# Patient Record
Sex: Male | Born: 1955
Health system: Southern US, Community
[De-identification: ages and names within clinical notes are randomized; demographics above are authoritative.]

## PROBLEM LIST (undated history)

## (undated) DIAGNOSIS — K219 Gastro-esophageal reflux disease without esophagitis: Secondary | ICD-10-CM

## (undated) DIAGNOSIS — C4491 Basal cell carcinoma of skin, unspecified: Secondary | ICD-10-CM

## (undated) DIAGNOSIS — M1990 Unspecified osteoarthritis, unspecified site: Secondary | ICD-10-CM

## (undated) DIAGNOSIS — E785 Hyperlipidemia, unspecified: Secondary | ICD-10-CM

## (undated) DIAGNOSIS — T7840XA Allergy, unspecified, initial encounter: Secondary | ICD-10-CM

## (undated) DIAGNOSIS — M109 Gout, unspecified: Secondary | ICD-10-CM

## (undated) HISTORY — DX: Gastro-esophageal reflux disease without esophagitis: K21.9

## (undated) HISTORY — PX: BASAL CELL CARCINOMA EXCISION: SHX1214

## (undated) HISTORY — DX: Basal cell carcinoma of skin, unspecified: C44.91

## (undated) HISTORY — PX: VASECTOMY: SHX75

## (undated) HISTORY — DX: Gout, unspecified: M10.9

## (undated) HISTORY — DX: Hyperlipidemia, unspecified: E78.5

## (undated) HISTORY — PX: APPENDECTOMY: SHX54

## (undated) HISTORY — DX: Allergy, unspecified, initial encounter: T78.40XA

## (undated) HISTORY — DX: Unspecified osteoarthritis, unspecified site: M19.90

## (undated) HISTORY — PX: CERVICAL DISC SURGERY: SHX588

## (undated) HISTORY — PX: TONSILLECTOMY: SUR1361

## (undated) HISTORY — PX: WISDOM TOOTH EXTRACTION: SHX21

---

## 1998-11-22 ENCOUNTER — Encounter: Payer: Self-pay | Admitting: *Deleted

## 1998-11-22 ENCOUNTER — Emergency Department (HOSPITAL_COMMUNITY): Admission: AD | Admit: 1998-11-22 | Discharge: 1998-11-22 | Payer: Self-pay | Admitting: *Deleted

## 2009-06-19 ENCOUNTER — Encounter: Admission: RE | Admit: 2009-06-19 | Discharge: 2009-06-19 | Payer: Self-pay | Admitting: Family Medicine

## 2009-07-02 ENCOUNTER — Ambulatory Visit (HOSPITAL_COMMUNITY): Admission: RE | Admit: 2009-07-02 | Discharge: 2009-07-03 | Payer: Self-pay | Admitting: Neurological Surgery

## 2010-09-05 ENCOUNTER — Encounter: Payer: Self-pay | Admitting: Family Medicine

## 2010-11-17 LAB — PROTIME-INR
INR: 0.93 (ref 0.00–1.49)
Prothrombin Time: 12.4 seconds (ref 11.6–15.2)

## 2010-11-17 LAB — BASIC METABOLIC PANEL
BUN: 13 mg/dL (ref 6–23)
CO2: 26 mEq/L (ref 19–32)
Calcium: 8.8 mg/dL (ref 8.4–10.5)
Chloride: 105 mEq/L (ref 96–112)
Creatinine, Ser: 0.84 mg/dL (ref 0.4–1.5)
GFR calc Af Amer: >60 mL/min (ref >60)
GFR calc non Af Amer: >60 mL/min (ref >60)
Glucose, Bld: 84 mg/dL (ref 70–99)
Potassium: 4.2 mEq/L (ref 3.5–5.1)
Sodium: 137 mEq/L (ref 135–145)

## 2010-11-17 LAB — CBC
HCT: 48.6 % (ref 39.0–52.0)
Hemoglobin: 16.4 g/dL (ref 13.0–17.0)
MCHC: 33.7 g/dL (ref 30.0–36.0)
MCV: 93.1 fL (ref 78.0–100.0)
Platelets: 315 10*3/uL (ref 150–400)
RBC: 5.23 MIL/uL (ref 4.22–5.81)
RDW: 14 % (ref 11.5–15.5)
WBC: 8.8 10*3/uL (ref 4.0–10.5)

## 2010-11-17 LAB — APTT: aPTT: 32 seconds (ref 24–37)

## 2016-08-05 ENCOUNTER — Encounter: Payer: Self-pay | Admitting: Family Medicine

## 2016-08-05 ENCOUNTER — Ambulatory Visit (INDEPENDENT_AMBULATORY_CARE_PROVIDER_SITE_OTHER): Payer: BLUE CROSS/BLUE SHIELD | Admitting: Family Medicine

## 2016-08-05 VITALS — BP 132/94 | HR 71 | Temp 98.0°F | Ht 69.0 in | Wt 190.2 lb

## 2016-08-05 DIAGNOSIS — Z889 Allergy status to unspecified drugs, medicaments and biological substances status: Secondary | ICD-10-CM

## 2016-08-05 DIAGNOSIS — G8929 Other chronic pain: Secondary | ICD-10-CM

## 2016-08-05 DIAGNOSIS — N529 Male erectile dysfunction, unspecified: Secondary | ICD-10-CM

## 2016-08-05 DIAGNOSIS — E785 Hyperlipidemia, unspecified: Secondary | ICD-10-CM

## 2016-08-05 DIAGNOSIS — Z7689 Persons encountering health services in other specified circumstances: Secondary | ICD-10-CM | POA: Diagnosis not present

## 2016-08-05 DIAGNOSIS — M25562 Pain in left knee: Secondary | ICD-10-CM

## 2016-08-05 DIAGNOSIS — K219 Gastro-esophageal reflux disease without esophagitis: Secondary | ICD-10-CM | POA: Diagnosis not present

## 2016-08-05 DIAGNOSIS — M25561 Pain in right knee: Secondary | ICD-10-CM

## 2016-08-05 DIAGNOSIS — M109 Gout, unspecified: Secondary | ICD-10-CM | POA: Diagnosis not present

## 2016-08-05 MED ORDER — SILDENAFIL CITRATE 20 MG PO TABS: ORAL_TABLET | ORAL | 0 refills | Status: DC

## 2016-08-05 NOTE — Progress Notes (Signed)
Pre visit review using our clinic review tool, if applicable. No additional management support is needed unless otherwise documented below in the visit note. 

## 2016-08-05 NOTE — Patient Instructions (Addendum)
BEFORE YOU LEAVE: -recheck blood pressure  -xray sheet -review health maintenance and obtain reports to abstract -follow up: physical in 3 months  I will send the refill to the drugstore in [place].  Get the xray of the knee.  Try topical capsaicin sports creams for the knee pain  It was nice to meet you today!

## 2016-08-05 NOTE — Progress Notes (Signed)
HPI:  New patient. Used to see a doc in [place] but his doc retired. Had labs about 8 months ago.   HLD: -takes simvastatin, takes only 3 days per week -no hx CAD, DM, HTN -regular exercise, healthy diet - walks 5 miles per day  GERD: -hx hiatal hernia -take pantoprazole 40mg   -feels bad if misses doses  Gout: -on allopurinol, no flare since started a long time ago  Allergies: -sees Dr. Remus Blake -on allergy shot -flonase and aller-tec  ED: -uses cialis 20mg  as needed -wants rx sent to [facility]  Bilat knee pain: -for several years -better with activity, occ feels swollen -tolerable, not worsening  ROS negative for unless reported above: fevers, unintentional weight loss, hearing or vision loss, chest pain, palpitations, struggling to breath, hemoptysis, melena, hematochezia, hematuria, falls, loc, si, thoughts of self harm  Past Medical History:  Diagnosis Date  . Allergy   . Gout   . Hyperlipidemia     Past Surgical History:  Procedure Laterality Date  . APPENDECTOMY    . APPENDECTOMY    . CERVICAL DISC SURGERY    . TONSILLECTOMY      Family History  Problem Relation Age of Onset  . Arthritis Mother   . Arthritis Father   . Heart disease Father   . Cancer Sister     ovarian  . Diabetes Sister   . Kidney disease Cousin     Social History   Social History  . Marital status: Married    Spouse name: N/A  . Number of children: N/A  . Years of education: N/A   Social History Main Topics  . Smoking status: Never Smoker  . Smokeless tobacco: Never Used  . Alcohol use None     Comment: 2 - 5 days per week  . Drug use: No  . Sexual activity: Yes    Partners: Female   Other Topics Concern  . None   Social History Narrative   Work or School: [employer], 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      Home Situation: lives with wife      Spiritual Beliefs: Catholic      Lifestyle: regular exercise and healthy diet        Current  Outpatient Prescriptions:  .  allopurinol (ZYLOPRIM) 300 MG tablet, TAKE ONE TABLET BY MOUTH DAILY AS DIRECTED, Disp: , Rfl:  .  Cetirizine HCl (KLS ALLER-TEC PO), Take by mouth., Disp: , Rfl:  .  fluticasone (FLONASE) 50 MCG/ACT nasal spray, , Disp: , Rfl:  .  pantoprazole (PROTONIX) 40 MG tablet, TAKE 1 TABLET DAILY., Disp: , Rfl:  .  simvastatin (ZOCOR) 40 MG tablet, TAKE 1 TABLET BY MOUTH EVERY EVENING, Disp: , Rfl:  .  tadalafil (CIALIS) 20 MG tablet, Take 20 mg by mouth daily as needed for erectile dysfunction., Disp: , Rfl:  .  sildenafil (REVATIO) 20 MG tablet, Take 1-4 tablets as needed for sexual activity., Disp: 50 tablet, Rfl: 0  EXAM:  Vitals:   08/05/16 1508 08/05/16 1549  BP: (!) 136/92 (!) 132/94  Pulse: 71   Temp: 98 F (36.7 C)     Body mass index is 28.09 kg/m.  GENERAL: vitals reviewed and listed above, alert, oriented, appears well hydrated and in no acute distress  HEENT: atraumatic, conjunttiva clear, no obvious abnormalities on inspection of external nose and ears  NECK: no obvious masses on inspection  LUNGS: clear to auscultation bilaterally, no wheezes, rales or rhonchi, good air  movement  CV: HRRR, no peripheral edema  MS: moves all extremities without noticeable abnormality -mentioned the knee pain at the end of the visit as something he wanted to evaluate at a later appt - did brief exam - no apparent effusion today, no redness or rash, bilat + pat crepitus, neg lachman, neg mcmurry, neg val/var stress, NV intact bilat  PSYCH: pleasant and cooperative, no obvious depression or anxiety  ASSESSMENT AND PLAN:  Discussed the following assessment and plan:  Encounter to establish care  Erectile dysfunction, unspecified erectile dysfunction type  Gastroesophageal reflux disease, esophagitis presence not specified  Gout, unspecified cause, unspecified chronicity, unspecified site  H/O seasonal allergies - sees Dr. Orvil Feil for shots and  management  Hyperlipidemia, unspecified hyperlipidemia type  Chronic pain of both knees - Plan: DG Knee Complete 4 Views Right -We reviewed the PMH, PSH, FH, SH, Meds and Allergies. -We provided refills for any medications we will prescribe as needed. -We addressed current concerns per orders and patient instructions. -We have asked for records for pertinent exams, studies, vaccines and notes from previous providers. -We have advised patient to follow up per instructions below. -refills per request -recheck bp as leaving he reports it always is normal -xrays knee - suspect OA, tx options discussed -labs at physical per his preference -trial lower dose ppi if tolerated -Patient advised to return or notify a doctor immediately if symptoms worsen or persist or new concerns arise.  Patient Instructions  BEFORE YOU LEAVE: -recheck blood pressure  -xray sheet -review health maintenance and obtain reports to abstract -follow up: physical in 3 months  I will send the refill to the drugstore in [place].  Get the xray of the knee.  Try topical capsaicin sports creams for the knee pain  It was nice to meet you today!       Colin Benton R.

## 2016-08-07 DIAGNOSIS — G8929 Other chronic pain: Secondary | ICD-10-CM | POA: Insufficient documentation

## 2016-08-07 DIAGNOSIS — M109 Gout, unspecified: Secondary | ICD-10-CM | POA: Insufficient documentation

## 2016-08-07 DIAGNOSIS — Z889 Allergy status to unspecified drugs, medicaments and biological substances status: Secondary | ICD-10-CM | POA: Insufficient documentation

## 2016-08-07 DIAGNOSIS — M25561 Pain in right knee: Secondary | ICD-10-CM

## 2016-08-07 DIAGNOSIS — E785 Hyperlipidemia, unspecified: Secondary | ICD-10-CM | POA: Insufficient documentation

## 2016-08-07 DIAGNOSIS — M25562 Pain in left knee: Secondary | ICD-10-CM

## 2016-08-12 ENCOUNTER — Ambulatory Visit (INDEPENDENT_AMBULATORY_CARE_PROVIDER_SITE_OTHER)
Admission: RE | Admit: 2016-08-12 | Discharge: 2016-08-12 | Disposition: A | Discharge status: Home / Self Care | Payer: BLUE CROSS/BLUE SHIELD | Source: Ambulatory Visit | Attending: Family Medicine | Admitting: Family Medicine

## 2016-08-12 DIAGNOSIS — M25561 Pain in right knee: Secondary | ICD-10-CM

## 2016-08-12 DIAGNOSIS — M25562 Pain in left knee: Secondary | ICD-10-CM | POA: Diagnosis not present

## 2016-08-12 DIAGNOSIS — G8929 Other chronic pain: Secondary | ICD-10-CM

## 2016-08-18 ENCOUNTER — Encounter: Payer: Self-pay | Admitting: *Deleted

## 2016-08-19 ENCOUNTER — Ambulatory Visit: Payer: BLUE CROSS/BLUE SHIELD

## 2016-08-26 ENCOUNTER — Ambulatory Visit: Payer: BLUE CROSS/BLUE SHIELD | Admitting: *Deleted

## 2016-08-26 DIAGNOSIS — J301 Allergic rhinitis due to pollen: Secondary | ICD-10-CM | POA: Diagnosis not present

## 2016-08-26 DIAGNOSIS — J3081 Allergic rhinitis due to animal (cat) (dog) hair and dander: Secondary | ICD-10-CM | POA: Diagnosis not present

## 2016-08-26 NOTE — Progress Notes (Deleted)
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  BP reading was:  122/80-pulse 66 and the pt stated he has been checking this at work with numbers ranging 108/70. Dr Maudie Mercury was informed of this and I advised the pt to continue what he has been doing and follow up for his physical with is scheduled in 3 months.

## 2016-08-29 ENCOUNTER — Other Ambulatory Visit: Payer: Self-pay | Admitting: *Deleted

## 2016-08-29 DIAGNOSIS — J3089 Other allergic rhinitis: Secondary | ICD-10-CM | POA: Diagnosis not present

## 2016-08-29 MED ORDER — PANTOPRAZOLE SODIUM 40 MG PO TBEC: 40.0000 mg | DELAYED_RELEASE_TABLET | Freq: Every day | ORAL | 1 refills | Status: DC

## 2016-09-06 ENCOUNTER — Other Ambulatory Visit: Payer: Self-pay | Admitting: *Deleted

## 2016-09-06 MED ORDER — ALLOPURINOL 300 MG PO TABS: 300.0000 mg | ORAL_TABLET | Freq: Every day | ORAL | 3 refills | Status: DC

## 2016-09-06 MED ORDER — TADALAFIL 20 MG PO TABS: 20.0000 mg | ORAL_TABLET | Freq: Every day | ORAL | 3 refills | Status: DC | PRN

## 2016-09-21 ENCOUNTER — Encounter: Payer: Self-pay | Admitting: Family Medicine

## 2016-09-21 ENCOUNTER — Ambulatory Visit (INDEPENDENT_AMBULATORY_CARE_PROVIDER_SITE_OTHER): Payer: BLUE CROSS/BLUE SHIELD | Admitting: Family Medicine

## 2016-09-21 VITALS — BP 110/78 | HR 73 | Temp 97.9°F | Ht 69.0 in | Wt 192.0 lb

## 2016-09-21 DIAGNOSIS — M791 Myalgia, unspecified site: Secondary | ICD-10-CM

## 2016-09-21 DIAGNOSIS — J101 Influenza due to other identified influenza virus with other respiratory manifestations: Secondary | ICD-10-CM

## 2016-09-21 LAB — POCT INFLUENZA A/B
Influenza A, POC: POSITIVE — AB
Influenza B, POC: NEGATIVE

## 2016-09-21 MED ORDER — OSELTAMIVIR PHOSPHATE 75 MG PO CAPS: 75.0000 mg | ORAL_CAPSULE | Freq: Two times a day (BID) | ORAL | 0 refills | Status: DC

## 2016-09-21 NOTE — Progress Notes (Signed)
Subjective:     Patient ID: Stanley Bennett, male   DOB: 1956-06-28, 61 y.o.   MRN: [id number]  HPI Acute visit for flulike symptoms. He travels and was up in [place] last weekend. Late Sunday he developed some low-grade fever, nasal congestion, sore throat, ear fullness, cough, headaches, and body aches. No nausea or vomiting. Fevers are intermittent.  Past Medical History:  Diagnosis Date  . Allergy   . Gout   . Hyperlipidemia    Past Surgical History:  Procedure Laterality Date  . APPENDECTOMY    . APPENDECTOMY    . CERVICAL DISC SURGERY    . TONSILLECTOMY      reports that he has never smoked. He has never used smokeless tobacco. He reports that he does not use drugs. His alcohol history is not on file. family history includes Arthritis in his father and mother; Cancer in his sister; Diabetes in his sister; Heart disease in his father; Kidney disease in his cousin. No Known Allergies   Review of Systems  Constitutional: Positive for chills, fatigue and fever.  HENT: Positive for congestion and sore throat.   Respiratory: Positive for cough.   Gastrointestinal: Negative for nausea and vomiting.  Neurological: Positive for headaches.       Objective:   Physical Exam  Constitutional: He appears well-developed and well-nourished.  HENT:  Right Ear: External ear normal.  Left Ear: External ear normal.  Mouth/Throat: Oropharynx is clear and moist. No oropharyngeal exudate.  Neck: Neck supple.  Cardiovascular: Normal rate and regular rhythm.   Pulmonary/Chest: Effort normal and breath sounds normal. No respiratory distress. He has no wheezes. He has no rales.  Lymphadenopathy:    He has no cervical adenopathy.       Assessment:     Febrile illness. Suspect influenza. Patient requesting testing=positive for Influenza A    Plan:     -Tamiflu 75 mg po bid for 5 days -push fluids -follow up for any persistent or worsening symptoms.  Eulas Post MD [place]  [facility] at [facility]

## 2016-09-21 NOTE — Progress Notes (Signed)
Pre visit review using our clinic review tool, if applicable. No additional management support is needed unless otherwise documented below in the visit note. 

## 2016-09-21 NOTE — Patient Instructions (Signed)

## 2016-09-26 ENCOUNTER — Ambulatory Visit (INDEPENDENT_AMBULATORY_CARE_PROVIDER_SITE_OTHER): Payer: BLUE CROSS/BLUE SHIELD | Admitting: Family Medicine

## 2016-09-26 ENCOUNTER — Encounter: Payer: Self-pay | Admitting: Family Medicine

## 2016-09-26 VITALS — BP 118/80 | HR 60 | Temp 97.7°F | Ht 69.0 in | Wt 184.0 lb

## 2016-09-26 DIAGNOSIS — R21 Rash and other nonspecific skin eruption: Secondary | ICD-10-CM

## 2016-09-26 NOTE — Progress Notes (Signed)
HPI:  Acute visit for rash. Dx with influenza last week and took tamiflu. Rash started after 2 days on tamiflu - itchy rash on trunk. Reports otherwise feeling much better with resolution fevers, malaise, body aches. No SOB, wheezing, mouth or face swelling. Rash not worsening.  ROS: See pertinent positives and negatives per HPI.  Past Medical History:  Diagnosis Date  . Allergy   . Gout   . Hyperlipidemia     Past Surgical History:  Procedure Laterality Date  . APPENDECTOMY    . APPENDECTOMY    . CERVICAL DISC SURGERY    . TONSILLECTOMY      Family History  Problem Relation Age of Onset  . Arthritis Mother   . Arthritis Father   . Heart disease Father   . Cancer Sister     ovarian  . Diabetes Sister   . Kidney disease Cousin     Social History   Social History  . Marital status: Married    Spouse name: N/A  . Number of children: N/A  . Years of education: N/A   Social History Main Topics  . Smoking status: Never Smoker  . Smokeless tobacco: Never Used  . Alcohol use None     Comment: 2 - 5 days per week  . Drug use: No  . Sexual activity: Yes    Partners: Female   Other Topics Concern  . None   Social History Narrative   Work or School: [employer], 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      Home Situation: lives with wife      Spiritual Beliefs: Catholic      Lifestyle: regular exercise and healthy diet        Current Outpatient Prescriptions:  .  allopurinol (ZYLOPRIM) 300 MG tablet, Take 1 tablet (300 mg total) by mouth daily. as directed, Disp: 90 tablet, Rfl: 3 .  Cetirizine HCl (KLS ALLER-TEC PO), Take by mouth., Disp: , Rfl:  .  fluticasone (FLONASE) 50 MCG/ACT nasal spray, , Disp: , Rfl:  .  pantoprazole (PROTONIX) 40 MG tablet, Take 1 tablet (40 mg total) by mouth daily., Disp: 90 tablet, Rfl: 1 .  sildenafil (REVATIO) 20 MG tablet, Take 1-4 tablets as needed for sexual activity., Disp: 50 tablet, Rfl: 0 .  simvastatin (ZOCOR) 40 MG  tablet, TAKE 1 TABLET BY MOUTH EVERY EVENING, Disp: , Rfl:  .  tadalafil (CIALIS) 20 MG tablet, Take 1 tablet (20 mg total) by mouth daily as needed for erectile dysfunction., Disp: 10 tablet, Rfl: 3  EXAM:  Vitals:   09/26/16 1454  BP: 118/80  Pulse: 60  Temp: 97.7 F (36.5 C)    Body mass index is 27.17 kg/m.  GENERAL: vitals reviewed and listed above, alert, oriented, appears well hydrated and in no acute distress  HEENT: atraumatic, conjunttiva clear, no obvious abnormalities on inspection of external nose and ears  NECK: no obvious masses on inspection  LUNGS: clear to auscultation bilaterally, no wheezes, rales or rhonchi, good air movement  CV: HRRR, no peripheral edema  SKIN: mild papular erythematous rash on trunk only - no vesicles or peeling  MS: moves all extremities without noticeable abnormality  PSYCH: pleasant and cooperative, no obvious depression or anxiety  ASSESSMENT AND PLAN:  Discussed the following assessment and plan:  Rash  -given mild skin findings and feeling well otherwise suspect mild drug reaction and opted to stop tamiflu as has only one more dose and monitor -zyrtec daily -Patient advised to  return or notify a doctor immediately if symptoms worsen or persist or new concerns arise.  There are no Patient Instructions on file for this visit.  Colin Benton R., DO

## 2016-09-26 NOTE — Progress Notes (Signed)
Pre visit review using our clinic review tool, if applicable. No additional management support is needed unless otherwise documented below in the visit note. 

## 2016-09-29 ENCOUNTER — Encounter: Payer: Self-pay | Admitting: Family Medicine

## 2016-09-29 ENCOUNTER — Ambulatory Visit (INDEPENDENT_AMBULATORY_CARE_PROVIDER_SITE_OTHER): Payer: BLUE CROSS/BLUE SHIELD | Admitting: Family Medicine

## 2016-09-29 VITALS — BP 100/70 | HR 62 | Temp 97.4°F | Ht 69.0 in | Wt 185.1 lb

## 2016-09-29 DIAGNOSIS — M25561 Pain in right knee: Secondary | ICD-10-CM | POA: Diagnosis not present

## 2016-09-29 MED ORDER — NAPROXEN 500 MG PO TABS: 500.0000 mg | ORAL_TABLET | Freq: Two times a day (BID) | ORAL | 0 refills | Status: DC

## 2016-09-29 NOTE — Progress Notes (Signed)
Pre visit review using our clinic review tool, if applicable. No additional management support is needed unless otherwise documented below in the visit note. 

## 2016-09-29 NOTE — Progress Notes (Signed)
HPI:  R knee pain: -PMH sig for chronic knee issues an dgout -worsening R knee pain for 5 days  -xrays about 1 month ago -no swelling, redness, fevers, malaise, weakness, popping or giving away  ROS: See pertinent positives and negatives per HPI.  Past Medical History:  Diagnosis Date  . Allergy   . Gout   . Hyperlipidemia     Past Surgical History:  Procedure Laterality Date  . APPENDECTOMY    . APPENDECTOMY    . CERVICAL DISC SURGERY    . TONSILLECTOMY      Family History  Problem Relation Age of Onset  . Arthritis Mother   . Arthritis Father   . Heart disease Father   . Cancer Sister     ovarian  . Diabetes Sister   . Kidney disease Cousin     Social History   Social History  . Marital status: Married    Spouse name: N/A  . Number of children: N/A  . Years of education: N/A   Social History Main Topics  . Smoking status: Never Smoker  . Smokeless tobacco: Never Used  . Alcohol use None     Comment: 2 - 5 days per week  . Drug use: No  . Sexual activity: Yes    Partners: Female   Other Topics Concern  . None   Social History Narrative   Work or School: [employer], 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      Home Situation: lives with wife      Spiritual Beliefs: Catholic      Lifestyle: regular exercise and healthy diet        Current Outpatient Prescriptions:  .  allopurinol (ZYLOPRIM) 300 MG tablet, Take 1 tablet (300 mg total) by mouth daily. as directed, Disp: 90 tablet, Rfl: 3 .  Cetirizine HCl (KLS ALLER-TEC PO), Take by mouth., Disp: , Rfl:  .  fluticasone (FLONASE) 50 MCG/ACT nasal spray, , Disp: , Rfl:  .  pantoprazole (PROTONIX) 40 MG tablet, Take 1 tablet (40 mg total) by mouth daily., Disp: 90 tablet, Rfl: 1 .  sildenafil (REVATIO) 20 MG tablet, Take 1-4 tablets as needed for sexual activity., Disp: 50 tablet, Rfl: 0 .  simvastatin (ZOCOR) 40 MG tablet, TAKE 1 TABLET BY MOUTH EVERY EVENING, Disp: , Rfl:  .  tadalafil (CIALIS) 20  MG tablet, Take 1 tablet (20 mg total) by mouth daily as needed for erectile dysfunction., Disp: 10 tablet, Rfl: 3 .  naproxen (NAPROSYN) 500 MG tablet, Take 1 tablet (500 mg total) by mouth 2 (two) times daily with a meal., Disp: 14 tablet, Rfl: 0  EXAM:  Vitals:   09/29/16 1122  BP: 100/70  Pulse: 62  Temp: 97.4 F (36.3 C)    Body mass index is 27.33 kg/m.  GENERAL: vitals reviewed and listed above, alert, oriented, appears well hydrated and in no acute distress  HEENT: atraumatic, conjunttiva clear, no obvious abnormalities on inspection of external nose and ears  NECK: no obvious masses on inspection  LUNGS: clear to auscultation bilaterally, no wheezes, rales or rhonchi, good air movement  CV: HRRR, no peripheral edema  MS: moves all extremities without noticeable abnormality, normal inspection both knees without redness or swelling, normal gait and normal ROM both knees, TTP is very focal and over R MCL, no laxity on val/var testing, lachman and mcmurray neg, normal exam otherwise and NV intact distal  PSYCH: pleasant and cooperative, no obvious depression or anxiety  ASSESSMENT AND  PLAN:  Discussed the following assessment and plan:  Recurrent pain of right knee  -we discussed possible serious and likely etiologies, workup and treatment, treatment risks and return precautions -after this discussion, Javien opted for NSAIDs, ice and close monitoring; eval with ortho urgent care (options discussed) if worsening and with sports med or ortho if persists -of course, we advised Quinnten  to return or notify a doctor immediately if symptoms worsen or persist or new concerns arise.  Patient Instructions  Ice  Naproxen per instructions as needed for pain  Referral to sports med or orthopedic walk in clinic evaluation if worsening or not improving next 7 days.   Colin Benton R., DO

## 2016-09-29 NOTE — Patient Instructions (Signed)
Ice  Naproxen per instructions as needed for pain  Referral to sports med or orthopedic walk in clinic evaluation if worsening or not improving next 7 days.

## 2016-10-06 DIAGNOSIS — J3081 Allergic rhinitis due to animal (cat) (dog) hair and dander: Secondary | ICD-10-CM | POA: Diagnosis not present

## 2016-10-06 DIAGNOSIS — J301 Allergic rhinitis due to pollen: Secondary | ICD-10-CM | POA: Diagnosis not present

## 2016-10-06 DIAGNOSIS — J3089 Other allergic rhinitis: Secondary | ICD-10-CM | POA: Diagnosis not present

## 2016-10-06 DIAGNOSIS — L209 Atopic dermatitis, unspecified: Secondary | ICD-10-CM | POA: Diagnosis not present

## 2016-11-15 ENCOUNTER — Encounter: Payer: BLUE CROSS/BLUE SHIELD | Admitting: Family Medicine

## 2016-12-05 NOTE — Progress Notes (Signed)
HPI:  Here for CPE:  New concern Vertigo: -intermittent for about 3 months -thinks had prior episodes -brief spells triggered by position changes -no fevers, HA, malaise, wt loss, hearing changes, vision changes, vomiting, CP, palpitations  -Concerns and/or follow up today:   HLD: -takes simvastatin, takes only 3 days per week -no hx CAD, DM, HTN -regular exercise, healthy diet - walks 5 miles per day  GERD: -hx hiatal hernia -take pantoprazole 40mg   -feels bad if misses doses  Gout: -on allopurinol, no flare since started a long time ago -needs refills  Allergies: -sees Dr. Remus Blake -on allergy shot -flonase and aller-tec  ED: -uses cialis 20mg  as needed  Bilat knee pain: -for several years -better with activity, occ feels swollen -tolerable, not worsening  -Diet: variety of foods, balance and well rounded, larger portion sizes  -Exercise: no regular exercise  -Diabetes and Dyslipidemia Screening: FASTING for labs  -Hx of HTN: no  -Vaccines: UTD  -sexual activity: yes, male partner, no new partners  -wants STI testing, Hep C screening (if born 81-1967): no  -FH colon or prstate ca: see FH Last colon cancer screening: discussed options, reports normal colonoscopy at 76, wants to do cologard Last prostate ca screening: discussed risks/benefits DRE and PSA, he declined DRE but does wish to do the PSA  -Alcohol, Tobacco, drug use: see social history  Review of Systems - no fevers, unintentional weight loss, vision loss, hearing loss, chest pain, sob, hemoptysis, melena, hematochezia, hematuria, genital discharge, changing or concerning skin lesions, bleeding, bruising, loc, thoughts of self harm or SI  Past Medical History:  Diagnosis Date  . Allergy   . Gout   . Hyperlipidemia     Past Surgical History:  Procedure Laterality Date  . APPENDECTOMY    . APPENDECTOMY    . CERVICAL DISC SURGERY    . TONSILLECTOMY      Family History   Problem Relation Age of Onset  . Arthritis Mother   . Arthritis Father   . Heart disease Father   . Cancer Sister     ovarian  . Diabetes Sister   . Kidney disease Cousin     Social History   Social History  . Marital status: Married    Spouse name: N/A  . Number of children: N/A  . Years of education: N/A   Social History Main Topics  . Smoking status: Never Smoker  . Smokeless tobacco: Never Used  . Alcohol use None     Comment: 2 - 5 days per week  . Drug use: No  . Sexual activity: Yes    Partners: Female   Other Topics Concern  . None   Social History Narrative   Work or School: [employer], 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      Home Situation: lives with wife      Spiritual Beliefs: Catholic      Lifestyle: regular exercise and healthy diet        Current Outpatient Prescriptions:  .  allopurinol (ZYLOPRIM) 300 MG tablet, Take 1 tablet (300 mg total) by mouth daily. as directed, Disp: 90 tablet, Rfl: 3 .  Cetirizine HCl (KLS ALLER-TEC PO), Take by mouth., Disp: , Rfl:  .  fluticasone (FLONASE) 50 MCG/ACT nasal spray, , Disp: , Rfl:  .  naproxen (NAPROSYN) 500 MG tablet, Take 1 tablet (500 mg total) by mouth 2 (two) times daily with a meal., Disp: 14 tablet, Rfl: 0 .  pantoprazole (PROTONIX) 40 MG tablet,  Take 1 tablet (40 mg total) by mouth daily., Disp: 90 tablet, Rfl: 1 .  sildenafil (REVATIO) 20 MG tablet, Take 1-4 tablets as needed for sexual activity., Disp: 50 tablet, Rfl: 0 .  simvastatin (ZOCOR) 40 MG tablet, TAKE 1 TABLET BY MOUTH EVERY EVENING, Disp: , Rfl:  .  tadalafil (CIALIS) 20 MG tablet, Take 1 tablet (20 mg total) by mouth daily as needed for erectile dysfunction., Disp: 10 tablet, Rfl: 3  EXAM:  Vitals:   12/06/16 1127  BP: 120/80  Pulse: 60  Temp: 97.5 F (36.4 C)  TempSrc: Oral  Weight: 183 lb 8 oz (83.2 kg)  Height: 5' 9.25" (1.759 m)    Estimated body mass index is 26.9 kg/m as calculated from the following:   Height as  of this encounter: 5' 9.25" (1.759 m).   Weight as of this encounter: 183 lb 8 oz (83.2 kg).  GENERAL: vitals reviewed and listed below, alert, oriented, appears well hydrated and in no acute distress  HEENT: head atraumatic, PERRLA, normal appearance of eyes, ears, nose and mouth. moist mucus membranes.  NECK: supple, no masses or lymphadenopathy, no bruit  LUNGS: clear to auscultation bilaterally, no rales, rhonchi or wheeze  CV: HRRR, no peripheral edema or cyanosis, normal pedal pulses  ABDOMEN: bowel sounds normal, soft, non tender to palpation, no masses, no rebound or guarding  GU: normal appearance of external genitalia - no lesions or masses, hernia exam normal.  RECTAL: refused  SKIN: no rash or abnormal lesions  MS: normal gait, moves all extremities normally  NEURO: normal gait, speech and thought processing grossly intact, muscle tone grossly intact throughout, CN II-XII grossly intact, finger to nose normal, speech and thought processing grossly intact, dix hallpike + to the R  PSYCH: normal affect, pleasant and cooperative  ASSESSMENT AND PLAN:  Discussed the following assessment and plan:  Visit for preventive health examination - Plan: PSA, Hemoglobin U2P, Basic metabolic panel  Vertigo - Plan: meclizine (ANTIVERT) 25 MG tablet, Ambulatory referral to Physical Therapy -we discussed possible serious and likely etiologies, workup and treatment, treatment risks and return precautions; query BPPV -after this discussion, Ervine opted for meclizine, vestibular rehab -follow up advised 1 month with further eval if not resolving as expected with appropriate tx -of course, we advised Styles  to return or notify a doctor immediately if symptoms worsen or persist or new concerns arise.  Chronic pain of both knees - Plan: naproxen (NAPROSYN) 500 MG tablet -requested refill on naproxen, uses rarely for knee pain  Hyperlipidemia, unspecified hyperlipidemia type - Plan:  Lipid panel Labs, lifestyle recs, cont current tx  Gastroesophageal reflux disease, esophagitis presence not specified -cont current tx  Gout, unspecified cause, unspecified chronicity, unspecified site -refills, labs, cont current tx  hep c screening - Plan: Hepatitis C antibody   He had a new concern that required sig time and exam above and beyond physical, prescription medication and referral.  -Discussed and advised all [place] preventive services health task force level A and B recommendations for age, sex and risks.  --Advised at least 150 minutes of exercise per week and a healthy diet with avoidance of (less then 1 serving per week) processed foods, white starches, red meat, fast foods and sweets and consisting of: * 5-9 servings of fresh fruits and vegetables (not corn or potatoes) *nuts and seeds, beans *olives and olive oil *lean meats such as fish and white chicken  *whole grains  -FASTING labs, studies and vaccines per  orders this encounter  -assistant advised to order cologard for colon ca screening per his preference  -risks/benefits options for prostate ca screening discussed, he opted for PSA    Patient advised to return to clinic immediately if symptoms worsen or persist or new concerns.  There are no Patient Instructions on file for this visit.  No Follow-up on file.   Colin Benton R., DO

## 2016-12-06 ENCOUNTER — Telehealth: Payer: Self-pay | Admitting: Family Medicine

## 2016-12-06 ENCOUNTER — Ambulatory Visit (INDEPENDENT_AMBULATORY_CARE_PROVIDER_SITE_OTHER): Payer: BLUE CROSS/BLUE SHIELD | Admitting: Family Medicine

## 2016-12-06 ENCOUNTER — Encounter: Payer: Self-pay | Admitting: Family Medicine

## 2016-12-06 VITALS — BP 120/80 | HR 60 | Temp 97.5°F | Ht 69.25 in | Wt 183.5 lb

## 2016-12-06 DIAGNOSIS — R42 Dizziness and giddiness: Secondary | ICD-10-CM

## 2016-12-06 DIAGNOSIS — Z0000 Encounter for general adult medical examination without abnormal findings: Secondary | ICD-10-CM

## 2016-12-06 DIAGNOSIS — Z7289 Other problems related to lifestyle: Secondary | ICD-10-CM | POA: Diagnosis not present

## 2016-12-06 DIAGNOSIS — G8929 Other chronic pain: Secondary | ICD-10-CM | POA: Diagnosis not present

## 2016-12-06 DIAGNOSIS — M25562 Pain in left knee: Secondary | ICD-10-CM

## 2016-12-06 DIAGNOSIS — K219 Gastro-esophageal reflux disease without esophagitis: Secondary | ICD-10-CM | POA: Diagnosis not present

## 2016-12-06 DIAGNOSIS — M25561 Pain in right knee: Secondary | ICD-10-CM | POA: Diagnosis not present

## 2016-12-06 DIAGNOSIS — E785 Hyperlipidemia, unspecified: Secondary | ICD-10-CM | POA: Diagnosis not present

## 2016-12-06 DIAGNOSIS — M109 Gout, unspecified: Secondary | ICD-10-CM

## 2016-12-06 LAB — LIPID PANEL
Cholesterol: 203 mg/dL — ABNORMAL HIGH (ref 0–200)
HDL: 74.5 mg/dL (ref >39.00)
LDL Cholesterol: 111 mg/dL — ABNORMAL HIGH (ref 0–99)
NonHDL: 128.45
Total CHOL/HDL Ratio: 3
Triglycerides: 87 mg/dL (ref 0.0–149.0)
VLDL: 17.4 mg/dL (ref 0.0–40.0)

## 2016-12-06 LAB — BASIC METABOLIC PANEL
BUN: 13 mg/dL (ref 6–23)
CO2: 28 mEq/L (ref 19–32)
Calcium: 9.2 mg/dL (ref 8.4–10.5)
Chloride: 101 mEq/L (ref 96–112)
Creatinine, Ser: 0.95 mg/dL (ref 0.40–1.50)
GFR: 85.23 mL/min (ref >60.00)
Glucose, Bld: 82 mg/dL (ref 70–99)
Potassium: 4.3 mEq/L (ref 3.5–5.1)
Sodium: 137 mEq/L (ref 135–145)

## 2016-12-06 LAB — HEMOGLOBIN A1C: Hgb A1c MFr Bld: 6.1 % (ref 4.6–6.5)

## 2016-12-06 LAB — PSA: PSA: 1.57 ng/mL (ref 0.10–4.00)

## 2016-12-06 MED ORDER — ALLOPURINOL 300 MG PO TABS: 300.0000 mg | ORAL_TABLET | Freq: Every day | ORAL | 3 refills | Status: DC

## 2016-12-06 MED ORDER — PANTOPRAZOLE SODIUM 40 MG PO TBEC: 40.0000 mg | DELAYED_RELEASE_TABLET | Freq: Every day | ORAL | 3 refills | Status: DC

## 2016-12-06 MED ORDER — SIMVASTATIN 40 MG PO TABS: 40.0000 mg | ORAL_TABLET | Freq: Every evening | ORAL | 3 refills | Status: DC

## 2016-12-06 MED ORDER — MECLIZINE HCL 25 MG PO TABS: 25.0000 mg | ORAL_TABLET | Freq: Three times a day (TID) | ORAL | 0 refills | Status: DC | PRN

## 2016-12-06 MED ORDER — NAPROXEN 500 MG PO TABS: 500.0000 mg | ORAL_TABLET | Freq: Two times a day (BID) | ORAL | 0 refills | Status: DC

## 2016-12-06 NOTE — Telephone Encounter (Signed)
Rx done. 

## 2016-12-06 NOTE — Telephone Encounter (Signed)
Pt called back and do not need the allopurinol he needs Rx for pantoprazole #90   Pharm:  [facility] on [address]

## 2016-12-06 NOTE — Patient Instructions (Addendum)
BEFORE YOU LEAVE: -follow up: 1 month for vertigo -order cologard -labs  Meclizine as needed per instruction for vertigo. We placed a referral for you as discussed for the therapy for the vertigo. It usually takes about 1-2 weeks to process and schedule this referral. If you have not heard from [place] regarding this appointment in 2 weeks please contact our office. Do not drive when you have vertigo.  We have ordered labs or studies at this visit. It can take up to 1-2 weeks for results and processing. IF results require follow up or explanation, we will call you with instructions. Clinically stable results will be released to your [facility]. If you have not heard from [place] or cannot find your results in [facility] in 2 weeks please contact our office at [phone].  If you are not yet signed up for [facility], please consider signing up.   We recommend the following healthy lifestyle for LIFE: 1) Small portions.   Tip: eat off of a salad plate instead of a dinner plate.  Tip: if you need more or a snack choose fruits, veggies and/or a handful of nuts or seeds.  2) Eat a healthy clean diet.  * Tip: Avoid (less then 1 serving per week): processed foods, sweets, sweetened drinks, white starches (rice, flour, bread, potatoes, pasta, etc), red meat, fast foods, butter  *Tip: CHOOSE instead   * 5-9 servings per day of fresh or frozen fruits and vegetables (but not corn, potatoes, bananas, canned or dried fruit)   *nuts and seeds, beans   *olives and olive oil   *small portions of lean meats such as fish and white chicken    *small portions of whole grains  3)Get at least 150 minutes of sweaty aerobic exercise per week.  4)Reduce stress - consider counseling, meditation and relaxation to balance other aspects of your life.

## 2016-12-06 NOTE — Progress Notes (Signed)
Pre visit review using our clinic review tool, if applicable. No additional management support is needed unless otherwise documented below in the visit note. 

## 2016-12-07 LAB — HEPATITIS C ANTIBODY: HCV Ab: NEGATIVE

## 2016-12-19 ENCOUNTER — Ambulatory Visit
Payer: BLUE CROSS/BLUE SHIELD | Attending: Family Medicine | Admitting: Rehabilitative and Restorative Service Providers"

## 2016-12-19 DIAGNOSIS — H8111 Benign paroxysmal vertigo, right ear: Secondary | ICD-10-CM

## 2016-12-19 NOTE — Therapy (Signed)
[facility] [address] [place], [place], [address] Phone: [phone]   Fax:  [phone]  Physical Therapy Evaluation  Patient Details  Name: Stanley Bennett MRN: [id number] Date of Birth: 1955/11/07 Referring Provider: Colin Benton, DO  Encounter Date: 12/19/2016      PT End of Session - 12/19/16 1309    Visit Number 1   Number of Visits 4   Date for PT Re-Evaluation 01/18/17   Authorization Type private insurance   PT Start Time 5176   PT Stop Time 1315   PT Time Calculation (min) 40 min   Activity Tolerance Patient tolerated treatment well   Behavior During Therapy [facility] for tasks assessed/performed      Past Medical History:  Diagnosis Date  . Allergy   . Gout   . Hyperlipidemia     Past Surgical History:  Procedure Laterality Date  . APPENDECTOMY    . APPENDECTOMY    . CERVICAL DISC SURGERY    . TONSILLECTOMY      There were no vitals filed for this visit.       Subjective Assessment - 12/19/16 1239    Subjective The patient noted onset of dizziness in 2010 after neck surgery.  He reports he has intermittent episodes that typically resolve on their own.  He describes dizziness when looking up or having to put his head back (painting in a small space).  This episode began February and he limited mobility for a day or two.  "It doesn't bother me when I walk unless I turn quickly."   He describes a quick spinning sensation with nausea.     Pertinent History h/o neck surgery   Patient Stated Goals Get rid of vertigo.   Currently in Pain? Yes  intermittent pains in neck and back   Effect of Pain on Daily Activities The patient has h/o chronic pain that PT will monitor, but no goals to follow.            [facility] PT Assessment - 12/19/16 1243      Assessment   Medical Diagnosis vertigo   Referring Provider Colin Benton, DO   Onset Date/Surgical Date --  09/2016   Prior Therapy none     Precautions   Precaution Comments h/o cervical surgery     Restrictions   Weight Bearing Restrictions No     Balance Screen   Has the patient fallen in the past 6 months No   Has the patient had a decrease in activity level because of a fear of falling?  No   Is the patient reluctant to leave their home because of a fear of falling?  No     Home Environment   Living Environment Private residence     Prior Function   Level of Independence Independent     Observation/Other Assessments   Focus on Therapeutic Outcomes (FOTO)  86%   Other Surveys  Other Surveys   Dizziness Handicap Inventory [facility])  30%            Vestibular Assessment - 12/19/16 1244      Vestibular Assessment   General Observation The patient walks independently into clinic.     Symptom Behavior   Type of Dizziness Spinning   Frequency of Dizziness daily   Duration of Dizziness seconds   Aggravating Factors Looking up to the ceiling   Relieving Factors Head stationary     Occulomotor Exam   Occulomotor Alignment Normal  Spontaneous Absent   Gaze-induced Absent   Smooth Pursuits Intact     Vestibulo-Occular Reflex   VOR 1 Head Only (x 1 viewing) normal with patient able to maintain gaze fixation     Positional Testing   Dix-Hallpike Dix-Hallpike Right;Dix-Hallpike Left   Horizontal Canal Testing Horizontal Canal Right;Horizontal Canal Left     Dix-Hallpike Right   Dix-Hallpike Right Duration 20 seconds   Dix-Hallpike Right Symptoms Upbeat, right rotatory nystagmus  large amplitude nystagmus     Dix-Hallpike Left   Dix-Hallpike Left Duration mild/ lasts for 10 seconds   Dix-Hallpike Left Symptoms Upbeat, left rotatory nystagmus     Horizontal Canal Right   Horizontal Canal Right Duration viewed upbeating, rotary nystagmus from posterior canal BPPV     Horizontal Canal Left   Horizontal Canal Left Duration none   Horizontal Canal Left Symptoms Normal                Vestibular  Treatment/Exercise - 12/19/16 1301      Vestibular Treatment/Exercise   Vestibular Treatment Provided Canalith Repositioning;Habituation   Canalith Repositioning Epley Manuever Right;Canal Roll Right   Habituation Exercises Horizontal Roll      EPLEY MANUEVER RIGHT   Number of Reps  1   Overall Response Symptoms Worsened   Response Details  Nystagmus converted to geotropic indicating otoconia repositioned into horiz canal     Canal Roll Right   Number of Reps  1   Overall Response  Improved Symptoms   Response Details  Patient continued with geotropic nystagmus, but smaller amplitude.  PT instructed in HEP for habituation due to continued symptoms (to begin in 2 days if symptoms present)     Horizontal Roll   Symptom Description  Provided handout and picture for home.               PT Education - 12/19/16 1459    Education provided Yes   Education Details HEP: habituation to begin in 2 days; handout from [email or web address]   Person(s) Educated Patient   Methods Explanation;Demonstration   Comprehension Verbalized understanding             PT Long Term Goals - 12/19/16 1310      PT LONG TERM GOAL #1   Title The patient will be indep with HEP for self mgmt of BPPV.   Baseline Target date 01/19/17   Time 4   Period Weeks     PT LONG TERM GOAL #2   Title The patient will be negative for positional testing indicating resolution of BPPV.   Baseline Target date 01/19/17   Time 4   Period Weeks     PT LONG TERM GOAL #3   Title The patient will improve DHI from 30% to < or equal to15% to demo dec'd self perception of dizziness.   Baseline Target date 01/19/17   Time 4   Period Weeks               Plan - 12/19/16 1503    Clinical Impression Statement The patient is a 61 yo male presenting to PT with multi-canal BPPV (initially in R posterior canal as most severe, mild in L posterior canal).  He has h/o neck surgery and had mild tightness noted during Epley's  maneuver.  Patient developed geotropic nystagmus after Epley's indicating repositioning into horizontal canal R.  Treated with canolith maneuver for R horiz canal.  PT let patient rest t /o due to c/o nausea and overall "hot,  clammy" feeling with treatment.     Rehab Potential Good   PT Frequency 1x / week  eval +   PT Duration 4 weeks   PT Treatment/Interventions Canalith Repostioning;Vestibular;Neuromuscular re-education;Patient/family education   PT Next Visit Plan Check multi-canal BPPV   Consulted and Agree with Plan of Care Patient      Patient will benefit from skilled therapeutic intervention in order to improve the following deficits and impairments:  Dizziness, Decreased balance  Visit Diagnosis: BPPV (benign paroxysmal positional vertigo), right     Problem List Patient Active Problem List   Diagnosis Date Noted  . Gout 08/07/2016  . H/O seasonal allergies 08/07/2016  . Hyperlipidemia 08/07/2016  . Chronic pain of both knees 08/07/2016  . Erectile dysfunction 08/05/2016  . Gastroesophageal reflux disease 08/05/2016    Zayden Maffei, PT 12/19/2016, 3:13 PM  [facility] [address] [place], [place], [address] Phone: [phone]   Fax:  [phone]  Name: Stanley Bennett MRN: [id number] Date of Birth: 1956-06-29

## 2016-12-19 NOTE — Patient Instructions (Signed)
Habituation - Tip Card  1.The goal of habituation training is to assist in decreasing symptoms of vertigo, dizziness, or nausea provoked by specific head and body motions. 2.These exercises may initially increase symptoms; however, be persistent and work through symptoms. With repetition and time, the exercises will assist in reducing or eliminating symptoms. 3.Exercises should be stopped and discussed with the therapist if you experience any of the following: - Sudden change or fluctuation in hearing - New onset of ringing in the ears, or increase in current intensity - Any fluid discharge from the ear - Severe pain in neck or back - Extreme nausea  Copyright  VHI. All rights reserved.   Habituation - Rolling   With pillow under head, start on back. Roll to your right side.  Hold until dizziness stops, plus 20 seconds and then roll to the left side.  Hold until dizziness stops, plus 20 seconds.  Repeat sequence 5 times per session. Do 2 sessions per day.  Copyright  VHI. All rights reserved.

## 2016-12-23 ENCOUNTER — Ambulatory Visit: Payer: BLUE CROSS/BLUE SHIELD | Admitting: Rehabilitative and Restorative Service Providers"

## 2016-12-23 DIAGNOSIS — H8111 Benign paroxysmal vertigo, right ear: Secondary | ICD-10-CM

## 2016-12-23 NOTE — Therapy (Signed)
[facility] [address] [place], [place], [address] Phone: [phone]   Fax:  [phone]  Physical Therapy Treatment  Patient Details  Name: Stanley Bennett MRN: [id number] Date of Birth: [birth year] Referring Provider: Colin Benton, DO  Encounter Date: 12/23/2016      PT End of Session - 12/23/16 1143    Visit Number 2   Number of Visits 4   Date for PT Re-Evaluation 01/18/17   Authorization Type private insurance   PT Start Time 1110   PT Stop Time 1140   PT Time Calculation (min) 30 min   Activity Tolerance Patient tolerated treatment well   Behavior During Therapy [facility] for tasks assessed/performed      Past Medical History:  Diagnosis Date  . Allergy   . Gout   . Hyperlipidemia     Past Surgical History:  Procedure Laterality Date  . APPENDECTOMY    . APPENDECTOMY    . CERVICAL DISC SURGERY    . TONSILLECTOMY      There were no vitals filed for this visit.      Subjective Assessment - 12/23/16 1112    Subjective The patient went home Monday and slept for 2 hours.  He reports symptoms are so much better and he is able to look up for the first time. He is still noting some mild dizziness with quick horizontal head motion (room doesn't spin, the screens look like they move a little bit).   Pertinent History h/o neck surgery   Patient Stated Goals Get rid of vertigo.   Currently in Pain? No/denies                Vestibular Assessment - 12/23/16 1117      Positional Testing   Dix-Hallpike Dix-Hallpike Right;Dix-Hallpike Left   Horizontal Canal Testing Horizontal Canal Right;Horizontal Canal Left     Dix-Hallpike Left   Dix-Hallpike Left Duration mild nystagmus viewed--cannot tell direction in room light     Horizontal Canal Right   Horizontal Canal Right Symptoms Ageotrophic     Horizontal Canal Left   Horizontal Canal Left Duration greater amplitude/intensity of nystagmus to  the left side indicating R horizontal cupulolithiasis.   Horizontal Canal Left Symptoms Ageotrophic                  Vestibular Treatment/Exercise - 12/23/16 0001      Vestibular Treatment/Exercise   Vestibular Treatment Provided Canalith Repositioning;Habituation   Canalith Repositioning Canal Roll Right  Kim maneuver, 2011   Habituation Exercises Horizontal Roll     Canal Roll Right   Number of Reps  2   Overall Response  Improved Symptoms  mild sensation of dizziness with small amplitude nystagmus   Response Details  Patient continued with midl apogeotropic nystagmus.  Then performed gufonia for HC cupulo (lied down R sidelying nose at neutral x 30 sec, then nose to ground x 2-3 minutes, then return to sitting).     Horizontal Roll   Number of Reps  3   Symptom Description  reviewed for HEP, as patient did not complete since evaluation on Monday.               PT Education - 12/23/16 1142    Education provided Yes   Education Details recommended continuation of horizontal rolling for habituation   Person(s) Educated Patient   Methods Explanation;Demonstration;Handout   Comprehension Returned demonstration  PT Long Term Goals - 12/19/16 1310      PT LONG TERM GOAL #1   Title The patient will be indep with HEP for self mgmt of BPPV.   Baseline Target date 01/19/17   Time 4   Period Weeks     PT LONG TERM GOAL #2   Title The patient will be negative for positional testing indicating resolution of BPPV.   Baseline Target date 01/19/17   Time 4   Period Weeks     PT LONG TERM GOAL #3   Title The patient will improve DHI from 30% to < or equal to15% to demo dec'd self perception of dizziness.   Baseline Target date 01/19/17   Time 4   Period Weeks               Plan - 12/23/16 1143    Clinical Impression Statement The patient has subjective improvement reporting no true spinning, but more a sensation of head pressure.  Positional  testing positive for apogeotropic nystagmus with horizontal rolling R<>L (worse on left side) indicating R horiozntal cupulolithiasis.  PT treated with 2 maneuvers today and provided HEP if any symptoms remain.  Plan to f/u in 2 weeks.    PT Treatment/Interventions Canalith Repostioning;Vestibular;Neuromuscular re-education;Patient/family education   PT Next Visit Plan Check multi-canal BPPV   Consulted and Agree with Plan of Care Patient      Patient will benefit from skilled therapeutic intervention in order to improve the following deficits and impairments:  Dizziness, Decreased balance  Visit Diagnosis: BPPV (benign paroxysmal positional vertigo), right     Problem List Patient Active Problem List   Diagnosis Date Noted  . Gout 08/07/2016  . H/O seasonal allergies 08/07/2016  . Hyperlipidemia 08/07/2016  . Chronic pain of both knees 08/07/2016  . Erectile dysfunction 08/05/2016  . Gastroesophageal reflux disease 08/05/2016    Romanda Turrubiates, PT 12/23/2016, 11:45 AM  [facility] [address] [place], [place], [address] Phone: [phone]   Fax:  [phone]  Name: Stanley Bennett MRN: [id number] Date of Birth: 1956-07-29

## 2016-12-30 ENCOUNTER — Encounter: Payer: Self-pay | Admitting: Family Medicine

## 2016-12-30 ENCOUNTER — Ambulatory Visit (INDEPENDENT_AMBULATORY_CARE_PROVIDER_SITE_OTHER): Payer: BLUE CROSS/BLUE SHIELD | Admitting: Family Medicine

## 2016-12-30 VITALS — BP 110/80 | HR 63 | Temp 98.2°F | Ht 69.25 in | Wt 186.0 lb

## 2016-12-30 DIAGNOSIS — J069 Acute upper respiratory infection, unspecified: Secondary | ICD-10-CM

## 2016-12-30 MED ORDER — BENZONATATE 100 MG PO CAPS: 100.0000 mg | ORAL_CAPSULE | Freq: Two times a day (BID) | ORAL | 0 refills | Status: DC | PRN

## 2016-12-30 NOTE — Patient Instructions (Signed)
INSTRUCTIONS FOR UPPER RESPIRATORY INFECTION:  -plenty of rest and fluids  -nasal saline wash 2-3 times daily (use prepackaged nasal saline or bottled/distilled water if making your own)   -can use AFRIN nasal spray for drainage and nasal congestion - but do NOT use longer then 3-4 days  -can use tylenol (in no history of liver disease) or ibuprofen (if no history of kidney disease, bowel bleeding or significant heart disease) as directed for aches and sorethroat  -in the winter time, using a humidifier at night is helpful (please follow cleaning instructions)  -if you are taking a cough medication - use only as directed, may also try a teaspoon of honey to coat the throat and throat lozenges. I sent the Tessalon to the pharmacy.  -for sore throat, salt water gargles can help  -follow up if you have fevers, facial pain, tooth pain, difficulty breathing or are worsening or symptoms persist longer then expected  Upper Respiratory Infection, Adult An upper respiratory infection (URI) is also known as the common cold. It is often caused by a type of germ (virus). Colds are easily spread (contagious). You can pass it to others by kissing, coughing, sneezing, or drinking out of the same glass. Usually, you get better in 1 to 3  weeks.  However, the cough can last for even longer. HOME CARE   Only take medicine as told by your doctor. Follow instructions provided above.  Drink enough water and fluids to keep your pee (urine) clear or pale yellow.  Get plenty of rest.  Return to work when your temperature is < 100 for 24 hours or as told by your doctor. You may use a face mask and wash your hands to stop your cold from spreading. GET HELP RIGHT AWAY IF:   After the first few days, you feel you are getting worse.  You have questions about your medicine.  You have chills, shortness of breath, or red spit (mucus).  You have pain in the face for more then 1-2 days, especially when you bend  forward.  You have a fever, puffy (swollen) neck, pain when you swallow, or white spots in the back of your throat.  You have a bad headache, ear pain, sinus pain, or chest pain.  You have a high-pitched whistling sound when you breathe in and out (wheezing).  You cough up blood.  You have sore muscles or a stiff neck. MAKE SURE YOU:   Understand these instructions.  Will watch your condition.  Will get help right away if you are not doing well or get worse. Document Released: 01/18/2008 Document Revised: 10/24/2011 Document Reviewed: 11/06/2013 [facility] Patient Information 2015 [place], [place]. This information is not intended to replace advice given to you by your health care provider. Make sure you discuss any questions you have with your health care provider.

## 2016-12-30 NOTE — Progress Notes (Signed)
HPI:  URI -started: 3-4 days ago -symptoms:nasal congestion, sore throat, cough, HA, PND -denies:fever, SOB, NVD, tooth pain, rash, body aches -has tried: flonase -sick contacts/travel/risks: no reported flu, strep or tick exposure -Hx of: allergies  ROS: See pertinent positives and negatives per HPI.  Past Medical History:  Diagnosis Date  . Allergy   . Gout   . Hyperlipidemia     Past Surgical History:  Procedure Laterality Date  . APPENDECTOMY    . APPENDECTOMY    . CERVICAL DISC SURGERY    . TONSILLECTOMY      Family History  Problem Relation Age of Onset  . Arthritis Mother   . Arthritis Father   . Heart disease Father   . Cancer Sister        ovarian  . Diabetes Sister   . Kidney disease Cousin     Social History   Social History  . Marital status: Married    Spouse name: N/A  . Number of children: N/A  . Years of education: N/A   Social History Main Topics  . Smoking status: Never Smoker  . Smokeless tobacco: Never Used  . Alcohol use None     Comment: 2 - 5 days per week  . Drug use: No  . Sexual activity: Yes    Partners: Female   Other Topics Concern  . None   Social History Narrative   Work or School: [employer], 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      Home Situation: lives with wife      Spiritual Beliefs: Catholic      Lifestyle: regular exercise and healthy diet        Current Outpatient Prescriptions:  .  allopurinol (ZYLOPRIM) 300 MG tablet, Take 1 tablet (300 mg total) by mouth daily. as directed, Disp: 90 tablet, Rfl: 3 .  Cetirizine HCl (KLS ALLER-TEC PO), Take by mouth., Disp: , Rfl:  .  fluticasone (FLONASE) 50 MCG/ACT nasal spray, , Disp: , Rfl:  .  meclizine (ANTIVERT) 25 MG tablet, Take 1 tablet (25 mg total) by mouth 3 (three) times daily as needed for dizziness., Disp: 30 tablet, Rfl: 0 .  naproxen (NAPROSYN) 500 MG tablet, Take 1 tablet (500 mg total) by mouth 2 (two) times daily with a meal., Disp: 14 tablet,  Rfl: 0 .  pantoprazole (PROTONIX) 40 MG tablet, Take 1 tablet (40 mg total) by mouth daily., Disp: 90 tablet, Rfl: 3 .  sildenafil (REVATIO) 20 MG tablet, Take 1-4 tablets as needed for sexual activity., Disp: 50 tablet, Rfl: 0 .  simvastatin (ZOCOR) 40 MG tablet, Take 1 tablet (40 mg total) by mouth every evening., Disp: 90 tablet, Rfl: 3 .  tadalafil (CIALIS) 20 MG tablet, Take 1 tablet (20 mg total) by mouth daily as needed for erectile dysfunction., Disp: 10 tablet, Rfl: 3 .  benzonatate (TESSALON) 100 MG capsule, Take 1 capsule (100 mg total) by mouth 2 (two) times daily as needed for cough., Disp: 20 capsule, Rfl: 0  EXAM:  Vitals:   12/30/16 1301  BP: 110/80  Pulse: 63  Temp: 98.2 F (36.8 C)    Body mass index is 27.27 kg/m.  GENERAL: vitals reviewed and listed above, alert, oriented, appears well hydrated and in no acute distress  HEENT: atraumatic, conjunttiva clear, no obvious abnormalities on inspection of external nose and ears, normal appearance of ear canals and TMs, clear nasal congestion, mild post oropharyngeal erythema with PND, no tonsillar edema or exudate, no  sinus TTP  NECK: no obvious masses on inspection  LUNGS: clear to auscultation bilaterally, no wheezes, rales or rhonchi, good air movement  CV: HRRR, no peripheral edema  MS: moves all extremities without noticeable abnormality  PSYCH: pleasant and cooperative, no obvious depression or anxiety  ASSESSMENT AND PLAN:  Discussed the following assessment and plan:  Viral upper respiratory tract infection  -given HPI and exam findings today, a serious infection or illness is unlikely. We discussed potential etiologies, with VURI being most likely, and advised Tessalon for cough, allergy regimen and supportive care and monitoring. We discussed treatment side effects, likely course, antibiotic misuse, transmission, and signs of developing a serious illness. -of course, we advised to return or notify a  doctor immediately if symptoms worsen or persist or new concerns arise.    Patient Instructions  INSTRUCTIONS FOR UPPER RESPIRATORY INFECTION:  -plenty of rest and fluids  -nasal saline wash 2-3 times daily (use prepackaged nasal saline or bottled/distilled water if making your own)   -can use AFRIN nasal spray for drainage and nasal congestion - but do NOT use longer then 3-4 days  -can use tylenol (in no history of liver disease) or ibuprofen (if no history of kidney disease, bowel bleeding or significant heart disease) as directed for aches and sorethroat  -in the winter time, using a humidifier at night is helpful (please follow cleaning instructions)  -if you are taking a cough medication - use only as directed, may also try a teaspoon of honey to coat the throat and throat lozenges. I sent the Tessalon to the pharmacy.  -for sore throat, salt water gargles can help  -follow up if you have fevers, facial pain, tooth pain, difficulty breathing or are worsening or symptoms persist longer then expected  Upper Respiratory Infection, Adult An upper respiratory infection (URI) is also known as the common cold. It is often caused by a type of germ (virus). Colds are easily spread (contagious). You can pass it to others by kissing, coughing, sneezing, or drinking out of the same glass. Usually, you get better in 1 to 3  weeks.  However, the cough can last for even longer. HOME CARE   Only take medicine as told by your doctor. Follow instructions provided above.  Drink enough water and fluids to keep your pee (urine) clear or pale yellow.  Get plenty of rest.  Return to work when your temperature is < 100 for 24 hours or as told by your doctor. You may use a face mask and wash your hands to stop your cold from spreading. GET HELP RIGHT AWAY IF:   After the first few days, you feel you are getting worse.  You have questions about your medicine.  You have chills, shortness of  breath, or red spit (mucus).  You have pain in the face for more then 1-2 days, especially when you bend forward.  You have a fever, puffy (swollen) neck, pain when you swallow, or white spots in the back of your throat.  You have a bad headache, ear pain, sinus pain, or chest pain.  You have a high-pitched whistling sound when you breathe in and out (wheezing).  You cough up blood.  You have sore muscles or a stiff neck. MAKE SURE YOU:   Understand these instructions.  Will watch your condition.  Will get help right away if you are not doing well or get worse. Document Released: 01/18/2008 Document Revised: 10/24/2011 Document Reviewed: 11/06/2013 ExitCare Patient Information 2015  [employer]. This information is not intended to replace advice given to you by your health care provider. Make sure you discuss any questions you have with your health care provider.    Colin Benton R., DO

## 2017-01-06 ENCOUNTER — Ambulatory Visit: Payer: BLUE CROSS/BLUE SHIELD | Admitting: Rehabilitative and Restorative Service Providers"

## 2017-01-06 DIAGNOSIS — H8111 Benign paroxysmal vertigo, right ear: Secondary | ICD-10-CM | POA: Diagnosis not present

## 2017-01-06 NOTE — Patient Instructions (Signed)
Habituation - Tip Card  1.The goal of habituation training is to assist in decreasing symptoms of vertigo, dizziness, or nausea provoked by specific head and body motions. 2.These exercises may initially increase symptoms; however, be persistent and work through symptoms. With repetition and time, the exercises will assist in reducing or eliminating symptoms. 3.Exercises should be stopped and discussed with the therapist if you experience any of the following: - Sudden change or fluctuation in hearing - New onset of ringing in the ears, or increase in current intensity - Any fluid discharge from the ear - Severe pain in neck or back - Extreme nausea  Copyright  VHI. All rights reserved.   Habituation - Sit to Side-Lying   Sit on edge of bed. Lie down onto the right side and hold until dizziness stops, plus 20 seconds.  Return to sitting and wait until dizziness stops, plus 20 seconds.  Repeat to the left side. Repeat sequence 5 times per session. Do 2 sessions per day.  Copyright  VHI. All rights reserved.    Gaze Stabilization - Tip Card  1.Target must remain in focus, not blurry, and appear stationary while head is in motion. 2.Perform exercises with small head movements (45 to either side of midline). 3.Increase speed of head motion so long as target is in focus. 4.If you wear eyeglasses, be sure you can see target through lens (therapist will give specific instructions for bifocal / progressive lenses). 5.These exercises may provoke dizziness or nausea. Work through these symptoms. If too dizzy, slow head movement slightly. Rest between each exercise. 6.Exercises demand concentration; avoid distractions. 7.For safety, perform standing exercises close to a counter, wall, corner, or next to someone.  Copyright  VHI. All rights reserved.   Gaze Stabilization - Standing Feet Apart   Feet shoulder width apart, keeping eyes on target on wall 3 feet away, tilt head down slightly and  move head side to side for 30 seconds. *Increase to 60 seconds as tolerated. Do 2-3 sessions per day.  *Remember to move your head not your shoulders. Copyright  VHI. All rights reserved.

## 2017-01-06 NOTE — Therapy (Signed)
[facility] [address] [place], [place], [address] Phone: [phone]   Fax:  [phone]  Physical Therapy Treatment  Patient Details  Name: Stanley Bennett MRN: [id number] Date of Birth: Jan 06, 1956 Referring Provider: Colin Benton, DO  Encounter Date: 01/06/2017      PT End of Session - 01/06/17 1248    Visit Number 3   Number of Visits 4   Date for PT Re-Evaluation 01/18/17   Authorization Type private insurance   PT Start Time 1229   PT Stop Time 1248   PT Time Calculation (min) 19 min   Activity Tolerance Patient tolerated treatment well   Behavior During Therapy [facility] for tasks assessed/performed      Past Medical History:  Diagnosis Date  . Allergy   . Gout   . Hyperlipidemia     Past Surgical History:  Procedure Laterality Date  . APPENDECTOMY    . APPENDECTOMY    . CERVICAL DISC SURGERY    . TONSILLECTOMY      There were no vitals filed for this visit.      Subjective Assessment - 01/06/17 1229    Subjective The patient notes that he is able to bend forward, look up and do things that he couldn't do before.  He still notes "minor" sensation of sporadic dizziness that lasts for a couple of seconds with a quick/unexpected turn.   He occasionally gets a sensation when lying supine in the back of his head described as pressure.     Pertinent History h/o neck surgery   Patient Stated Goals Get rid of vertigo.   Currently in Pain? No/denies                Vestibular Assessment - 01/06/17 1235      Positional Testing   Dix-Hallpike Dix-Hallpike Right;Dix-Hallpike Left   Horizontal Canal Testing Horizontal Canal Right;Horizontal Canal Left     Dix-Hallpike Right   Dix-Hallpike Right Duration Trace of symptom reported that is described as "brief" in duration and "mild" in intensity.    Dix-Hallpike Right Symptoms No nystagmus     Dix-Hallpike Left   Dix-Hallpike Left Duration none    Dix-Hallpike Left Symptoms No nystagmus     Horizontal Canal Right   Horizontal Canal Right Duration none   Horizontal Canal Right Symptoms Ageotrophic  trace- <5 beats     Horizontal Canal Left   Horizontal Canal Left Duration none   Horizontal Canal Left Symptoms Normal                  Vestibular Treatment/Exercise - 01/06/17 1238      Vestibular Treatment/Exercise   Vestibular Treatment Provided Gaze;Habituation   Habituation Exercises Laruth Bouchard Daroff;Horizontal Roll   Gaze Exercises X1 Viewing Horizontal     Nestor Lewandowsky   Number of Reps  2   Symptom Description  Trace of head pressure, no nystagmus noted.     X1 Viewing Horizontal   Foot Position standing feet apart   Comments 30 seconds with cues for visual fixation and slowing speed with visual blurring               PT Education - 01/06/17 1248    Education provided Yes   Education Details HEP: habituation sit<>sidelying; gaze x 1 viewing   Person(s) Educated Patient   Methods Explanation;Demonstration;Handout   Comprehension Verbalized understanding;Returned demonstration             PT Long Term Goals -  01/06/17 1248      PT LONG TERM GOAL #1   Title The patient will be indep with HEP for self mgmt of BPPV.   Baseline Target date 01/19/17   Time 4   Period Weeks   Status Achieved     PT LONG TERM GOAL #2   Title The patient will be negative for positional testing indicating resolution of BPPV.   Baseline Target date 01/19/17   Time 4   Period Weeks   Status Achieved     PT LONG TERM GOAL #3   Title The patient will improve DHI from 30% to < or equal to15% to demo dec'd self perception of dizziness.   Baseline Target date 01/19/17  *sent FOTO survey to e-mail on5/25/18   Time 4   Period Weeks               Plan - 01/06/17 1251    Clinical Impression Statement The patient met 2 LTGs.   There are no true nystagmus viewed with positional testing, but a "trace" of  sensation.  Patient also noted some sensation of intermittent symptoms with quick turns.  PT provided gaze x 1 viewing to address.    PT Treatment/Interventions Canalith Repostioning;Vestibular;Neuromuscular re-education;Patient/family education   PT Next Visit Plan discharge if no phone f/u call *plan to send d/c in 2 weeks-- patient to call if symptoms return or if ?s about HEP.   Consulted and Agree with Plan of Care Patient      Patient will benefit from skilled therapeutic intervention in order to improve the following deficits and impairments:  Dizziness, Decreased balance  Visit Diagnosis: BPPV (benign paroxysmal positional vertigo), right     Problem List Patient Active Problem List   Diagnosis Date Noted  . Gout 08/07/2016  . H/O seasonal allergies 08/07/2016  . Hyperlipidemia 08/07/2016  . Chronic pain of both knees 08/07/2016  . Erectile dysfunction 08/05/2016  . Gastroesophageal reflux disease 08/05/2016    Daryan Cagley, PT 01/06/2017, 12:53 PM  [facility] [address] [place], [place], [address] Phone: [phone]   Fax:  [phone]  Name: ASAEL PANN MRN: [id number] Date of Birth: 09-29-1955

## 2017-01-24 ENCOUNTER — Encounter: Payer: Self-pay | Admitting: Rehabilitative and Restorative Service Providers"

## 2017-01-24 NOTE — Therapy (Signed)
[facility] [address] [place], [place], [address] Phone: [phone]   Fax:  [phone]  Patient Details  Name: Stanley Bennett MRN: [id number] Date of Birth: [birth year] Referring Provider:  Colin Benton, DO  Encounter Date: last encounter 01/06/17  PHYSICAL THERAPY DISCHARGE SUMMARY  Visits from Start of Care: 3  Current functional level related to goals / functional outcomes:       PT Long Term Goals - 01/06/17 1248      PT LONG TERM GOAL #1   Title The patient will be indep with HEP for self mgmt of BPPV.   Baseline Target date 01/19/17   Time 4   Period Weeks   Status Achieved     PT LONG TERM GOAL #2   Title The patient will be negative for positional testing indicating resolution of BPPV.   Baseline Target date 01/19/17   Time 4   Period Weeks   Status Achieved     PT LONG TERM GOAL #3   Title The patient will improve DHI from 30% to < or equal to15% to demo dec'd self perception of dizziness.   Baseline Target date 01/19/17  *sent FOTO survey to e-mail on5/25/18   Time 4   Period Weeks        Remaining deficits: Patient called to note dizziness resolved, therefore did not need another session.   Education / Equipment: HEP, habituation, self care for recurrent BPPV mgmt.  Plan: Patient agrees to discharge.  Patient goals were not met. Patient is being discharged due to meeting the stated rehab goals.  ?????        Thank you for the referral of this patient. Rudell Cobb, MPT      Stanley Bennett 01/24/2017, 10:07 AM  [facility] [address] [place], [place], [address] Phone: [phone]   Fax:  [phone]

## 2017-02-21 ENCOUNTER — Encounter: Payer: Self-pay | Admitting: Family Medicine

## 2017-03-06 ENCOUNTER — Ambulatory Visit (INDEPENDENT_AMBULATORY_CARE_PROVIDER_SITE_OTHER): Payer: BLUE CROSS/BLUE SHIELD | Admitting: Family Medicine

## 2017-03-06 ENCOUNTER — Encounter: Payer: Self-pay | Admitting: Family Medicine

## 2017-03-06 VITALS — BP 100/58 | HR 59 | Temp 97.8°F | Ht 69.25 in | Wt 181.5 lb

## 2017-03-06 DIAGNOSIS — R197 Diarrhea, unspecified: Secondary | ICD-10-CM

## 2017-03-06 DIAGNOSIS — M545 Low back pain: Secondary | ICD-10-CM | POA: Diagnosis not present

## 2017-03-06 DIAGNOSIS — M25561 Pain in right knee: Secondary | ICD-10-CM

## 2017-03-06 DIAGNOSIS — R194 Change in bowel habit: Secondary | ICD-10-CM

## 2017-03-06 DIAGNOSIS — R634 Abnormal weight loss: Secondary | ICD-10-CM

## 2017-03-06 LAB — CBC WITH DIFFERENTIAL/PLATELET
Basophils Absolute: 0 10*3/uL (ref 0.0–0.1)
Basophils Relative: 0.7 % (ref 0.0–3.0)
Eosinophils Absolute: 0.1 10*3/uL (ref 0.0–0.7)
Eosinophils Relative: 1.6 % (ref 0.0–5.0)
HCT: 40.1 % (ref 39.0–52.0)
Hemoglobin: 13 g/dL (ref 13.0–17.0)
Lymphocytes Relative: 19.2 % (ref 12.0–46.0)
Lymphs Abs: 1.1 10*3/uL (ref 0.7–4.0)
MCHC: 32.4 g/dL (ref 30.0–36.0)
MCV: 85.6 fl (ref 78.0–100.0)
Monocytes Absolute: 0.6 10*3/uL (ref 0.1–1.0)
Monocytes Relative: 9.6 % (ref 3.0–12.0)
Neutro Abs: 4 10*3/uL (ref 1.4–7.7)
Neutrophils Relative %: 68.9 % (ref 43.0–77.0)
Platelets: 291 10*3/uL (ref 150.0–400.0)
RBC: 4.68 Mil/uL (ref 4.22–5.81)
RDW: 17.4 % — ABNORMAL HIGH (ref 11.5–15.5)
WBC: 5.8 10*3/uL (ref 4.0–10.5)

## 2017-03-06 LAB — COMPREHENSIVE METABOLIC PANEL
ALT: 10 U/L (ref 0–53)
AST: 14 U/L (ref 0–37)
Albumin: 3.6 g/dL (ref 3.5–5.2)
Alkaline Phosphatase: 56 U/L (ref 39–117)
BUN: 9 mg/dL (ref 6–23)
CO2: 28 mEq/L (ref 19–32)
Calcium: 8.6 mg/dL (ref 8.4–10.5)
Chloride: 105 mEq/L (ref 96–112)
Creatinine, Ser: 0.84 mg/dL (ref 0.40–1.50)
GFR: 98.16 mL/min (ref >60.00)
Glucose, Bld: 105 mg/dL — ABNORMAL HIGH (ref 70–99)
Potassium: 3.8 mEq/L (ref 3.5–5.1)
Sodium: 140 mEq/L (ref 135–145)
Total Bilirubin: 0.6 mg/dL (ref 0.2–1.2)
Total Protein: 6.2 g/dL (ref 6.0–8.3)

## 2017-03-06 LAB — TSH: TSH: 3.75 u[IU]/mL (ref 0.35–4.50)

## 2017-03-06 NOTE — Patient Instructions (Signed)
BEFORE YOU LEAVE: -labs -follow up: 3 months  -We placed a referral for you as discussed to the gastroenterologist. It usually takes about 1-2 weeks to process and schedule this referral. If you have not heard from [place] regarding this appointment in 2 weeks please contact our office.  -Please complete colon cancer screening with the gastroenterologist or with cologuard in interim.  -see the orthopedic doctor as planned about the knee.  We have ordered labs or studies at this visit. It can take up to 1-2 weeks for results and processing. IF results require follow up or explanation, we will call you with instructions. Clinically stable results will be released to your [facility]. If you have not heard from [place] or cannot find your results in [facility] in 2 weeks please contact our office at [phone].  If you are not yet signed up for [facility], please consider signing up.

## 2017-03-06 NOTE — Progress Notes (Signed)
HPI:  Acute visit for diarrhea: -started acutely at beach last week, had frequent watery diarrhea, cramping abd pain and back pain for 3 days - now resolved -however, reports chronic bowel issues worse this year -has 3-4 BMs daily on average, some loose - usually without discomfort -has had several episodes of very watery diarrhea this year -he was going to do the cologuard test for colon cancer screening but his wife wants him to see GI -having another flare of R knee pain and swelling since the beach and plans to see ortho today -no fevers, vomiting, melena, hematochezia -on ROC he has lost 9 lbs since 07/2016  ROS: See pertinent positives and negatives per HPI.  Past Medical History:  Diagnosis Date  . Allergy   . Gout   . Hyperlipidemia     Past Surgical History:  Procedure Laterality Date  . APPENDECTOMY    . APPENDECTOMY    . CERVICAL DISC SURGERY    . TONSILLECTOMY      Family History  Problem Relation Age of Onset  . Arthritis Mother   . Arthritis Father   . Heart disease Father   . Cancer Sister        ovarian  . Diabetes Sister   . Kidney disease Cousin     Social History   Social History  . Marital status: Married    Spouse name: N/A  . Number of children: N/A  . Years of education: N/A   Social History Main Topics  . Smoking status: Never Smoker  . Smokeless tobacco: Never Used  . Alcohol use None     Comment: 2 - 5 days per week  . Drug use: No  . Sexual activity: Yes    Partners: Female   Other Topics Concern  . None   Social History Narrative   Work or School: [employer], 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      Home Situation: lives with wife      Spiritual Beliefs: Catholic      Lifestyle: regular exercise and healthy diet        Current Outpatient Prescriptions:  .  allopurinol (ZYLOPRIM) 300 MG tablet, Take 1 tablet (300 mg total) by mouth daily. as directed, Disp: 90 tablet, Rfl: 3 .  Cetirizine HCl (KLS ALLER-TEC PO),  Take by mouth., Disp: , Rfl:  .  fluticasone (FLONASE) 50 MCG/ACT nasal spray, , Disp: , Rfl:  .  meclizine (ANTIVERT) 25 MG tablet, Take 1 tablet (25 mg total) by mouth 3 (three) times daily as needed for dizziness., Disp: 30 tablet, Rfl: 0 .  naproxen (NAPROSYN) 500 MG tablet, Take 1 tablet (500 mg total) by mouth 2 (two) times daily with a meal., Disp: 14 tablet, Rfl: 0 .  pantoprazole (PROTONIX) 40 MG tablet, Take 1 tablet (40 mg total) by mouth daily., Disp: 90 tablet, Rfl: 3 .  sildenafil (REVATIO) 20 MG tablet, Take 1-4 tablets as needed for sexual activity., Disp: 50 tablet, Rfl: 0 .  simvastatin (ZOCOR) 40 MG tablet, Take 1 tablet (40 mg total) by mouth every evening., Disp: 90 tablet, Rfl: 3 .  tadalafil (CIALIS) 20 MG tablet, Take 1 tablet (20 mg total) by mouth daily as needed for erectile dysfunction., Disp: 10 tablet, Rfl: 3  EXAM:  Vitals:   03/06/17 1353  BP: (!) 100/58  Pulse: (!) 59  Temp: 97.8 F (36.6 C)    Body mass index is 26.61 kg/m.  GENERAL: vitals reviewed and listed above, alert,  oriented, appears well hydrated and in no acute distress  HEENT: atraumatic, conjunttiva clear, no obvious abnormalities on inspection of external nose and ears  NECK: no obvious masses on inspection  LUNGS: clear to auscultation bilaterally, no wheezes, rales or rhonchi, good air movement  CV: HRRR, no peripheral edema  ABD: BS+, soft, NTTP, no rebound or guarding  MS: moves all extremities without noticeable abnormality  PSYCH: pleasant and cooperative, no obvious depression or anxiety  ASSESSMENT AND PLAN:  Discussed the following assessment and plan:  Diarrhea, unspecified type - Plan: CBC with Differential/Platelet, Celiac Disease Comprehensive Panel with Reflexes, Comprehensive metabolic panel, TSH, Ambulatory referral to Gastroenterology  Change in bowel habits - Plan: CBC with Differential/Platelet, Celiac Disease Comprehensive Panel with Reflexes, Comprehensive  metabolic panel, TSH, Ambulatory referral to Gastroenterology  Weight loss - Plan: Ambulatory referral to Gastroenterology  Recurrent pain of right knee  -we discussed possible serious and likely etiologies, workup and treatment, treatment risks and return precautions -after this discussion, Zakai opted for: -recent event sound infectious, but given resolution symptoms stool studies unlikely to be beneficial -will gets some labs per orders for chronic disease and referral to GI placed per his request and since has had unexplained wt loss as well on review of chart -he plans to see ortho today for the knee -Patient advised to return or notify a doctor immediately if symptoms worsen or persist or new concerns arise.  Patient Instructions  BEFORE YOU LEAVE: -labs -follow up: 3 months  -We placed a referral for you as discussed to the gastroenterologist. It usually takes about 1-2 weeks to process and schedule this referral. If you have not heard from [place] regarding this appointment in 2 weeks please contact our office.  -Please complete colon cancer screening with the gastroenterologist or with cologuard in interim.  -see the orthopedic doctor as planned about the knee.  We have ordered labs or studies at this visit. It can take up to 1-2 weeks for results and processing. IF results require follow up or explanation, we will call you with instructions. Clinically stable results will be released to your [facility]. If you have not heard from [place] or cannot find your results in [facility] in 2 weeks please contact our office at [phone].  If you are not yet signed up for [facility], please consider signing up.            Colin Benton R., DO

## 2017-03-07 LAB — CELIAC DISEASE COMPREHENSIVE PANEL WITH REFLEXES
IgA: 241 mg/dL (ref 81–463)
Tissue Transglutaminase Ab, IgA: 1 U/mL (ref <4)

## 2017-03-10 ENCOUNTER — Encounter: Payer: Self-pay | Admitting: Physician Assistant

## 2017-03-10 ENCOUNTER — Ambulatory Visit (INDEPENDENT_AMBULATORY_CARE_PROVIDER_SITE_OTHER): Payer: BLUE CROSS/BLUE SHIELD | Admitting: Physician Assistant

## 2017-03-10 VITALS — BP 120/70 | HR 80 | Ht 69.5 in | Wt 179.0 lb

## 2017-03-10 DIAGNOSIS — R194 Change in bowel habit: Secondary | ICD-10-CM

## 2017-03-10 DIAGNOSIS — K219 Gastro-esophageal reflux disease without esophagitis: Secondary | ICD-10-CM

## 2017-03-10 DIAGNOSIS — Z1211 Encounter for screening for malignant neoplasm of colon: Secondary | ICD-10-CM

## 2017-03-10 MED ORDER — NA SULFATE-K SULFATE-MG SULF 17.5-3.13-1.6 GM/177ML PO SOLN: ORAL | 0 refills | Status: DC

## 2017-03-10 NOTE — Patient Instructions (Addendum)
Continue Protonix 40 mg, take 1 tab by mouth every morning.  You have been scheduled for a colonoscopy and endoscopy. Please follow written instructions given to you at your visit today.  Please pick up your prep supplies at the pharmacy within the next 1-3 days. [facility] in [place]. If you use inhalers (even only as needed), please bring them with you on the day of your procedure. Your physician has requested that you go to www.startemmi.com and enter the access code given to you at your visit today. This web site gives a general overview about your procedure. However, you should still follow specific instructions given to you by our office regarding your preparation for the procedure.

## 2017-03-10 NOTE — Progress Notes (Signed)
Assessment and plans reviewed  

## 2017-03-10 NOTE — Progress Notes (Addendum)
Subjective:    Patient ID: Stanley Bennett, male    DOB: [birth year], 61 y.o.   MRN: [id number]  HPI Stanley Bennett is a very nice 61 year old white male, new to GI today referred by Dr. Colin Bennett for evaluation of recent diarrhea. Patient has history of GERD, osteoarthritis, gout and BPPV He relates that he did have very remote colonoscopy done 11 or 12 years ago he believes by Dr. Benson Bennett which was negative. Patient states over the past year or so he has had occasional bouts of diarrhea which she says occur once or twice per month. During these episodes she will have several loose bowel movements in 1 day and then goes back to his usual normal bowel movements. His appetite had been fine his weight had been stable until he had the flu in February and lost 5 pounds. He had an acute diarrheal illness a couple of weeks ago and lost another 5 pounds. He had not had any associated abdominal pain or cramping with these bowel changes over the past year. He also had an acute diarrheal illness about a week ago that lasted for 5 days. This was associated with 8-10 diarrheal bowel movements per day and significant abdominal cramping and fatigue. He did not have any hematochezia and no fever or chills no nausea or vomiting. He says he is completely over that episode at this point and back to normal bowel movements. He does travel frequently overseas and was in [place] in June. No recent antibiotics, no changes in meds supplements etc. no known infectious exposures. Family history is negative for colon cancer and IBD he does have a brother with celiac disease. Patient was seen by primary care on 03/06/2017 had labs done with normal CBC and chemistries, TSH of 3.75 and TTG and IgA were within normal limits. He also mentions that he has been treated for chronic GERD for at least the past 15 years and has been on PPI therapy. He is currently taking Protonix which is controlling his symptoms well. He has no complaints of  dysphagia or odynophagia. He has not had prior EGD.  Review of Systems Pertinent positive and negative review of systems were noted in the above HPI section.  All other review of systems was otherwise negative.  Outpatient Encounter Prescriptions as of 03/10/2017  Medication Sig  . allopurinol (ZYLOPRIM) 300 MG tablet Take 1 tablet (300 mg total) by mouth daily. as directed  . Cetirizine HCl (KLS ALLER-TEC PO) Take by mouth.  . fluticasone (FLONASE) 50 MCG/ACT nasal spray Place 1 spray into both nostrils 2 (two) times daily.   . meclizine (ANTIVERT) 25 MG tablet Take 1 tablet (25 mg total) by mouth 3 (three) times daily as needed for dizziness.  . naproxen (NAPROSYN) 500 MG tablet Take 1 tablet (500 mg total) by mouth 2 (two) times daily with a meal.  . pantoprazole (PROTONIX) 40 MG tablet Take 40 mg by mouth daily before breakfast.  . PRESCRIPTION MEDICATION Allergy shots every week  . sildenafil (REVATIO) 20 MG tablet Take 1-4 tablets as needed for sexual activity.  . simvastatin (ZOCOR) 40 MG tablet Take 1 tablet (40 mg total) by mouth every evening.  . tadalafil (CIALIS) 20 MG tablet Take 1 tablet (20 mg total) by mouth daily as needed for erectile dysfunction.  . [DISCONTINUED] pantoprazole (PROTONIX) 40 MG tablet Take 40 mg by mouth at bedtime.  . Na Sulfate-K Sulfate-Mg Sulf 17.5-3.13-1.6 GM/180ML SOLN Take as directed for colonoscopy.  . [DISCONTINUED] Na  Sulfate-K Sulfate-Mg Sulf 17.5-3.13-1.6 GM/180ML SOLN Take as directed for colonoscopy.  . [DISCONTINUED] pantoprazole (PROTONIX) 40 MG tablet Take 1 tablet (40 mg total) by mouth daily. (Patient taking differently: Take 40 mg by mouth at bedtime. )   No facility-administered encounter medications on file as of 03/10/2017.    Allergies  Allergen Reactions  . Tamiflu [Oseltamivir] Rash    Mild rash 2018   Patient Active Problem List   Diagnosis Date Noted  . Gout 08/07/2016  . H/O seasonal allergies 08/07/2016  . Hyperlipidemia  08/07/2016  . Chronic pain of both knees 08/07/2016  . Erectile dysfunction 08/05/2016  . Gastroesophageal reflux disease 08/05/2016   Social History   Social History  . Marital status: Married    Spouse name: Stanley Bennett  . Number of children: 3  . 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 Smoking status: Never Smoker  . Smokeless tobacco: Never Used  . Alcohol use 6.0 oz/week    10 Cans of beer per week     Comment: 2 - 5 days per week  . Drug use: No  . Sexual activity: Yes    Partners: Female   Other Topics Concern  . Not on file   Social History Narrative   Work or School: [employer], 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      Home Situation: lives with wife      Spiritual Beliefs: Catholic      Lifestyle: regular exercise and healthy diet       [name]'s family history includes Arthritis in his father and mother; Cancer in his sister; Diabetes in his sister; Heart disease in his father; Kidney failure in his cousin and cousin; Other in his brother.      Objective:    Vitals:   03/10/17 0832  BP: 120/70  Pulse: 80    Physical Exam well-developed white male in no acute distress, pleasant 120/70 pulse 80, Height 5 foot 9, weight 179, BMI 26.0. HEENT ;nontraumatic normocephalic EOMI PERRLA sclera anicteric, Cardiovascular ;regular rate and rhythm with S1-S2 no murmur or gallop, Pulmonary ;clear bilaterally, Abdomen; soft, nontender nondistended bowel sounds are active there is no palpable mass or hepatosplenomegaly, Rectal; exam not done, Extremities; no clubbing cyanosis or edema skin warm and dry, Neuropsych ;mood and affect appropriate       Assessment & Plan:   #25 61 year old white male with change in bowel habits over the past year with occasional bouts of diarrhea. Etiology not clear, no associated pain cramping or bleeding.  Patient had what sounds like an acute gastroenteritis about  a week ago with significant abdominal cramping and diarrhea and fatigue which completely resolved after 5 days. #2 Colon cancer surveillance-patient had 1 previous colonoscopy 11 or 12 years ago which he believes was negative #3 GERD-chronic requiring PPI therapy over the past 15 years-rule out Barrett's esophagus  Plan; Patient will be scheduled for colonoscopy and EGD with Dr. Henrene Pastor. Both procedures discussed in detail with patient including risks and benefits and he is agreeable to proceed. Continue Protonix 40 mg by mouth daily at bedtime. We will request copy of his previous colonoscopy done per Dr. Benson Bennett.  Addendum; prior colonoscopy October 2006 Dr. Benson Bennett negative except internal next renal hemorrhoids, EGD October 2006 GERD, hiatal hernia and non-obstructive Schatzki's ring.  Brittannie Tawney S Emmett Arntz PA-C 03/10/2017   Cc: Lucretia Kern, DO

## 2017-03-14 DIAGNOSIS — H00025 Hordeolum internum left lower eyelid: Secondary | ICD-10-CM | POA: Diagnosis not present

## 2017-04-13 DIAGNOSIS — J301 Allergic rhinitis due to pollen: Secondary | ICD-10-CM | POA: Diagnosis not present

## 2017-04-13 DIAGNOSIS — J3081 Allergic rhinitis due to animal (cat) (dog) hair and dander: Secondary | ICD-10-CM | POA: Diagnosis not present

## 2017-04-14 DIAGNOSIS — J3089 Other allergic rhinitis: Secondary | ICD-10-CM | POA: Diagnosis not present

## 2017-05-04 ENCOUNTER — Encounter: Payer: Self-pay | Admitting: Family Medicine

## 2017-05-10 ENCOUNTER — Encounter: Payer: Self-pay | Admitting: Internal Medicine

## 2017-05-15 ENCOUNTER — Ambulatory Visit (AMBULATORY_SURGERY_CENTER): Payer: BLUE CROSS/BLUE SHIELD | Admitting: Internal Medicine

## 2017-05-15 ENCOUNTER — Encounter: Payer: Self-pay | Admitting: Internal Medicine

## 2017-05-15 VITALS — BP 128/73 | HR 46 | Temp 97.6°F | Resp 13 | Ht 69.5 in | Wt 179.0 lb

## 2017-05-15 DIAGNOSIS — D124 Benign neoplasm of descending colon: Secondary | ICD-10-CM

## 2017-05-15 DIAGNOSIS — K219 Gastro-esophageal reflux disease without esophagitis: Secondary | ICD-10-CM | POA: Diagnosis not present

## 2017-05-15 DIAGNOSIS — R197 Diarrhea, unspecified: Secondary | ICD-10-CM

## 2017-05-15 DIAGNOSIS — Z1211 Encounter for screening for malignant neoplasm of colon: Secondary | ICD-10-CM

## 2017-05-15 DIAGNOSIS — D123 Benign neoplasm of transverse colon: Secondary | ICD-10-CM

## 2017-05-15 DIAGNOSIS — D122 Benign neoplasm of ascending colon: Secondary | ICD-10-CM

## 2017-05-15 HISTORY — PX: COLONOSCOPY: SHX174

## 2017-05-15 HISTORY — PX: UPPER GASTROINTESTINAL ENDOSCOPY: SHX188

## 2017-05-15 MED ORDER — SODIUM CHLORIDE 0.9 % IV SOLN: 500.0000 mL | INTRAVENOUS | Status: DC

## 2017-05-15 NOTE — Progress Notes (Signed)
Called to room to assist during endoscopic procedure.  Patient ID and intended procedure confirmed with present staff. Received instructions for my participation in the procedure from the performing physician.  

## 2017-05-15 NOTE — Progress Notes (Signed)
Pt's states no medical or surgical changes since previsit or office visit. 

## 2017-05-15 NOTE — Patient Instructions (Signed)
Information on polyps and hemorrhoids given to you today  Await pathology results on biopsies and on polyps removed    YOU HAD AN ENDOSCOPIC PROCEDURE TODAY AT [facility]:   Refer to the procedure report that was given to you for any specific questions about what was found during the examination.  If the procedure report does not answer your questions, please call your gastroenterologist to clarify.  If you requested that your care partner not be given the details of your procedure findings, then the procedure report has been included in a sealed envelope for you to review at your convenience later.  YOU SHOULD EXPECT: Some feelings of bloating in the abdomen. Passage of more gas than usual.  Walking can help get rid of the air that was put into your GI tract during the procedure and reduce the bloating. If you had a lower endoscopy (such as a colonoscopy or flexible sigmoidoscopy) you may notice spotting of blood in your stool or on the toilet paper. If you underwent a bowel prep for your procedure, you may not have a normal bowel movement for a few days.  Please Note:  You might notice some irritation and congestion in your nose or some drainage.  This is from the oxygen used during your procedure.  There is no need for concern and it should clear up in a day or so.  SYMPTOMS TO REPORT IMMEDIATELY:   Following lower endoscopy (colonoscopy or flexible sigmoidoscopy):  Excessive amounts of blood in the stool  Significant tenderness or worsening of abdominal pains  Swelling of the abdomen that is new, acute  Fever of 100F or higher   Following upper endoscopy (EGD)  Vomiting of blood or coffee ground material  New chest pain or pain under the shoulder blades  Painful or persistently difficult swallowing  New shortness of breath  Fever of 100F or higher  Black, tarry-looking stools  For urgent or emergent issues, a gastroenterologist can be reached at any hour by  calling [phone].   DIET:  We do recommend a small meal at first, but then you may proceed to your regular diet.  Drink plenty of fluids but you should avoid alcoholic beverages for 24 hours.  ACTIVITY:  You should plan to take it easy for the rest of today and you should NOT DRIVE or use heavy machinery until tomorrow (because of the sedation medicines used during the test).    FOLLOW UP: Our staff will call the number listed on your records the next business day following your procedure to check on you and address any questions or concerns that you may have regarding the information given to you following your procedure. If we do not reach you, we will leave a message.  However, if you are feeling well and you are not experiencing any problems, there is no need to return our call.  We will assume that you have returned to your regular daily activities without incident.  If any biopsies were taken you will be contacted by phone or by letter within the next 1-3 weeks.  Please call us at [phone] if you have not heard about the biopsies in 3 weeks.    SIGNATURES/CONFIDENTIALITY: You and/or your care partner have signed paperwork which will be entered into your electronic medical record.  These signatures attest to the fact that that the information above on your After Visit Summary has been reviewed and is understood.  Full responsibility of the  confidentiality of this discharge information lies with you and/or your care-partner.   

## 2017-05-15 NOTE — Progress Notes (Signed)
To PACU, VSS. Report to [email or web address] 

## 2017-05-15 NOTE — Op Note (Signed)
[name] Patient Name: Stanley Bennett Procedure Date: 05/15/2017 7:47 AM MRN: [id number] Endoscopist: Docia Chuck. Henrene Pastor , MD Age: 61 Referring MD:  Date of Birth: [birth year] Gender: Male Account #: [id number] Procedure:                Upper GI endoscopy Indications:              Esophageal reflux Medicines:                Monitored Anesthesia Care Procedure:                Pre-Anesthesia Assessment:                           - Prior to the procedure, a History and Physical                            was performed, and patient medications and                            allergies were reviewed. The patient's tolerance of                            previous anesthesia was also reviewed. The risks                            and benefits of the procedure and the sedation                            options and risks were discussed with the patient.                            All questions were answered, and informed consent                            was obtained. Prior Anticoagulants: The patient has                            taken no previous anticoagulant or antiplatelet                            agents. ASA Grade Assessment: II - A patient with                            mild systemic disease. After reviewing the risks                            and benefits, the patient was deemed in                            satisfactory condition to undergo the procedure.                           After obtaining informed consent, the endoscope was  passed under direct vision. Throughout the                            procedure, the patient's blood pressure, pulse, and                            oxygen saturations were monitored continuously. The                            Endoscope was introduced through the mouth, and                            advanced to the second part of duodenum. The upper                            GI endoscopy was accomplished  without difficulty.                            The patient tolerated the procedure well. Scope In: Scope Out: Findings:                 The esophagus was normal.                           The stomach was normal.                           The examined duodenum was normal.                           The cardia and gastric fundus were normal on                            retroflexion. Complications:            No immediate complications. Estimated Blood Loss:     Estimated blood loss: none. Impression:               - Normal esophagus.                           - Normal stomach.                           - Normal examined duodenum.                           - No specimens collected. Recommendation:           - Patient has a contact number available for                            emergencies. The signs and symptoms of potential                            delayed complications were discussed with the  patient. Return to normal activities tomorrow.                            Written discharge instructions were provided to the                            patient.                           - Resume previous diet.                           - Continue present medications. Docia Chuck. Henrene Pastor, MD 05/15/2017 8:58:28 AM This report has been signed electronically.

## 2017-05-15 NOTE — Op Note (Signed)
[facility] Patient Name: Stanley Bennett Procedure Date: 05/15/2017 7:47 AM MRN: [id number] Endoscopist: Docia Chuck. Henrene Pastor , MD Age: 61 Referring MD:  Date of Birth: [birth year] Gender: Male Account #: [id number] Procedure:                Colonoscopy, with cold snare polypectomy x 4 and                            biopsies Indications:              Screening for colorectal malignant neoplasm,                            Incidental diarrhea noted(has been better since                            office visit) Medicines:                Monitored Anesthesia Care Procedure:                Pre-Anesthesia Assessment:                           - Prior to the procedure, a History and Physical                            was performed, and patient medications and                            allergies were reviewed. The patient's tolerance of                            previous anesthesia was also reviewed. The risks                            and benefits of the procedure and the sedation                            options and risks were discussed with the patient.                            All questions were answered, and informed consent                            was obtained. Prior Anticoagulants: The patient has                            taken no previous anticoagulant or antiplatelet                            agents. ASA Grade Assessment: II - A patient with                            mild systemic disease. After reviewing the risks  and benefits, the patient was deemed in                            satisfactory condition to undergo the procedure.                           After obtaining informed consent, the colonoscope                            was passed under direct vision. Throughout the                            procedure, the patient's blood pressure, pulse, and                            oxygen saturations were monitored continuously. The                         Colonoscope was introduced through the anus and                            advanced to the the cecum, identified by                            appendiceal orifice and ileocecal valve. The                            ileocecal valve, appendiceal orifice, and rectum                            were photographed. The quality of the bowel                            preparation was excellent. The colonoscopy was                            performed without difficulty. The patient tolerated                            the procedure well. The bowel preparation used was                            SUPREP. Scope In: 8:19:31 AM Scope Out: 8:43:20 AM Scope Withdrawal Time: 0 hours 19 minutes 23 seconds  Total Procedure Duration: 0 hours 23 minutes 49 seconds  Findings:                 Four sessile polyps were found in the descending                            colon, transverse colon and ascending colon. The                            polyps were 3 to 5 mm in size. These polyps were  removed with a cold snare. Resection and retrieval                            were complete.                           Internal hemorrhoids were found during retroflexion.                           The entire examined colon appeared otherwise normal                            on direct and retroflexion views. Biopsies for                            histology were taken with a cold forceps from the                            entire colon for evaluation of microscopic colitis. Complications:            No immediate complications. Estimated blood loss:                            None. Estimated Blood Loss:     Estimated blood loss: none. Impression:               - Four 3 to 5 mm polyps in the descending colon, in                            the transverse colon and in the ascending colon,                            removed with a cold snare. Resected and retrieved.                            - Internal hemorrhoids.                           - The entire examined colon is otherwise normal on                            direct and retroflexion views. Random colon                            biopsies obtained. Recommendation:           - Repeat colonoscopy in 3 - 5 years for                            surveillance.                           - Patient has a contact number available for                            emergencies. The signs and  symptoms of potential                            delayed complications were discussed with the                            patient. Return to normal activities tomorrow.                            Written discharge instructions were provided to the                            patient.                           - Resume previous diet.                           - Continue present medications.                           - Await pathology results.                           - EGD today. Please see report Docia Chuck. Henrene Pastor, MD 05/15/2017 8:56:37 AM This report has been signed electronically.

## 2017-05-16 ENCOUNTER — Telehealth: Payer: Self-pay

## 2017-05-16 NOTE — Telephone Encounter (Signed)
  Follow up Call-  Call back number 05/15/2017  Post procedure Call Back phone  # [phone]  Permission to leave phone message Yes  Some recent data might be hidden     Patient questions:  Do you have a fever, pain , or abdominal swelling? No. Pain Score  0 *  Have you tolerated food without any problems? Yes.    Have you been able to return to your normal activities? Yes.    Do you have any questions about your discharge instructions: Diet   No. Medications  No. Follow up visit  No.  Do you have questions or concerns about your Care? No.  Actions: * If pain score is 4 or above: No action needed, pain <4.  No problems noted per pt. maw

## 2017-05-18 ENCOUNTER — Encounter: Payer: Self-pay | Admitting: Internal Medicine

## 2017-06-05 NOTE — Progress Notes (Signed)
HPI:  Stanley Bennett is a pleasant 61 y.o. here for follow up. Chronic medical problems summarized below were reviewed for changes. Doing well. No complaints today. Denies CP, further flares knee pian, acid reflux, SOB, DOE, treatment intolerance or new symptoms.  HLD: -takes simvastatin, takes only 3 days per week -no hx CAD, DM, HTN -regular exercise, healthy diet - walks 5 miles per day  GERD: -hx hiatal hernia -take pantoprazole 40mg   -feels bad if misses doses - never tried lower dose -no hx dysphagia, stricture, gastritis, barrettes, etc  Gout: -on allopurinol, no flare since started a long time ago  Allergies: -sees Dr. Remus Blake -on allergy shot -flonase and aller-tec  ED: -uses cialis 20mg  as needed  Bilat knee pain: -for several years -better with activity, occ feels swollen - when had the flu -tolerable, not worsening -saw [facility] in 2017  - declined rheum labs,but agrees to do if has another flare  ROS: See pertinent positives and negatives per HPI.  Past Medical History:  Diagnosis Date  . Allergy   . Gout   . Hyperlipidemia     Past Surgical History:  Procedure Laterality Date  . APPENDECTOMY    . CERVICAL DISC SURGERY    . TONSILLECTOMY      Family History  Problem Relation Age of Onset  . Arthritis Mother   . Arthritis Father   . Heart disease Father   . Cancer Sister        ovarian  . Diabetes Sister   . Kidney failure Cousin        pat cousin  . Other Brother        celiac sprue  . Kidney failure Cousin        pat cousin  . Colon cancer Neg Hx   . Esophageal cancer Neg Hx   . Rectal cancer Neg Hx     Social History   Social History  . Marital status: Married    Spouse name: [name]  . Number of children: 3  . 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 Smoking status: Never Smoker  . Smokeless tobacco: Never Used  . Alcohol use 6.0  oz/week    10 Cans of beer per week     Comment: 2 - 5 days per week  . Drug use: No  . Sexual activity: Yes    Partners: Female   Other Topics Concern  . None   Social History Narrative   Work or School: [employer], 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      Home Situation: lives with wife      Spiritual Beliefs: Catholic      Lifestyle: regular exercise and healthy diet        Current Outpatient Prescriptions:  .  allopurinol (ZYLOPRIM) 300 MG tablet, Take 1 tablet (300 mg total) by mouth daily. as directed, Disp: 90 tablet, Rfl: 3 .  Cetirizine HCl (KLS ALLER-TEC PO), Take by mouth., Disp: , Rfl:  .  fluticasone (FLONASE) 50 MCG/ACT nasal spray, Place 1 spray into both nostrils 2 (two) times daily. , Disp: , Rfl:  .  meclizine (ANTIVERT) 25 MG tablet, Take 1 tablet (25 mg total) by mouth 3 (three) times daily as needed for dizziness., Disp: 30 tablet, Rfl: 0 .  naproxen (NAPROSYN) 500 MG tablet, Take 1 tablet (500 mg total) by mouth 2 (two) times daily with a meal.,  Disp: 14 tablet, Rfl: 0 .  pantoprazole (PROTONIX) 20 MG tablet, Take 1 tablet (20 mg total) by mouth daily before breakfast., Disp: 90 tablet, Rfl: 3 .  PRESCRIPTION MEDICATION, Allergy shots every week, Disp: , Rfl:  .  sildenafil (REVATIO) 20 MG tablet, Take 1-4 tablets as needed for sexual activity., Disp: 50 tablet, Rfl: 0 .  simvastatin (ZOCOR) 40 MG tablet, Take 1 tablet (40 mg total) by mouth every evening., Disp: 90 tablet, Rfl: 3 .  tadalafil (CIALIS) 20 MG tablet, Take 1 tablet (20 mg total) by mouth daily as needed for erectile dysfunction., Disp: 10 tablet, Rfl: 3  EXAM:  Vitals:   06/06/17 0946  BP: 108/78  Pulse: (!) 55  Temp: 97.6 F (36.4 C)    Body mass index is 26.32 kg/m.  GENERAL: vitals reviewed and listed above, alert, oriented, appears well hydrated and in no acute distress  HEENT: atraumatic, conjunttiva clear, no obvious abnormalities on inspection of external nose and  ears  NECK: no obvious masses on inspection  LUNGS: clear to auscultation bilaterally, no wheezes, rales or rhonchi, good air movement  CV: HRRR, no peripheral edema  MS: moves all extremities without noticeable abnormality  PSYCH: pleasant and cooperative, no obvious depression or anxiety  ASSESSMENT AND PLAN:  Discussed the following assessment and plan:  Hyperlipidemia, unspecified hyperlipidemia type  Gastroesophageal reflux disease, esophagitis presence not specified  Gout, unspecified cause, unspecified chronicity, unspecified site  -consider decreasing PPI dose to low dose to see if symptoms controlled with this -cont other treatments -consider rheum labs if further flares in knee pain, none since saw ortho -lifestyle recs -CPE in April -Patient advised to return or notify a doctor immediately if symptoms worsen or persist or new concerns arise.  Patient Instructions  BEFORE YOU LEAVE: -follow up: 6 months for physical, come fasting if possible  Try going down to 20mg  daily on the protonix. Let me know if this doesn't work and we have to bump it back up.  Advise regular aerobic exercise (at least 150 minutes per week of sweaty exercise) and a healthy diet. Try to eat at least 5-9 servings of vegetables and fruits per day (not corn, potatoes or bananas.) Avoid sweets, red meat, pork, butter, fried foods, fast food, processed food, excessive dairy, eggs and coconut. Replace bad fats with good fats - fish, nuts and seeds, canola oil, olive oil.   WE NOW OFFER   [facility] FAST TRACK!!!  SAME DAY Appointments for ACUTE CARE  Such as: Sprains, Injuries, cuts, abrasions, rashes, muscle pain, joint pain, back pain Colds, flu, sore throats, headache, allergies, cough, fever  Ear pain, sinus and eye infections Abdominal pain, nausea, vomiting, diarrhea, upset stomach Animal/insect bites  3 Easy Ways to Schedule: Walk-In Scheduling Call in  scheduling Mychart Sign-up: [email or web address]            Colin Benton R., DO

## 2017-06-06 ENCOUNTER — Ambulatory Visit (INDEPENDENT_AMBULATORY_CARE_PROVIDER_SITE_OTHER): Payer: BLUE CROSS/BLUE SHIELD | Admitting: Family Medicine

## 2017-06-06 ENCOUNTER — Encounter: Payer: Self-pay | Admitting: Family Medicine

## 2017-06-06 VITALS — BP 108/78 | HR 55 | Temp 97.6°F | Ht 69.5 in | Wt 180.8 lb

## 2017-06-06 DIAGNOSIS — K219 Gastro-esophageal reflux disease without esophagitis: Secondary | ICD-10-CM

## 2017-06-06 DIAGNOSIS — M109 Gout, unspecified: Secondary | ICD-10-CM | POA: Diagnosis not present

## 2017-06-06 DIAGNOSIS — E785 Hyperlipidemia, unspecified: Secondary | ICD-10-CM | POA: Diagnosis not present

## 2017-06-06 MED ORDER — PANTOPRAZOLE SODIUM 20 MG PO TBEC: 20.0000 mg | DELAYED_RELEASE_TABLET | Freq: Every day | ORAL | 3 refills | Status: DC

## 2017-06-06 NOTE — Patient Instructions (Addendum)
BEFORE YOU LEAVE: -follow up: 6 months for physical, come fasting if possible  Try going down to 20mg  daily on the protonix. Let me know if this doesn't work and we have to bump it back up.  Advise regular aerobic exercise (at least 150 minutes per week of sweaty exercise) and a healthy diet. Try to eat at least 5-9 servings of vegetables and fruits per day (not corn, potatoes or bananas.) Avoid sweets, red meat, pork, butter, fried foods, fast food, processed food, excessive dairy, eggs and coconut. Replace bad fats with good fats - fish, nuts and seeds, canola oil, olive oil.   WE NOW OFFER   [facility] FAST TRACK!!!  SAME DAY Appointments for ACUTE CARE  Such as: Sprains, Injuries, cuts, abrasions, rashes, muscle pain, joint pain, back pain Colds, flu, sore throats, headache, allergies, cough, fever  Ear pain, sinus and eye infections Abdominal pain, nausea, vomiting, diarrhea, upset stomach Animal/insect bites  3 Easy Ways to Schedule: Walk-In Scheduling Call in scheduling Mychart Sign-up: [email or web address]

## 2017-08-24 ENCOUNTER — Encounter: Payer: Self-pay | Admitting: Family Medicine

## 2017-09-01 ENCOUNTER — Ambulatory Visit: Payer: BLUE CROSS/BLUE SHIELD | Admitting: Internal Medicine

## 2017-09-01 ENCOUNTER — Encounter: Payer: Self-pay | Admitting: Internal Medicine

## 2017-09-01 VITALS — BP 112/82 | HR 98 | Temp 97.9°F | Wt 181.0 lb

## 2017-09-01 DIAGNOSIS — B9789 Other viral agents as the cause of diseases classified elsewhere: Secondary | ICD-10-CM | POA: Diagnosis not present

## 2017-09-01 DIAGNOSIS — J069 Acute upper respiratory infection, unspecified: Secondary | ICD-10-CM | POA: Diagnosis not present

## 2017-09-01 DIAGNOSIS — Z889 Allergy status to unspecified drugs, medicaments and biological substances status: Secondary | ICD-10-CM | POA: Diagnosis not present

## 2017-09-01 MED ORDER — PREDNISONE 20 MG PO TABS: 20.0000 mg | ORAL_TABLET | Freq: Two times a day (BID) | ORAL | 0 refills | Status: DC

## 2017-09-01 NOTE — Patient Instructions (Addendum)
I suspect that this is recovering viral respiratory infection .    Trying to recover   Stay on allergy meds  Saline flonase   And try chlorpheniramine at night for  Cough   Drainage  May help.   Consideration of  Prednisone  Burst if  persistent or progressive  Fu DR KIM if    Not a lot better in another 1-2 weeks.

## 2017-09-01 NOTE — Progress Notes (Signed)
Chief Complaint  Patient presents with  . Cough    x 1 week. Pt states that he is unable to sleep d/t the cough. Started with sore throat, went away after 1 day. Some runny nose and mucua production - clear in color. Little head congestion. Using delsym. no fever.  Beginning to get energy back    HPI: Stanley Bennett 62 y.o. SDA  PCP NA   See above   Very hard to stop coughing  At night and thus hasnt slept well. Early on light  congestion li  on flonase  But  Nagging and   ticke in mid throat and some ocass clear phlegm.   Wife   No sob.  oin fact energyu getting better able to do over mile walking  wo problem  No hemoptysis     Past hx  asthma   Tobacco .  [pos allergies    Takes  Shots    [place].   5 year.  ROS: See pertinent positives and negatives per HPI. Family had coughing illness and rx with z pack and prednisone     Past Medical History:  Diagnosis Date  . Allergy   . Gout   . Hyperlipidemia     Family History  Problem Relation Age of Onset  . Arthritis Mother   . Arthritis Father   . Heart disease Father   . Cancer Sister        ovarian  . Diabetes Sister   . Kidney failure Cousin        pat cousin  . Other Brother        celiac sprue  . Kidney failure Cousin        pat cousin  . Colon cancer Neg Hx   . Esophageal cancer Neg Hx   . Rectal cancer Neg Hx     Social History   Socioeconomic History  . Marital status: Married    Spouse name: Stanley Bennett  . Number of children: 3  . Years of education: Not on file  . Highest education level: Not on file  Social Needs  . Financial resource strain: Not on file  . Food insecurity - worry: Not on file  . Food insecurity - inability: Not on file  . Transportation needs - medical: Not on file  . Transportation needs - non-medical: Not on file  Occupational History  . Occupation: Development worker, international aid  Tobacco Use  . Smoking status: Never Smoker  . Smokeless tobacco: Never Used  Substance and  Sexual Activity  . Alcohol use: Yes    Alcohol/week: 6.0 oz    Types: 10 Cans of beer per week    Comment: 2 - 5 days per week  . Drug use: No  . Sexual activity: Yes    Partners: Female  Other Topics Concern  . Not on file  Social History Narrative   Work or School: [place], 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      Home Situation: lives with wife      Spiritual Beliefs: Catholic      Lifestyle: regular exercise and healthy diet    Outpatient Medications Prior to Visit  Medication Sig Dispense Refill  . allopurinol (ZYLOPRIM) 300 MG tablet Take 1 tablet (300 mg total) by mouth daily. as directed 90 tablet 3  . Cetirizine HCl (KLS ALLER-TEC PO) Take by mouth.    . fluticasone (FLONASE) 50 MCG/ACT nasal spray Place 1 spray into both nostrils 2 (two) times daily.     Marland Kitchen  meclizine (ANTIVERT) 25 MG tablet Take 1 tablet (25 mg total) by mouth 3 (three) times daily as needed for dizziness. 30 tablet 0  . naproxen (NAPROSYN) 500 MG tablet Take 1 tablet (500 mg total) by mouth 2 (two) times daily with a meal. 14 tablet 0  . pantoprazole (PROTONIX) 20 MG tablet Take 1 tablet (20 mg total) by mouth daily before breakfast. 90 tablet 3  . PRESCRIPTION MEDICATION Allergy shots every week    . sildenafil (REVATIO) 20 MG tablet Take 1-4 tablets as needed for sexual activity. 50 tablet 0  . simvastatin (ZOCOR) 40 MG tablet Take 1 tablet (40 mg total) by mouth every evening. 90 tablet 3  . tadalafil (CIALIS) 20 MG tablet Take 1 tablet (20 mg total) by mouth daily as needed for erectile dysfunction. 10 tablet 3   No facility-administered medications prior to visit.      EXAM:  BP 112/82 (BP Location: Right Arm, Patient Position: Sitting, Cuff Size: Normal)   Pulse 98   Temp 97.9 F (36.6 C) (Oral)   Wt 181 lb (82.1 kg)   BMI 26.35 kg/m   Body mass index is 26.35 kg/m. WDWN in NAD  quiet respirations; mildly congested  somewhat hoarse. Non toxic . HEENT: Normocephalic ;atraumatic ,  Eyes;  PERRL, EOMs  Full, lids and conjunctiva clear,,Ears: no deformities, canals nl, TM landmarks normal, Nose: no deformity or discharge but  minimallucongested;face NT tender Mouth : OP clear without lesion or edema . No pnd seen  Neck: Supple without adenopathy or masses or bruits Chest:  Clear to A&P without wheezes rales or rhonchi CV:  S1-S2 no gallops or murmurs peripheral perfusion is normal Skin :nl perfusion and no acute rashes  No cough during exam    ASSESSMENT AND PLAN:  Discussed the following assessment and plan:  Viral upper respiratory tract infection with cough  H/O seasonal allergies Declines  Narcotic cough med " may like too much"     Trial CTM hs and  If needed add prednisone       With hx of allergy  Poss adding to cough  Say on sinus hygiene regimen  no evidence of bacterial sinusitis  At this time Options discussed   Cont sx rx allergy   Sx  Discussed .   And   Expectant management.  -Patient advised to return or notify health care team  if symptoms worsen ,persist or new concerns arise.  Patient Instructions  I suspect that this is recovering viral respiratory infection .    Trying to recover   Stay on allergy meds  Saline flonase   And try chlorpheniramine at night for  Cough   Drainage  May help.   Consideration of  Prednisone  Burst if  persistent or progressive  Fu DR KIM if    Not a lot better in another 1-2 weeks.   Stanley Bennett. Panosh M.D.

## 2017-10-04 ENCOUNTER — Other Ambulatory Visit: Payer: Self-pay | Admitting: Family Medicine

## 2017-10-20 ENCOUNTER — Telehealth: Payer: Self-pay | Admitting: *Deleted

## 2017-10-20 NOTE — Telephone Encounter (Signed)
Prior auth for Tadalafil 20mg  sent to [email or web address].

## 2017-10-23 NOTE — Telephone Encounter (Signed)
Note in [email or web address] state the Rx was covered.  Approved on March 8  [facility];Review Type:Prior Auth;Coverage Start Date:09/20/2017;Coverage End Date:10/20/2018 and I called [facility] and informed Thurmond Butts of this.

## 2017-10-27 DIAGNOSIS — J3081 Allergic rhinitis due to animal (cat) (dog) hair and dander: Secondary | ICD-10-CM | POA: Diagnosis not present

## 2017-10-27 DIAGNOSIS — J301 Allergic rhinitis due to pollen: Secondary | ICD-10-CM | POA: Diagnosis not present

## 2017-10-30 DIAGNOSIS — J3089 Other allergic rhinitis: Secondary | ICD-10-CM | POA: Diagnosis not present

## 2017-11-10 ENCOUNTER — Encounter: Payer: Self-pay | Admitting: Family Medicine

## 2017-11-10 ENCOUNTER — Other Ambulatory Visit: Payer: Self-pay | Admitting: Family Medicine

## 2017-11-10 MED ORDER — TADALAFIL 20 MG PO TABS: ORAL_TABLET | ORAL | 0 refills | Status: DC

## 2017-11-21 DIAGNOSIS — J301 Allergic rhinitis due to pollen: Secondary | ICD-10-CM | POA: Diagnosis not present

## 2017-11-21 DIAGNOSIS — L209 Atopic dermatitis, unspecified: Secondary | ICD-10-CM | POA: Diagnosis not present

## 2017-11-21 DIAGNOSIS — J3081 Allergic rhinitis due to animal (cat) (dog) hair and dander: Secondary | ICD-10-CM | POA: Diagnosis not present

## 2017-11-21 DIAGNOSIS — J3089 Other allergic rhinitis: Secondary | ICD-10-CM | POA: Diagnosis not present

## 2017-12-10 NOTE — Progress Notes (Signed)
HPI:  Using dictation device. Unfortunately this device frequently misinterprets words/phrases.  Here for CPE:  -Concerns and/or follow up today:   MERCEDES VALERIANO is a pleasant 62 y.o. here for follow up. Chronic medical problems summarized below were reviewed for changes and stability and were updated as needed below. These issues and their treatment remain stable for the most part.Denies gout flares, reflux (unless stops PPI), urinary symptoms, bad allergies (getting shots), CP, SOB, DOE, treatment intolerance or new symptoms. Occ has tingling/numbnes in in medial hand/forearm mainly at night. No weakness of dropping items. Chronic.   HLD: -takes simvastatin, takes only 3 days per week -no hx CAD, DM, HTN -regular exercise, healthy diet - walks 6-8 miles per day, reports eating healthy  GERD: -hx hiatal hernia -take pantoprazole 20mg   -feels bad if misses doses -no hx dysphagia, stricture, gastritis, barrettes, etc  Gout: -on allopurinol, no flare since started a long time ago  Allergies: -sees Dr. Remus Blake -on allergy shot -flonase and aller-tec  ED: -uses cialis 20mg  as needed  Bilat knee pain: -for several years -better with activity, occ feels swollen -tolerable, not worsening -saw [facility] in 2017  - declined rheum labs,but agrees to do if has another flare   -Diet: variety of foods, balance and well rounded, larger portion sizes -Exercise: no regular exercise -Diabetes and Dyslipidemia Screening: -Hx of HTN: no -Vaccines: UTD -sexual activity: yes, male partner, no new partners -wants STI testing, Hep C screening (if born 43-1967): no -FH colon or prstate ca: see FH Last colon cancer screening: 05/2017  Last prostate ca screening:discussed risks/benefits of DRE/PSA, he agreed to PSA -Alcohol, Tobacco, drug use: see social history  Review of Systems - no fevers, unintentional weight loss, vision loss, hearing loss, chest pain, sob,  hemoptysis, melena, hematochezia, hematuria, genital discharge, changing or concerning skin lesions, bleeding, bruising, loc, thoughts of self harm or SI  Past Medical History:  Diagnosis Date  . Allergy   . Gout   . Hyperlipidemia     Past Surgical History:  Procedure Laterality Date  . APPENDECTOMY    . CERVICAL DISC SURGERY    . TONSILLECTOMY      Family History  Problem Relation Age of Onset  . Arthritis Mother   . Arthritis Father   . Heart disease Father   . Cancer Sister        ovarian  . Diabetes Sister   . Kidney failure Cousin        pat cousin  . Other Brother        celiac sprue  . Kidney failure Cousin        pat cousin  . Colon cancer Neg Hx   . Esophageal cancer Neg Hx   . Rectal cancer Neg Hx     Social History   Socioeconomic History  . Marital status: Married    Spouse name: [name]  . Number of children: 3  . Years of education: Not on file  . Highest education level: Not on file  Occupational History  . Occupation: Development worker, international aid  Social Needs  . Financial resource strain: Not on file  . Food insecurity:    Worry: Not on file    Inability: Not on file  . Transportation needs:    Medical: Not on file    Non-medical: Not on file  Tobacco Use  . Smoking status: Never Smoker  . Smokeless tobacco: Never Used  Substance and Sexual Activity  . Alcohol use:  Yes    Alcohol/week: 6.0 oz    Types: 10 Cans of beer per week    Comment: 2 - 5 days per week  . Drug use: No  . Sexual activity: Yes    Partners: Female  Lifestyle  . Physical activity:    Days per week: Not on file    Minutes per session: Not on file  . Stress: Not on file  Relationships  . Social connections:    Talks on phone: Not on file    Gets together: Not on file    Attends religious service: Not on file    Active member of club or organization: Not on file    Attends meetings of clubs or organizations: Not on file    Relationship status: Not on file   Other Topics Concern  . Not on file  Social History Narrative   Work or School: [employer], 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      Home Situation: lives with wife      Spiritual Beliefs: Catholic      Lifestyle: regular exercise and healthy diet     Current Outpatient Medications:  .  allopurinol (ZYLOPRIM) 300 MG tablet, Take 1 tablet (300 mg total) by mouth daily. as directed, Disp: 90 tablet, Rfl: 3 .  Cetirizine HCl (KLS ALLER-TEC PO), Take by mouth., Disp: , Rfl:  .  fluticasone (FLONASE) 50 MCG/ACT nasal spray, Place 1 spray into both nostrils 2 (two) times daily. , Disp: , Rfl:  .  meclizine (ANTIVERT) 25 MG tablet, Take 1 tablet (25 mg total) by mouth 3 (three) times daily as needed for dizziness., Disp: 30 tablet, Rfl: 0 .  naproxen (NAPROSYN) 500 MG tablet, Take 1 tablet (500 mg total) by mouth 2 (two) times daily with a meal., Disp: 14 tablet, Rfl: 0 .  pantoprazole (PROTONIX) 20 MG tablet, Take 1 tablet (20 mg total) by mouth daily before breakfast., Disp: 90 tablet, Rfl: 3 .  PRESCRIPTION MEDICATION, Allergy shots every week, Disp: , Rfl:  .  simvastatin (ZOCOR) 40 MG tablet, Take 1 tablet (40 mg total) by mouth every evening., Disp: 90 tablet, Rfl: 3 .  tadalafil (CIALIS) 20 MG tablet, TAKE 1 TABLET(20 MG) BY MOUTH DAILY AS NEEDED FOR ERECTILE DYSFUNCTION, Disp: 90 tablet, Rfl: 0  EXAM:  Vitals:   12/11/17 0809  BP: 124/80  Pulse: (!) 56  Temp: (!) 97.5 F (36.4 C)  TempSrc: Oral  Weight: 182 lb 3.2 oz (82.6 kg)  Height: 5' 9.25" (1.759 m)    Estimated body mass index is 26.71 kg/m as calculated from the following:   Height as of this encounter: 5' 9.25" (1.759 m).   Weight as of this encounter: 182 lb 3.2 oz (82.6 kg).  GENERAL: vitals reviewed and listed below, alert, oriented, appears well hydrated and in no acute distress  HEENT: head atraumatic, PERRLA, normal appearance of eyes, ears, nose and mouth. moist mucus membranes.  NECK: supple, no masses  or lymphadenopathy  LUNGS: clear to auscultation bilaterally, no rales, rhonchi or wheeze  CV: HRRR, no peripheral edema or cyanosis, normal pedal pulses  ABDOMEN: bowel sounds normal, soft, non tender to palpation, no masses, no rebound or guarding  GU: declined  RECTAL: declined  SKIN: no rash or abnormal lesions  MS: normal gait, moves all extremities normally  NEURO: normal gait, speech and thought processing grossly intact, muscle tone grossly intact throughout  PSYCH: normal affect, pleasant and cooperative  ASSESSMENT AND PLAN:  Discussed the following assessment and plan:  PREVENTIVE EXAM: -Discussed and advised all [place] preventive services health task force level A and B recommendations for age, sex and risks. -Advised at least 150 minutes of exercise per week and a healthy diet with avoidance of (less then 1 serving per week) processed foods, white starches, red meat, fast foods and sweets and consisting of: * 5-9 servings of fresh fruits and vegetables (not corn or potatoes) *nuts and seeds, beans *olives and olive oil *lean meats such as fish and white chicken  *whole grains -labs, studies and vaccines per orders this encounter - Hemoglobin A1c - Lipid panel - PSA - discussed risks/benefit  2. Screening for depression negative  3. Hyperlipidemia, unspecified hyperlipidemia type -labs, refills, lifestyle recs  4. Gastroesophageal reflux disease, esophagitis presence not specified -tolerating lower dose, he has tried off, but does not tolerate  5. Gout, unspecified cause, unspecified chronicity, unspecified site -continue allopurinol -bmp  6. Chronic pain of both knees -likely OA  7. Erectile dysfunction, unspecified erectile dysfunction type -refills  8. Paresthesia -likely mild CTS, suggested cock up wrist splint - Vitamin B12     There are no Patient Instructions on file for this visit.  No follow-ups on file.   Lucretia Kern, DO

## 2017-12-11 ENCOUNTER — Ambulatory Visit (INDEPENDENT_AMBULATORY_CARE_PROVIDER_SITE_OTHER): Payer: BLUE CROSS/BLUE SHIELD | Admitting: Family Medicine

## 2017-12-11 ENCOUNTER — Encounter: Payer: Self-pay | Admitting: Family Medicine

## 2017-12-11 VITALS — BP 124/80 | HR 56 | Temp 97.5°F | Ht 69.25 in | Wt 182.2 lb

## 2017-12-11 DIAGNOSIS — M25561 Pain in right knee: Secondary | ICD-10-CM | POA: Diagnosis not present

## 2017-12-11 DIAGNOSIS — M25562 Pain in left knee: Secondary | ICD-10-CM

## 2017-12-11 DIAGNOSIS — K219 Gastro-esophageal reflux disease without esophagitis: Secondary | ICD-10-CM

## 2017-12-11 DIAGNOSIS — Z0000 Encounter for general adult medical examination without abnormal findings: Secondary | ICD-10-CM | POA: Diagnosis not present

## 2017-12-11 DIAGNOSIS — M109 Gout, unspecified: Secondary | ICD-10-CM

## 2017-12-11 DIAGNOSIS — L989 Disorder of the skin and subcutaneous tissue, unspecified: Secondary | ICD-10-CM

## 2017-12-11 DIAGNOSIS — G8929 Other chronic pain: Secondary | ICD-10-CM | POA: Diagnosis not present

## 2017-12-11 DIAGNOSIS — E785 Hyperlipidemia, unspecified: Secondary | ICD-10-CM | POA: Diagnosis not present

## 2017-12-11 DIAGNOSIS — R202 Paresthesia of skin: Secondary | ICD-10-CM | POA: Diagnosis not present

## 2017-12-11 DIAGNOSIS — Z125 Encounter for screening for malignant neoplasm of prostate: Secondary | ICD-10-CM | POA: Diagnosis not present

## 2017-12-11 DIAGNOSIS — N529 Male erectile dysfunction, unspecified: Secondary | ICD-10-CM | POA: Diagnosis not present

## 2017-12-11 DIAGNOSIS — Z1331 Encounter for screening for depression: Secondary | ICD-10-CM

## 2017-12-11 LAB — BASIC METABOLIC PANEL
BUN: 16 mg/dL (ref 6–23)
CO2: 28 mEq/L (ref 19–32)
Calcium: 9.1 mg/dL (ref 8.4–10.5)
Chloride: 104 mEq/L (ref 96–112)
Creatinine, Ser: 0.9 mg/dL (ref 0.40–1.50)
GFR: 90.43 mL/min (ref >60.00)
Glucose, Bld: 77 mg/dL (ref 70–99)
Potassium: 4.4 mEq/L (ref 3.5–5.1)
Sodium: 141 mEq/L (ref 135–145)

## 2017-12-11 LAB — LIPID PANEL
Cholesterol: 184 mg/dL (ref 0–200)
HDL: 74.4 mg/dL (ref >39.00)
LDL Cholesterol: 98 mg/dL (ref 0–99)
NonHDL: 109.46
Total CHOL/HDL Ratio: 2
Triglycerides: 57 mg/dL (ref 0.0–149.0)
VLDL: 11.4 mg/dL (ref 0.0–40.0)

## 2017-12-11 LAB — PSA: PSA: 1.99 ng/mL (ref 0.10–4.00)

## 2017-12-11 LAB — VITAMIN B12: Vitamin B-12: 434 pg/mL (ref 211–911)

## 2017-12-11 LAB — HEMOGLOBIN A1C: Hgb A1c MFr Bld: 5.8 % (ref 4.6–6.5)

## 2017-12-11 MED ORDER — ALLOPURINOL 300 MG PO TABS: 300.0000 mg | ORAL_TABLET | Freq: Every day | ORAL | 3 refills | Status: DC

## 2017-12-11 MED ORDER — TADALAFIL 20 MG PO TABS: ORAL_TABLET | ORAL | 3 refills | Status: DC

## 2017-12-11 MED ORDER — PANTOPRAZOLE SODIUM 20 MG PO TBEC: 20.0000 mg | DELAYED_RELEASE_TABLET | Freq: Every day | ORAL | 3 refills | Status: DC

## 2017-12-11 MED ORDER — SIMVASTATIN 40 MG PO TABS: 40.0000 mg | ORAL_TABLET | Freq: Every evening | ORAL | 3 refills | Status: DC

## 2017-12-11 NOTE — Patient Instructions (Signed)
BEFORE YOU LEAVE: -labs -follow up: 6 months  We have ordered labs or studies at this visit. It can take up to 1-2 weeks for results and processing. IF results require follow up or explanation, we will call you with instructions. Clinically stable results will be released to your MYCHART. If you have not heard from us or cannot find your results in MYCHART in 2 weeks please contact our office at [phone].  If you are not yet signed up for MYCHART, please consider signing up.   Preventive Care 40-64 Years, Male Preventive care refers to lifestyle choices and visits with your health care provider that can promote health and wellness. What does preventive care include?  A yearly physical exam. This is also called an annual well check.  Dental exams once or twice a year.  Routine eye exams. Ask your health care provider how often you should have your eyes checked.  Personal lifestyle choices, including: ? Daily care of your teeth and gums. ? Regular physical activity. ? Eating a healthy diet. ? Avoiding tobacco and drug use. ? Limiting alcohol use. ? Practicing safe sex. ? Taking low-dose aspirin every day starting at age 50. What happens during an annual well check? The services and screenings done by your health care provider during your annual well check will depend on your age, overall health, lifestyle risk factors, and family history of disease. Counseling Your health care provider may ask you questions about your:  Alcohol use.  Tobacco use.  Drug use.  Emotional well-being.  Home and relationship well-being.  Sexual activity.  Eating habits.  Work and work environment.  Screening You may have the following tests or measurements:  Height, weight, and BMI.  Blood pressure.  Lipid and cholesterol levels. These may be checked every 5 years, or more frequently if you are over 50 years old.  Skin check.  Lung cancer screening. You may have this screening  every year starting at age 55 if you have a 30-pack-year history of smoking and currently smoke or have quit within the past 15 years.  Fecal occult blood test (FOBT) of the stool. You may have this test every year starting at age 50.  Flexible sigmoidoscopy or colonoscopy. You may have a sigmoidoscopy every 5 years or a colonoscopy every 10 years starting at age 50.  Prostate cancer screening. Recommendations will vary depending on your family history and other risks.  Hepatitis C blood test.  Hepatitis B blood test.  Sexually transmitted disease (STD) testing.  Diabetes screening. This is done by checking your blood sugar (glucose) after you have not eaten for a while (fasting). You may have this done every 1-3 years.  Discuss your test results, treatment options, and if necessary, the need for more tests with your health care provider. Vaccines Your health care provider may recommend certain vaccines, such as:  Influenza vaccine. This is recommended every year.  Tetanus, diphtheria, and acellular pertussis (Tdap, Td) vaccine. You may need a Td booster every 10 years.  Varicella vaccine. You may need this if you have not been vaccinated.  Zoster vaccine. You may need this after age 60.  Measles, mumps, and rubella (MMR) vaccine. You may need at least one dose of MMR if you were born in 1957 or later. You may also need a second dose.  Pneumococcal 13-valent conjugate (PCV13) vaccine. You may need this if you have certain conditions and have not been vaccinated.  Pneumococcal polysaccharide (PPSV23) vaccine. You may need one   or two doses if you smoke cigarettes or if you have certain conditions.  Meningococcal vaccine. You may need this if you have certain conditions.  Hepatitis A vaccine. You may need this if you have certain conditions or if you travel or work in places where you may be exposed to hepatitis A.  Hepatitis B vaccine. You may need this if you have certain  conditions or if you travel or work in places where you may be exposed to hepatitis B.  Haemophilus influenzae type b (Hib) vaccine. You may need this if you have certain risk factors.  Talk to your health care provider about which screenings and vaccines you need and how often you need them. This information is not intended to replace advice given to you by your health care provider. Make sure you discuss any questions you have with your health care provider. Document Released: 08/28/2015 Document Revised: 04/20/2016 Document Reviewed: 06/02/2015 Elsevier Interactive Patient Education  2018 Elsevier Inc.        

## 2017-12-11 NOTE — Addendum Note (Signed)
Addended by: Agnes Lawrence on: 12/11/2017 08:49 AM   Modules accepted: Orders

## 2017-12-25 DIAGNOSIS — D485 Neoplasm of uncertain behavior of skin: Secondary | ICD-10-CM | POA: Diagnosis not present

## 2017-12-25 DIAGNOSIS — L570 Actinic keratosis: Secondary | ICD-10-CM | POA: Diagnosis not present

## 2017-12-25 DIAGNOSIS — L578 Other skin changes due to chronic exposure to nonionizing radiation: Secondary | ICD-10-CM | POA: Diagnosis not present

## 2017-12-28 DIAGNOSIS — C4491 Basal cell carcinoma of skin, unspecified: Secondary | ICD-10-CM | POA: Diagnosis not present

## 2018-01-08 DIAGNOSIS — L570 Actinic keratosis: Secondary | ICD-10-CM | POA: Diagnosis not present

## 2018-01-08 DIAGNOSIS — L719 Rosacea, unspecified: Secondary | ICD-10-CM | POA: Diagnosis not present

## 2018-01-08 DIAGNOSIS — C4491 Basal cell carcinoma of skin, unspecified: Secondary | ICD-10-CM | POA: Diagnosis not present

## 2018-01-16 ENCOUNTER — Encounter: Payer: Self-pay | Admitting: Family Medicine

## 2018-01-22 MED ORDER — SILDENAFIL CITRATE 20 MG PO TABS: ORAL_TABLET | ORAL | 0 refills | Status: DC

## 2018-03-19 DIAGNOSIS — C4441 Basal cell carcinoma of skin of scalp and neck: Secondary | ICD-10-CM | POA: Diagnosis not present

## 2018-04-11 DIAGNOSIS — L719 Rosacea, unspecified: Secondary | ICD-10-CM | POA: Diagnosis not present

## 2018-04-11 DIAGNOSIS — Z85828 Personal history of other malignant neoplasm of skin: Secondary | ICD-10-CM | POA: Diagnosis not present

## 2018-04-11 DIAGNOSIS — L570 Actinic keratosis: Secondary | ICD-10-CM | POA: Diagnosis not present

## 2018-04-18 DIAGNOSIS — J301 Allergic rhinitis due to pollen: Secondary | ICD-10-CM | POA: Diagnosis not present

## 2018-04-18 DIAGNOSIS — J3081 Allergic rhinitis due to animal (cat) (dog) hair and dander: Secondary | ICD-10-CM | POA: Diagnosis not present

## 2018-04-19 DIAGNOSIS — J3089 Other allergic rhinitis: Secondary | ICD-10-CM | POA: Diagnosis not present

## 2018-06-19 ENCOUNTER — Encounter: Payer: Self-pay | Admitting: Family Medicine

## 2018-06-19 NOTE — Telephone Encounter (Signed)
Please call pt. Yes, I take family and friends of current patients. Please help to schedule 30 minute NPV. Please let them know I do not prescribe controlled medications.

## 2018-08-03 DIAGNOSIS — L719 Rosacea, unspecified: Secondary | ICD-10-CM | POA: Diagnosis not present

## 2018-08-03 DIAGNOSIS — Z85828 Personal history of other malignant neoplasm of skin: Secondary | ICD-10-CM | POA: Diagnosis not present

## 2018-08-03 DIAGNOSIS — L570 Actinic keratosis: Secondary | ICD-10-CM | POA: Diagnosis not present

## 2018-08-03 DIAGNOSIS — B078 Other viral warts: Secondary | ICD-10-CM | POA: Diagnosis not present

## 2018-08-28 ENCOUNTER — Encounter: Payer: Self-pay | Admitting: Family Medicine

## 2018-09-04 ENCOUNTER — Ambulatory Visit (INDEPENDENT_AMBULATORY_CARE_PROVIDER_SITE_OTHER): Payer: BLUE CROSS/BLUE SHIELD

## 2018-09-04 ENCOUNTER — Encounter: Payer: Self-pay | Admitting: Family Medicine

## 2018-09-04 ENCOUNTER — Ambulatory Visit: Payer: BLUE CROSS/BLUE SHIELD | Admitting: Family Medicine

## 2018-09-04 VITALS — BP 120/80 | HR 59 | Temp 98.2°F | Ht 69.25 in | Wt 183.0 lb

## 2018-09-04 DIAGNOSIS — R739 Hyperglycemia, unspecified: Secondary | ICD-10-CM | POA: Diagnosis not present

## 2018-09-04 DIAGNOSIS — M545 Low back pain: Secondary | ICD-10-CM | POA: Diagnosis not present

## 2018-09-04 DIAGNOSIS — M5441 Lumbago with sciatica, right side: Secondary | ICD-10-CM

## 2018-09-04 DIAGNOSIS — R350 Frequency of micturition: Secondary | ICD-10-CM | POA: Diagnosis not present

## 2018-09-04 DIAGNOSIS — G8929 Other chronic pain: Secondary | ICD-10-CM | POA: Diagnosis not present

## 2018-09-04 LAB — POC URINALSYSI DIPSTICK (AUTOMATED)
Bilirubin, UA: NEGATIVE
Blood, UA: NEGATIVE
Glucose, UA: NEGATIVE
Ketones, UA: NEGATIVE
Leukocytes, UA: NEGATIVE
Nitrite, UA: NEGATIVE
Protein, UA: NEGATIVE
Spec Grav, UA: 1.01 (ref 1.010–1.025)
Urobilinogen, UA: 0.2 E.U./dL
pH, UA: 7.5 (ref 5.0–8.0)

## 2018-09-04 NOTE — Addendum Note (Signed)
Addended by: Agnes Lawrence on: 09/04/2018 04:23 PM   Modules accepted: Orders

## 2018-09-04 NOTE — Progress Notes (Signed)
HPI:  Using dictation device. Unfortunately this device frequently misinterprets words/phrases.  Acute visit for:  Chronic back pain: -hx ddd, hx fusion for neck remotely -has had for many years, not worse, but he is getting tired of it -R low back to buttock most significant, stiff in the mornings, some constant paresthesias R lat leg, mod discomfort on constant basis -takes aleve which helps some -stays active -no fevers, malaise, weakness, bowel or bladder incontinence, wt loss  Urinary frequency: -x 2 weeks -no fevers, dysuria, pain, malaise, hematuria -hx mild hyperglycemia -PSA ok 8 months ago   ROS: See pertinent positives and negatives per HPI.  Past Medical History:  Diagnosis Date  . Allergy   . Gout   . Hyperlipidemia     Past Surgical History:  Procedure Laterality Date  . APPENDECTOMY    . CERVICAL DISC SURGERY    . TONSILLECTOMY      Family History  Problem Relation Age of Onset  . Arthritis Mother   . Arthritis Father   . Heart disease Father   . Cancer Sister        ovarian  . Diabetes Sister   . Kidney failure Cousin        pat cousin  . Other Brother        celiac sprue  . Kidney failure Cousin        pat cousin  . Colon cancer Neg Hx   . Esophageal cancer Neg Hx   . Rectal cancer Neg Hx     SOCIAL HX: see hpi   Current Outpatient Medications:  .  allopurinol (ZYLOPRIM) 300 MG tablet, Take 1 tablet (300 mg total) by mouth daily. as directed, Disp: 90 tablet, Rfl: 3 .  Cetirizine HCl (KLS ALLER-TEC PO), Take by mouth., Disp: , Rfl:  .  fluticasone (FLONASE) 50 MCG/ACT nasal spray, Place 1 spray into both nostrils 2 (two) times daily. , Disp: , Rfl:  .  meclizine (ANTIVERT) 25 MG tablet, Take 1 tablet (25 mg total) by mouth 3 (three) times daily as needed for dizziness., Disp: 30 tablet, Rfl: 0 .  naproxen (NAPROSYN) 500 MG tablet, Take 1 tablet (500 mg total) by mouth 2 (two) times daily with a meal., Disp: 14 tablet, Rfl: 0 .   pantoprazole (PROTONIX) 20 MG tablet, Take 1 tablet (20 mg total) by mouth daily before breakfast., Disp: 90 tablet, Rfl: 3 .  PRESCRIPTION MEDICATION, Allergy shots every week, Disp: , Rfl:  .  sildenafil (REVATIO) 20 MG tablet, Take 1-3 tablets as needed prior to sexual intercourse, Disp: 50 tablet, Rfl: 0 .  simvastatin (ZOCOR) 40 MG tablet, Take 1 tablet (40 mg total) by mouth every evening., Disp: 90 tablet, Rfl: 3 .  tadalafil (CIALIS) 20 MG tablet, TAKE 1 TABLET(20 MG) BY MOUTH DAILY AS NEEDED FOR ERECTILE DYSFUNCTION, Disp: 90 tablet, Rfl: 3  EXAM:  Vitals:   09/04/18 1529  BP: 120/80  Pulse: (!) 59  Temp: 98.2 F (36.8 C)  SpO2: 98%    Body mass index is 26.83 kg/m.  GENERAL: vitals reviewed and listed above, alert, oriented, appears well hydrated and in no acute distress  HEENT: atraumatic, conjunttiva clear, no obvious abnormalities on inspection of external nose and ears  NECK: no obvious masses on inspection  LUNGS: clear to auscultation bilaterally, no wheezes, rales or rhonchi, good air movement  CV: HRRR, no peripheral edema  MS: moves all extremities without noticeable abnormality Normal Gait Normal inspection of back, no obvious  scoliosis or leg length descrepancy No bony TTP Soft tissue TTP at: no sig TTP on exam today -/+ tests: neg trendelenburg,-facet loading, -SLRT, -CLRT, -FABER, -FADIR Normal muscle strength, sensation to light touch and DTRs in LEs bilaterally  PSYCH: pleasant and cooperative, no obvious depression or anxiety  ASSESSMENT AND PLAN:  Discussed the following assessment and plan:  Chronic right-sided low back pain with right-sided sciatica  Urinary frequency  Hyperglycemia  -we discussed possible serious and likely etiologies, workup and treatment, treatment risks and return precautions for his symptoms. For the back recommended evaluation with plain films and likely eval with PMR or ortho given chronicity and radicular  symptoms. For the urine doing urine studies and checking sugar today. Possible urology eval if unrevealing. -after this discussion, Stanley Bennett opted for xray, labs, urine studies - treatment/further eval pending -follow up advised 1 months -of course, we advised Stanley Bennett  to return or notify a doctor immediately if symptoms worsen or persist or new concerns arise.    Patient Instructions  BEFORE YOU LEAVE: -low back exercises -udip with reflex micro/culture -xray -lab -follow up: 1 month  Get the xray of the back. Depending on findings likely would recommend referral to back specialist, or trying physical therapy and OMM (osteopathic musculoskeletal medicine).   Can do the exercises 4 days per week in the interim.  Getting some labs and urine studies to evaluate the urinary frequency.  We will let you know about these results in the next 5-7 days. Please call us if you do not receive your results and further instructions.   We hope you get to feeling better soon! Follow up sooner if symptoms worsen or new concerns arise.   Lucretia Kern, DO

## 2018-09-04 NOTE — Patient Instructions (Addendum)
BEFORE YOU LEAVE: -low back exercises -udip with reflex micro/culture -xray -lab -follow up: 1 month  Get the xray of the back. Depending on findings likely would recommend referral to back specialist, or trying physical therapy and OMM (osteopathic musculoskeletal medicine).   Can do the exercises 4 days per week in the interim.  Getting some labs and urine studies to evaluate the urinary frequency.  We will let you know about these results in the next 5-7 days. Please call us if you do not receive your results and further instructions.   We hope you get to feeling better soon! Follow up sooner if symptoms worsen or new concerns arise.

## 2018-09-05 LAB — URINALYSIS, ROUTINE W REFLEX MICROSCOPIC
Bilirubin Urine: NEGATIVE
Hgb urine dipstick: NEGATIVE
Ketones, ur: NEGATIVE
Leukocytes, UA: NEGATIVE
Nitrite: NEGATIVE
RBC / HPF: NONE SEEN (ref 0–?)
Specific Gravity, Urine: 1.01 (ref 1.000–1.030)
Total Protein, Urine: NEGATIVE
Urine Glucose: NEGATIVE
Urobilinogen, UA: 0.2 (ref 0.0–1.0)
WBC, UA: NONE SEEN (ref 0–?)
pH: 7.5 (ref 5.0–8.0)

## 2018-09-05 LAB — URINE CULTURE
MICRO NUMBER:: 83454
Result:: NO GROWTH
SPECIMEN QUALITY:: ADEQUATE

## 2018-09-25 ENCOUNTER — Other Ambulatory Visit: Payer: Self-pay | Admitting: *Deleted

## 2018-09-25 MED ORDER — SILDENAFIL CITRATE 20 MG PO TABS: ORAL_TABLET | ORAL | 0 refills | Status: DC

## 2018-09-25 NOTE — Telephone Encounter (Signed)
Rx done. 

## 2018-10-08 ENCOUNTER — Ambulatory Visit: Payer: BLUE CROSS/BLUE SHIELD | Admitting: Family Medicine

## 2018-10-11 ENCOUNTER — Ambulatory Visit: Payer: BLUE CROSS/BLUE SHIELD | Admitting: Family Medicine

## 2018-10-11 ENCOUNTER — Encounter: Payer: Self-pay | Admitting: Family Medicine

## 2018-10-11 VITALS — BP 102/64 | HR 57 | Temp 98.5°F | Ht 69.25 in | Wt 181.9 lb

## 2018-10-11 DIAGNOSIS — H109 Unspecified conjunctivitis: Secondary | ICD-10-CM

## 2018-10-11 MED ORDER — ERYTHROMYCIN 5 MG/GM OP OINT: 1.0000 "application " | TOPICAL_OINTMENT | Freq: Every day | OPHTHALMIC | 0 refills | Status: DC

## 2018-10-11 MED ORDER — AMOXICILLIN-POT CLAVULANATE 875-125 MG PO TABS: 1.0000 | ORAL_TABLET | Freq: Two times a day (BID) | ORAL | 0 refills | Status: DC

## 2018-10-11 NOTE — Patient Instructions (Signed)
Call your eye doctor for recheck tomorrow  Ointment per instructions for 5 days  Antibiotic (augmentin) per instructions  I hope you are feeling better soon! Seek care promptly if your symptoms worsen, new concerns arise or you are not improving with treatment.

## 2018-10-11 NOTE — Progress Notes (Signed)
HPI:  Using dictation device. Unfortunately this device frequently misinterprets words/phrases.  Acute visit for eye irritation: -started yesterday -left eye -irritation, erythema, swelling of lids, drainage mild, som crusting in morning -a work acquaintance had pink eye recently -no trauma or foreign body to eye - wears glasses -no fevers, malaise, illness o/w  ROS: See pertinent positives and negatives per HPI.  Past Medical History:  Diagnosis Date  . Allergy   . Gout   . Hyperlipidemia     Past Surgical History:  Procedure Laterality Date  . APPENDECTOMY    . CERVICAL DISC SURGERY    . TONSILLECTOMY      Family History  Problem Relation Age of Onset  . Arthritis Mother   . Arthritis Father   . Heart disease Father   . Cancer Sister        ovarian  . Diabetes Sister   . Kidney failure Cousin        pat cousin  . Other Brother        celiac sprue  . Kidney failure Cousin        pat cousin  . Colon cancer Neg Hx   . Esophageal cancer Neg Hx   . Rectal cancer Neg Hx     SOCIAL HX: see hpi   Current Outpatient Medications:  .  allopurinol (ZYLOPRIM) 300 MG tablet, Take 1 tablet (300 mg total) by mouth daily. as directed, Disp: 90 tablet, Rfl: 3 .  Cetirizine HCl (KLS ALLER-TEC PO), Take by mouth., Disp: , Rfl:  .  fluticasone (FLONASE) 50 MCG/ACT nasal spray, Place 1 spray into both nostrils 2 (two) times daily. , Disp: , Rfl:  .  meclizine (ANTIVERT) 25 MG tablet, Take 1 tablet (25 mg total) by mouth 3 (three) times daily as needed for dizziness., Disp: 30 tablet, Rfl: 0 .  naproxen (NAPROSYN) 500 MG tablet, Take 1 tablet (500 mg total) by mouth 2 (two) times daily with a meal., Disp: 14 tablet, Rfl: 0 .  pantoprazole (PROTONIX) 20 MG tablet, Take 1 tablet (20 mg total) by mouth daily before breakfast., Disp: 90 tablet, Rfl: 3 .  PRESCRIPTION MEDICATION, Allergy shots every week, Disp: , Rfl:  .  sildenafil (REVATIO) 20 MG tablet, Take 1-3 tablets as needed  prior to sexual intercourse, Disp: 50 tablet, Rfl: 0 .  simvastatin (ZOCOR) 40 MG tablet, Take 1 tablet (40 mg total) by mouth every evening., Disp: 90 tablet, Rfl: 3 .  tadalafil (CIALIS) 20 MG tablet, TAKE 1 TABLET(20 MG) BY MOUTH DAILY AS NEEDED FOR ERECTILE DYSFUNCTION, Disp: 90 tablet, Rfl: 3 .  amoxicillin-clavulanate (AUGMENTIN) 875-125 MG tablet, Take 1 tablet by mouth 2 (two) times daily., Disp: 10 tablet, Rfl: 0 .  erythromycin ophthalmic ointment, Place 1 application into the left eye at bedtime., Disp: 3.5 g, Rfl: 0  EXAM:  Vitals:   10/11/18 1523  BP: 102/64  Pulse: (!) 57  Temp: 98.5 F (36.9 C)    Body mass index is 26.67 kg/m.  GENERAL: vitals reviewed and listed above, alert, oriented, appears well hydrated and in no acute distress  HEENT: atraumatic, conjunttiva with mild erythema, some clear drainage - some purulent drainage corner of eye, ? Stye upper lid, some mild non tender sofe edema of upper and lower lids, no injury/abrasion/foreign body on gross exam, no obvious abnormalities on inspection of external nose and ears, PERRLA, visual acuity grossly intact, EOMI  NECK: no obvious masses on inspection  LUNGS: clear to auscultation bilaterally,  no wheezes, rales or rhonchi, good air movement  CV: HRRR, no peripheral edema  MS: moves all extremities without noticeable abnormality  PSYCH: pleasant and cooperative, no obvious depression or anxiety  ASSESSMENT AND PLAN:  Discussed the following assessment and plan:  Conjunctivitis of left eye, unspecified conjunctivitis type  -we discussed possible serious and likely etiologies, workup and treatment, treatment risks and return precautions -after this discussion, Stanley Bennett opted for erythromycin oint, oral abx for edema lids (though may be more a reaction or inflammation) in case of bacterial infection, compresses and he agrees to also contact his opthomologist, hopefully for recheck tomorrow -follow up advised  with optho asap, here as needed -of course, we advised Wh[place]  to return or notify a doctor immediately if symptoms worsen or persist or new concerns arise.  Patient Instructions  Call your eye doctor for recheck tomorrow  Ointment per instructions for 5 days  Antibiotic (augmentin) per instructions  I hope you are feeling better soon! Seek care promptly if your symptoms worsen, new concerns arise or you are not improving with treatment.     Lucretia Kern, DO

## 2018-10-12 DIAGNOSIS — H0014 Chalazion left upper eyelid: Secondary | ICD-10-CM | POA: Diagnosis not present

## 2018-10-30 DIAGNOSIS — J301 Allergic rhinitis due to pollen: Secondary | ICD-10-CM | POA: Diagnosis not present

## 2018-10-30 DIAGNOSIS — J3081 Allergic rhinitis due to animal (cat) (dog) hair and dander: Secondary | ICD-10-CM | POA: Diagnosis not present

## 2018-10-31 DIAGNOSIS — J3089 Other allergic rhinitis: Secondary | ICD-10-CM | POA: Diagnosis not present

## 2018-11-07 DIAGNOSIS — J3081 Allergic rhinitis due to animal (cat) (dog) hair and dander: Secondary | ICD-10-CM | POA: Diagnosis not present

## 2018-11-07 DIAGNOSIS — J3089 Other allergic rhinitis: Secondary | ICD-10-CM | POA: Diagnosis not present

## 2018-11-07 DIAGNOSIS — J301 Allergic rhinitis due to pollen: Secondary | ICD-10-CM | POA: Diagnosis not present

## 2018-11-09 DIAGNOSIS — L719 Rosacea, unspecified: Secondary | ICD-10-CM | POA: Diagnosis not present

## 2018-11-09 DIAGNOSIS — L570 Actinic keratosis: Secondary | ICD-10-CM | POA: Diagnosis not present

## 2018-11-09 DIAGNOSIS — J301 Allergic rhinitis due to pollen: Secondary | ICD-10-CM | POA: Diagnosis not present

## 2018-11-09 DIAGNOSIS — J3089 Other allergic rhinitis: Secondary | ICD-10-CM | POA: Diagnosis not present

## 2018-11-09 DIAGNOSIS — J3081 Allergic rhinitis due to animal (cat) (dog) hair and dander: Secondary | ICD-10-CM | POA: Diagnosis not present

## 2018-11-15 DIAGNOSIS — J3089 Other allergic rhinitis: Secondary | ICD-10-CM | POA: Diagnosis not present

## 2018-11-15 DIAGNOSIS — J3081 Allergic rhinitis due to animal (cat) (dog) hair and dander: Secondary | ICD-10-CM | POA: Diagnosis not present

## 2018-11-15 DIAGNOSIS — J301 Allergic rhinitis due to pollen: Secondary | ICD-10-CM | POA: Diagnosis not present

## 2018-11-19 DIAGNOSIS — J301 Allergic rhinitis due to pollen: Secondary | ICD-10-CM | POA: Diagnosis not present

## 2018-11-19 DIAGNOSIS — J3089 Other allergic rhinitis: Secondary | ICD-10-CM | POA: Diagnosis not present

## 2018-11-19 DIAGNOSIS — J3081 Allergic rhinitis due to animal (cat) (dog) hair and dander: Secondary | ICD-10-CM | POA: Diagnosis not present

## 2018-11-28 DIAGNOSIS — J3089 Other allergic rhinitis: Secondary | ICD-10-CM | POA: Diagnosis not present

## 2018-11-28 DIAGNOSIS — J301 Allergic rhinitis due to pollen: Secondary | ICD-10-CM | POA: Diagnosis not present

## 2018-11-28 DIAGNOSIS — L209 Atopic dermatitis, unspecified: Secondary | ICD-10-CM | POA: Diagnosis not present

## 2018-11-28 DIAGNOSIS — J3081 Allergic rhinitis due to animal (cat) (dog) hair and dander: Secondary | ICD-10-CM | POA: Diagnosis not present

## 2018-12-04 DIAGNOSIS — J301 Allergic rhinitis due to pollen: Secondary | ICD-10-CM | POA: Diagnosis not present

## 2018-12-04 DIAGNOSIS — J3089 Other allergic rhinitis: Secondary | ICD-10-CM | POA: Diagnosis not present

## 2018-12-04 DIAGNOSIS — J3081 Allergic rhinitis due to animal (cat) (dog) hair and dander: Secondary | ICD-10-CM | POA: Diagnosis not present

## 2018-12-11 DIAGNOSIS — J3089 Other allergic rhinitis: Secondary | ICD-10-CM | POA: Diagnosis not present

## 2018-12-11 DIAGNOSIS — J301 Allergic rhinitis due to pollen: Secondary | ICD-10-CM | POA: Diagnosis not present

## 2018-12-11 DIAGNOSIS — J3081 Allergic rhinitis due to animal (cat) (dog) hair and dander: Secondary | ICD-10-CM | POA: Diagnosis not present

## 2018-12-13 ENCOUNTER — Encounter: Payer: BLUE CROSS/BLUE SHIELD | Admitting: Family Medicine

## 2018-12-19 DIAGNOSIS — J3089 Other allergic rhinitis: Secondary | ICD-10-CM | POA: Diagnosis not present

## 2018-12-19 DIAGNOSIS — J3081 Allergic rhinitis due to animal (cat) (dog) hair and dander: Secondary | ICD-10-CM | POA: Diagnosis not present

## 2018-12-19 DIAGNOSIS — J301 Allergic rhinitis due to pollen: Secondary | ICD-10-CM | POA: Diagnosis not present

## 2018-12-21 ENCOUNTER — Other Ambulatory Visit: Payer: Self-pay | Admitting: Family Medicine

## 2018-12-28 ENCOUNTER — Encounter: Payer: Self-pay | Admitting: Family Medicine

## 2018-12-28 ENCOUNTER — Telehealth: Payer: Self-pay | Admitting: *Deleted

## 2018-12-28 ENCOUNTER — Ambulatory Visit (INDEPENDENT_AMBULATORY_CARE_PROVIDER_SITE_OTHER): Payer: BLUE CROSS/BLUE SHIELD | Admitting: Family Medicine

## 2018-12-28 ENCOUNTER — Other Ambulatory Visit: Payer: Self-pay

## 2018-12-28 DIAGNOSIS — E785 Hyperlipidemia, unspecified: Secondary | ICD-10-CM

## 2018-12-28 DIAGNOSIS — Z889 Allergy status to unspecified drugs, medicaments and biological substances status: Secondary | ICD-10-CM | POA: Diagnosis not present

## 2018-12-28 DIAGNOSIS — Z125 Encounter for screening for malignant neoplasm of prostate: Secondary | ICD-10-CM

## 2018-12-28 DIAGNOSIS — J301 Allergic rhinitis due to pollen: Secondary | ICD-10-CM | POA: Diagnosis not present

## 2018-12-28 DIAGNOSIS — N401 Enlarged prostate with lower urinary tract symptoms: Secondary | ICD-10-CM

## 2018-12-28 DIAGNOSIS — M109 Gout, unspecified: Secondary | ICD-10-CM

## 2018-12-28 DIAGNOSIS — R3912 Poor urinary stream: Secondary | ICD-10-CM

## 2018-12-28 DIAGNOSIS — J3081 Allergic rhinitis due to animal (cat) (dog) hair and dander: Secondary | ICD-10-CM | POA: Diagnosis not present

## 2018-12-28 DIAGNOSIS — K219 Gastro-esophageal reflux disease without esophagitis: Secondary | ICD-10-CM

## 2018-12-28 DIAGNOSIS — R7301 Impaired fasting glucose: Secondary | ICD-10-CM

## 2018-12-28 DIAGNOSIS — M25551 Pain in right hip: Secondary | ICD-10-CM

## 2018-12-28 DIAGNOSIS — J3089 Other allergic rhinitis: Secondary | ICD-10-CM | POA: Diagnosis not present

## 2018-12-28 DIAGNOSIS — N529 Male erectile dysfunction, unspecified: Secondary | ICD-10-CM

## 2018-12-28 MED ORDER — TADALAFIL 5 MG PO TABS: 5.0000 mg | ORAL_TABLET | Freq: Every day | ORAL | 1 refills | Status: DC

## 2018-12-28 NOTE — Telephone Encounter (Signed)
I left a message for the pt to return my call. 

## 2018-12-28 NOTE — Progress Notes (Signed)
Virtual Visit via Video Note  I connected with Stanley Bennett   on 12/28/18 at  8:00 AM EDT by a video enabled telemedicine application and verified that I am speaking with the correct person using two identifiers.  Location patient: home Location provider:work office Persons participating in the virtual visit: patient, provider  I discussed the limitations of evaluation and management by telemedicine and the availability of in person appointments. The patient expressed understanding and agreed to proceed.   Stanley Bennett DOB: 1956/06/11 Encounter date: 12/28/2018  This is a 63 y.o. male who presents to establish care. No chief complaint on file.  History of present illness: Last physical with [facility] was 11/2017 [employer].  GERD:hx hiatal hernia, protonix 20mg , unable to wean off. Upper endoscopy 05/2017 (Dr. Henrene Bennett)- normal. Has been persistent for as long as he can remember.  Gout:on allopurinol - hasn't had bad flare up in quite awhile, If he stops taking the allopurinol he gets pain, stiffness in great toe.   HL: on simvastatin daily. Regular exercise (walking) 6-8 miles daily. [place] to be [place].   Allergies: follows with Dr. Remus Bennett and still getting allergy shots 2/week. Also using the flonase and zyrtec.   Bilat knee pain - better with activity. Saw ortho 2017. Now having right pain down into right knee. Takes advil about 4 nights/week. Feels it during day but not as significant as it is during night. Has had lower back and knee issues in past, but in last 2 months has been a pretty consistent hip pain. Radiates to knee. Occasionally pain into groin, but feels on side of hip. Not tender to touch. Takes a few strides to get back into normal feel. Advil does help with pain. Would be interested at some point at further evaluation.   Ed: sildenafil/cialis - cialis   Hx of elevated glucose with labwork; wnl (77) on last bmp 11/2017.   Dizziness: went  to see Stanley Bennett at neuro rehab for vertigo in past. Occasionally will get a subtle sx, but hasn't had any big issues since doing rehab.   Follows with derm due to basal cell on nose. This was removed and does have regular follow ups with them for recheck.  Past Medical History:  Diagnosis Date  . Allergy   . Basal cell carcinoma    scalp  . Gout   . Hyperlipidemia    Past Surgical History:  Procedure Laterality Date  . APPENDECTOMY    . BASAL CELL CARCINOMA EXCISION    . CERVICAL DISC SURGERY    . TONSILLECTOMY     Allergies  Allergen Reactions  . Tamiflu [Oseltamivir] Rash    Mild rash 2018   No outpatient medications have been marked as taking for the 12/28/18 encounter (Office Visit) with Stanley Macadam, MD.   Social History   Tobacco Use  . Smoking status: Never Smoker  . Smokeless tobacco: Never Used  Substance Use Topics  . Alcohol use: Yes    Alcohol/week: 6.0 standard drinks    Types: 6 Cans of beer per week    Comment: 2 - 5 days per week   Family History  Problem Relation Age of Onset  . Arthritis Mother   . Arthritis Father   . Heart disease Father        (pneumonia cause of death 45 yrs)  . Cancer Sister        ovarian  . Diabetes Sister   .  Kidney failure Cousin        pat cousin  . Other Brother        celiac sprue  . Kidney failure Cousin        pat cousin  . Colon cancer Neg Hx   . Esophageal cancer Neg Hx   . Rectal cancer Neg Hx      Review of Systems  Constitutional: Negative for chills, fatigue and fever.  Respiratory: Negative for cough, chest tightness, shortness of breath and wheezing.   Cardiovascular: Negative for chest pain, palpitations and leg swelling.    Objective:  There were no vitals taken for this visit.      BP Readings from Last 3 Encounters:  10/11/18 102/64  09/04/18 120/80  12/11/17 124/80   Wt Readings from Last 3 Encounters:  10/11/18 181 lb 14.4 oz (82.5 kg)  09/04/18 183 lb (83 kg)   12/11/17 182 lb 3.2 oz (82.6 kg)    EXAM:  GENERAL: alert, oriented, appears well and in no acute distress  HEENT: atraumatic, conjunctiva clear, no obvious abnormalities on inspection of external nose and ears  NECK: normal movements of the head and neck  LUNGS: on inspection no signs of respiratory distress, breathing rate appears normal, no obvious gross SOB, gasping or wheezing  CV: no obvious cyanosis  MS: moves all visible extremities without noticeable abnormality  PSYCH/NEURO: pleasant and cooperative, no obvious depression or anxiety, speech and thought processing grossly intact  SKIN: no obvious skin abnormalities face/neck.  Assessment/Plan  1. Hyperlipidemia, unspecified hyperlipidemia type Continue simvastatin. Recheck labs next week. - Comprehensive metabolic panel; Future - Lipid panel; Future  2. Gastroesophageal reflux disease, esophagitis presence not specified Stable on protonix. Continue medication. Has had EGD was normal in 2018.  3. Gout, unspecified cause, unspecified chronicity, unspecified site Flares are controlled with allopurinol. Will check uric acid levels on bloodwork.  4. H/O seasonal allergies Controlled with flonase, zyrtec, allergy injections.  5. Elevated fasting glucose - Hemoglobin A1c; Future  6. Erectile dysfunction, unspecified erectile dysfunction type Checking testosterone levels. See below - Testosterone; Future  7. Benign prostatic hyperplasia with weak urinary stream Trial of cialis to help with urine stream. Will eval prostate when in person visit is option.  - tadalafil (CIALIS) 5 MG tablet; Take 1 tablet (5 mg total) by mouth daily.  Dispense: 90 tablet; Refill: 1  8. Right hip pain Will start with xray. If significant arthritis can refer on to ortho for eval/treatment. If xray stable, then will eval in office to make further management decision. - DG Hip Unilat W OR W/O Pelvis 1V Right; Future  9. Screening for  prostate cancer Getting repeat baseline in case of need for testosterone replacement. - PSA; Future   Return bloodwork/xray then physical pending results.  I discussed the assessment and treatment plan with the patient. The patient was provided an opportunity to ask questions and all were answered. The patient agreed with the plan and demonstrated an understanding of the instructions.   The patient was advised to call back or seek an in-person evaluation if the symptoms worsen or if the condition fails to improve as anticipated.  I provided 28 minutes of non-face-to-face time during this encounter.   Micheline Rough, MD

## 2018-12-28 NOTE — Telephone Encounter (Signed)
-----   Message from Caren Macadam, MD sent at 12/28/2018  8:33 AM EDT ----- Please schedule bloodwork/xray in upcoming weeks. Suggest morning appt due to testosterone level being checked. Also please let him know that for insurance purpose I am trying the 5mg  daily cialis first (I think coverage will be better). He could try taking daily and see if this is enough to help with urine stream/erection. They will be less likely to cover higher dose so try this first. If daily dosing doesn't work then he can take 2-4 tabs just prn for erection.

## 2018-12-28 NOTE — Telephone Encounter (Signed)
Patient called back and was informed of the message below.  Lab appt scheduled for 5/20.

## 2018-12-31 ENCOUNTER — Telehealth: Payer: Self-pay | Admitting: Family Medicine

## 2018-12-31 NOTE — Telephone Encounter (Signed)
PA initiated today via cover my meds  Key:  [id number]  Stated that this was sent to the plan today for authorization.

## 2019-01-02 ENCOUNTER — Ambulatory Visit (INDEPENDENT_AMBULATORY_CARE_PROVIDER_SITE_OTHER): Payer: BLUE CROSS/BLUE SHIELD

## 2019-01-02 ENCOUNTER — Other Ambulatory Visit (INDEPENDENT_AMBULATORY_CARE_PROVIDER_SITE_OTHER): Payer: BLUE CROSS/BLUE SHIELD

## 2019-01-02 ENCOUNTER — Other Ambulatory Visit: Payer: Self-pay

## 2019-01-02 ENCOUNTER — Other Ambulatory Visit: Payer: Self-pay | Admitting: Family Medicine

## 2019-01-02 DIAGNOSIS — Z125 Encounter for screening for malignant neoplasm of prostate: Secondary | ICD-10-CM

## 2019-01-02 DIAGNOSIS — M25551 Pain in right hip: Secondary | ICD-10-CM

## 2019-01-02 DIAGNOSIS — N529 Male erectile dysfunction, unspecified: Secondary | ICD-10-CM

## 2019-01-02 DIAGNOSIS — E785 Hyperlipidemia, unspecified: Secondary | ICD-10-CM | POA: Diagnosis not present

## 2019-01-02 DIAGNOSIS — R7301 Impaired fasting glucose: Secondary | ICD-10-CM

## 2019-01-02 DIAGNOSIS — M160 Bilateral primary osteoarthritis of hip: Secondary | ICD-10-CM | POA: Diagnosis not present

## 2019-01-02 LAB — LIPID PANEL
Cholesterol: 186 mg/dL (ref 0–200)
HDL: 80 mg/dL (ref >39.00)
LDL Cholesterol: 86 mg/dL (ref 0–99)
NonHDL: 106.36
Total CHOL/HDL Ratio: 2
Triglycerides: 104 mg/dL (ref 0.0–149.0)
VLDL: 20.8 mg/dL (ref 0.0–40.0)

## 2019-01-02 LAB — COMPREHENSIVE METABOLIC PANEL
ALT: 20 U/L (ref 0–53)
AST: 24 U/L (ref 0–37)
Albumin: 4.1 g/dL (ref 3.5–5.2)
Alkaline Phosphatase: 68 U/L (ref 39–117)
BUN: 12 mg/dL (ref 6–23)
CO2: 28 mEq/L (ref 19–32)
Calcium: 9 mg/dL (ref 8.4–10.5)
Chloride: 103 mEq/L (ref 96–112)
Creatinine, Ser: 0.9 mg/dL (ref 0.40–1.50)
GFR: 84.79 mL/min (ref >60.00)
Glucose, Bld: 89 mg/dL (ref 70–99)
Potassium: 4.7 mEq/L (ref 3.5–5.1)
Sodium: 139 mEq/L (ref 135–145)
Total Bilirubin: 0.9 mg/dL (ref 0.2–1.2)
Total Protein: 7 g/dL (ref 6.0–8.3)

## 2019-01-02 LAB — PSA: PSA: 2.05 ng/mL (ref 0.10–4.00)

## 2019-01-02 LAB — TESTOSTERONE: Testosterone: 416.67 ng/dL (ref 300.00–890.00)

## 2019-01-02 LAB — HEMOGLOBIN A1C: Hgb A1c MFr Bld: 6.2 % (ref 4.6–6.5)

## 2019-01-03 ENCOUNTER — Telehealth: Payer: Self-pay

## 2019-01-03 NOTE — Telephone Encounter (Signed)
Noted  

## 2019-01-03 NOTE — Telephone Encounter (Signed)
PA form for tadalafil (CIALIS) 5 MG tablet has been completed and faxed back.

## 2019-01-03 NOTE — Telephone Encounter (Signed)
PA has been approved through 01/03/20.

## 2019-01-03 NOTE — Telephone Encounter (Signed)
Checked the status of PA. Additional info was needed. This has been submitted. Waiting for response.

## 2019-01-03 NOTE — Progress Notes (Unsigned)
Am ort

## 2019-01-04 DIAGNOSIS — J3089 Other allergic rhinitis: Secondary | ICD-10-CM | POA: Diagnosis not present

## 2019-01-04 DIAGNOSIS — J301 Allergic rhinitis due to pollen: Secondary | ICD-10-CM | POA: Diagnosis not present

## 2019-01-04 DIAGNOSIS — J3081 Allergic rhinitis due to animal (cat) (dog) hair and dander: Secondary | ICD-10-CM | POA: Diagnosis not present

## 2019-01-11 ENCOUNTER — Encounter: Payer: Self-pay | Admitting: Family Medicine

## 2019-01-11 ENCOUNTER — Telehealth: Payer: Self-pay | Admitting: Family Medicine

## 2019-01-11 DIAGNOSIS — J3089 Other allergic rhinitis: Secondary | ICD-10-CM | POA: Diagnosis not present

## 2019-01-11 DIAGNOSIS — J3081 Allergic rhinitis due to animal (cat) (dog) hair and dander: Secondary | ICD-10-CM | POA: Diagnosis not present

## 2019-01-11 DIAGNOSIS — J301 Allergic rhinitis due to pollen: Secondary | ICD-10-CM | POA: Diagnosis not present

## 2019-01-11 DIAGNOSIS — R3912 Poor urinary stream: Secondary | ICD-10-CM

## 2019-01-11 DIAGNOSIS — N401 Enlarged prostate with lower urinary tract symptoms: Secondary | ICD-10-CM

## 2019-01-11 NOTE — Telephone Encounter (Signed)
Copied from [place] [id number]. Topic: Quick Communication - Rx Refill/Question >> Jan 11, 2019  1:05 PM Rainey Pines A wrote: Medication: tadalafil (CIALIS) 5 MG tablet (Pharmacy stated that the insurance company is asking for a quantity limit prior authorization.)  Has the patient contacted their pharmacy? Yes (Agent: If no, request that the patient contact the pharmacy for the refill.) (Agent: If yes, when and what did the pharmacy advise?)Contact PCP  Preferred Pharmacy (with phone number or street name): [id number] - [place], [address] AT [address] [phone] (Phone) [phone] (Fax)    Agent: Please be advised that RX refills may take up to 3 business days. We ask that you follow-up with your pharmacy.

## 2019-01-14 MED ORDER — TADALAFIL 5 MG PO TABS: 5.0000 mg | ORAL_TABLET | Freq: Every day | ORAL | 0 refills | Status: DC

## 2019-01-15 NOTE — Telephone Encounter (Signed)
Spoke with the patients pharmacy and they stated that the patient does not have any insurance on file, he only gave them a discount card.   Spoke with the patient he stated that no PA is needed.

## 2019-01-17 ENCOUNTER — Ambulatory Visit (INDEPENDENT_AMBULATORY_CARE_PROVIDER_SITE_OTHER): Payer: BC Managed Care – PPO | Admitting: Orthopaedic Surgery

## 2019-01-17 ENCOUNTER — Encounter: Payer: Self-pay | Admitting: Orthopaedic Surgery

## 2019-01-17 ENCOUNTER — Other Ambulatory Visit: Payer: Self-pay

## 2019-01-17 VITALS — BP 120/68 | HR 56 | Ht 69.0 in | Wt 175.0 lb

## 2019-01-17 DIAGNOSIS — G8929 Other chronic pain: Secondary | ICD-10-CM

## 2019-01-17 DIAGNOSIS — M5441 Lumbago with sciatica, right side: Secondary | ICD-10-CM

## 2019-01-17 NOTE — Progress Notes (Signed)
Office Visit Note   Patient: Stanley Bennett           Date of Birth: [birth year]           MRN: [id number] Visit Date: 01/17/2019              Requested by: Lucretia Kern, DO [address] [place],  [address] PCP: Lucretia Kern, DO   Assessment & Plan: Visit Diagnoses:  1. Chronic right-sided low back pain with right-sided sciatica     Plan: Chronic low back pain associated with right lower extremity radiculopathy over several years.  Has tried acupuncture and massage without much relief.  Does have some radicular pain as far distally as the right knee.  Will order MRI scan of lumbar spine as she continues to have pain to the point of compromise despite several years of being very active with exercises medicines and the above treatments.  Follow-Up Instructions: No follow-ups on file.   Orders:  No orders of the defined types were placed in this encounter.  No orders of the defined types were placed in this encounter.     Procedures: No procedures performed   Clinical Data: No additional findings.   Subjective: Chief Complaint  Patient presents with   Lower Back - Pain  Patient presents today with right sided lower back pain that radiates down his right leg. He has some pain in his groin as well. Patient states that the pain has been present for a long time, but worsened in the last least 3-77months. No known injury. He enjoys walking and states that it helps his pain. The pain is worse at night with sitting. He has numbnes and tingling in both lower extremities, but the right side is worse. He takes Advil as needed. He saw his PCP last month and had x-rays of his hip. He also had lower back x-rays in January. I reviewed films of his lumbar spine in the PACS system.  There is no listhesis or curvature.  There may be some mild facet sclerosis at L4-5 and L5-S1 but the disc spaces are well-maintained.  I also reviewed films of his pelvis.  There is some  ectopic calcification at the very lateral aspect of both acetabulum which may or may not be of significance but the joint spaces are well-maintained.. Mr. Caraveo relates that he is having predominantly right-sided symptoms with numbness and tingling and pain as far distally as the right knee.  Very minimal symptoms on the left.  Most of his pain is in the buttocks.  Occasionally will have some referred pain across his groin into the mid medial thigh.  He has not noticed any weakness.  Has tried over-the-counter medicines. Over the past several years he has tried both massage techniques and acupuncture without much relief.  HPI  Review of Systems  Constitutional: Negative for fatigue.  HENT: Negative for ear pain.   Eyes: Negative for pain.  Respiratory: Negative for shortness of breath.   Cardiovascular: Negative for leg swelling.  Gastrointestinal: Negative for constipation and diarrhea.  Endocrine: Negative for cold intolerance and heat intolerance.  Genitourinary: Negative for difficulty urinating.  Musculoskeletal: Negative for joint swelling.  Skin: Negative for rash.  Allergic/Immunologic: Negative for food allergies.  Neurological: Negative for weakness.  Hematological: Does not bruise/bleed easily.  Psychiatric/Behavioral: Positive for sleep disturbance.     Objective: Vital Signs: BP 120/68    Pulse (!) 56    Ht 5\' 9"  (1.753 m)  Wt 175 lb (79.4 kg)    BMI 25.84 kg/m   Physical Exam Constitutional:      Appearance: He is well-developed.  Eyes:     Pupils: Pupils are equal, round, and reactive to light.  Pulmonary:     Effort: Pulmonary effort is normal.  Skin:    General: Skin is warm and dry.  Neurological:     Mental Status: He is alert and oriented to person, place, and time.  Psychiatric:        Behavior: Behavior normal.     Ortho Exam awake alert and oriented x3.  Comfortable sitting.  Straight leg raise negative bilaterally.  Reflexes symmetrical.   Motor and sensory exam intact.  No distal edema.  No knee pain.  No groin pain with internal or external rotation of either hip.  No loss of hip motion.  No percussible tenderness in the right buttock or the lateral aspect of the hip.  No back pain to percussion.  No discomfort over  either sacroiliac joint.  No obvious atrophy of one leg compared to the other  Specialty Comments:  No specialty comments available.  Imaging: No results found.   PMFS History: Patient Active Problem List   Diagnosis Date Noted   Gout 08/07/2016   H/O seasonal allergies 08/07/2016   Hyperlipidemia 08/07/2016   Chronic pain of both knees 08/07/2016   Erectile dysfunction 08/05/2016   Gastroesophageal reflux disease 08/05/2016   Past Medical History:  Diagnosis Date   Allergy    Basal cell carcinoma    scalp   Gout    Hyperlipidemia     Family History  Problem Relation Age of Onset   Arthritis Mother    Arthritis Father    Heart disease Father        (pneumonia cause of death 54 yrs)   Cancer Sister        ovarian   Diabetes Sister    Kidney failure Cousin        pat cousin   Other Brother        celiac sprue   Kidney failure Cousin        pat cousin   Colon cancer Neg Hx    Esophageal cancer Neg Hx    Rectal cancer Neg Hx     Past Surgical History:  Procedure Laterality Date   APPENDECTOMY     BASAL CELL CARCINOMA EXCISION     CERVICAL [place] SURGERY     TONSILLECTOMY     Social History   Occupational History   Occupation: Development worker, international aid  Tobacco Use   Smoking status: Never Smoker   Smokeless tobacco: Never Used  Substance and Sexual Activity   Alcohol use: Yes    Alcohol/week: 6.0 standard drinks    Types: 6 Cans of beer per week    Comment: 2 - 5 days per week   Drug use: No   Sexual activity: Yes    Partners: Female

## 2019-01-17 NOTE — Addendum Note (Signed)
Addended by: Lendon Collar on: 01/17/2019 02:43 PM   Modules accepted: Orders

## 2019-01-21 DIAGNOSIS — J3081 Allergic rhinitis due to animal (cat) (dog) hair and dander: Secondary | ICD-10-CM | POA: Diagnosis not present

## 2019-01-21 DIAGNOSIS — J301 Allergic rhinitis due to pollen: Secondary | ICD-10-CM | POA: Diagnosis not present

## 2019-01-21 DIAGNOSIS — J3089 Other allergic rhinitis: Secondary | ICD-10-CM | POA: Diagnosis not present

## 2019-02-01 DIAGNOSIS — J3089 Other allergic rhinitis: Secondary | ICD-10-CM | POA: Diagnosis not present

## 2019-02-01 DIAGNOSIS — J3081 Allergic rhinitis due to animal (cat) (dog) hair and dander: Secondary | ICD-10-CM | POA: Diagnosis not present

## 2019-02-01 DIAGNOSIS — J301 Allergic rhinitis due to pollen: Secondary | ICD-10-CM | POA: Diagnosis not present

## 2019-02-02 ENCOUNTER — Other Ambulatory Visit: Payer: Self-pay | Admitting: Family Medicine

## 2019-02-06 ENCOUNTER — Encounter: Payer: Self-pay | Admitting: Family Medicine

## 2019-02-06 DIAGNOSIS — J3089 Other allergic rhinitis: Secondary | ICD-10-CM | POA: Diagnosis not present

## 2019-02-06 DIAGNOSIS — N401 Enlarged prostate with lower urinary tract symptoms: Secondary | ICD-10-CM

## 2019-02-06 DIAGNOSIS — J301 Allergic rhinitis due to pollen: Secondary | ICD-10-CM | POA: Diagnosis not present

## 2019-02-06 DIAGNOSIS — J3081 Allergic rhinitis due to animal (cat) (dog) hair and dander: Secondary | ICD-10-CM | POA: Diagnosis not present

## 2019-02-06 NOTE — Telephone Encounter (Signed)
Journey with [facility] calling to check on the status of this request.

## 2019-02-07 MED ORDER — TADALAFIL 5 MG PO TABS: 5.0000 mg | ORAL_TABLET | Freq: Every day | ORAL | 0 refills | Status: DC

## 2019-02-08 ENCOUNTER — Other Ambulatory Visit: Payer: Self-pay

## 2019-02-08 ENCOUNTER — Ambulatory Visit
Admission: RE | Admit: 2019-02-08 | Discharge: 2019-02-08 | Disposition: A | Discharge status: Home / Self Care | Payer: BC Managed Care – PPO | Source: Ambulatory Visit | Attending: Orthopaedic Surgery | Admitting: Orthopaedic Surgery

## 2019-02-08 DIAGNOSIS — M545 Low back pain: Secondary | ICD-10-CM | POA: Diagnosis not present

## 2019-02-08 DIAGNOSIS — G8929 Other chronic pain: Secondary | ICD-10-CM

## 2019-02-12 ENCOUNTER — Other Ambulatory Visit: Payer: Self-pay

## 2019-02-12 ENCOUNTER — Ambulatory Visit: Payer: BC Managed Care – PPO | Admitting: Orthopaedic Surgery

## 2019-02-12 ENCOUNTER — Encounter: Payer: Self-pay | Admitting: Orthopaedic Surgery

## 2019-02-12 VITALS — BP 132/81 | HR 53 | Ht 69.0 in | Wt 175.0 lb

## 2019-02-12 DIAGNOSIS — G8929 Other chronic pain: Secondary | ICD-10-CM

## 2019-02-12 DIAGNOSIS — M5441 Lumbago with sciatica, right side: Secondary | ICD-10-CM

## 2019-02-12 DIAGNOSIS — J3081 Allergic rhinitis due to animal (cat) (dog) hair and dander: Secondary | ICD-10-CM | POA: Diagnosis not present

## 2019-02-12 DIAGNOSIS — J301 Allergic rhinitis due to pollen: Secondary | ICD-10-CM | POA: Diagnosis not present

## 2019-02-12 DIAGNOSIS — J3089 Other allergic rhinitis: Secondary | ICD-10-CM | POA: Diagnosis not present

## 2019-02-12 NOTE — Progress Notes (Signed)
Office Visit Note   Patient: Stanley Bennett           Date of Birth: [birth year]           MRN: [id number] Visit Date: 02/12/2019              Requested by: Lucretia Kern, DO [address] [place],  [place] [address] PCP: Caren Macadam, MD   Assessment & Plan: Visit Diagnoses:  1. Chronic right-sided low back pain with right-sided sciatica     Plan: MRI scan of lumbar spine did not demonstrate any disc problems or stenosis that would account for the numbness and tingling in his right lower extremity that is had on a chronic basis.  He did develop some numbness and tingling in both upper and lower extremities prior to his cervical spine surgery by Dr. Ellene Route in 2010.  This could be a residual.  We will try a course of physical therapy and have him return in 6 to 8 weeks.  Consider referral back to Dr. Ellene Route or EMGs and nerve conduction studies of both upper and lower extremities.  Mr. Shaneyfelt was fine with the above  Follow-Up Instructions: Return in about 2 months (around 04/14/2019).   Orders:  No orders of the defined types were placed in this encounter.  No orders of the defined types were placed in this encounter.     Procedures: No procedures performed   Clinical Data: No additional findings.   Subjective: Chief Complaint  Patient presents with  . Lower Back - Follow-up  Patient presents today for a one month follow up on his lower back. He had an MRI on 02/08/2019 and is here today for those results. Patient states that he has noticed no change since his last visit. He takes Advil for pain as needed, usually at night.  Mr. Kilpatrick is had a chronic problem with with the pain and numbness in his right leg.  He developed some numbness and tingling in both upper and lower extremities prior to his cervical spine surgery by Dr. Ellene Route in 2010.  He does work at a did upright desk and has to walk routinely to avoid pain.  He is not having any trouble on the  left side.  HPI  Review of Systems   Objective: Vital Signs: BP 132/81   Pulse (!) 53   Ht 5\' 9"  (1.753 m)   Wt 175 lb (79.4 kg)   BMI 25.84 kg/m   Physical Exam Constitutional:      Appearance: He is well-developed.  Eyes:     Pupils: Pupils are equal, round, and reactive to light.  Pulmonary:     Effort: Pulmonary effort is normal.  Skin:    General: Skin is warm and dry.  Neurological:     Mental Status: He is alert and oriented to person, place, and time.  Psychiatric:        Behavior: Behavior normal.     Ortho Exam awake alert and oriented x3.  Comfortable sitting.  Straight leg raise negative bilaterally.  Reflexes were depressed but equal bilaterally.  Good strength to both lower extremities.  Normal sensation.  Painless range of motion both hips.  No percussible tenderness of lumbar spine  Specialty Comments:  No specialty comments available.  Imaging: No results found.   PMFS History: Patient Active Problem List   Diagnosis Date Noted  . Chronic right-sided low back pain with right-sided sciatica 02/12/2019  . Gout 08/07/2016  .  H/O seasonal allergies 08/07/2016  . Hyperlipidemia 08/07/2016  . Chronic pain of both knees 08/07/2016  . Erectile dysfunction 08/05/2016  . Gastroesophageal reflux disease 08/05/2016   Past Medical History:  Diagnosis Date  . Allergy   . Basal cell carcinoma    scalp  . Gout   . Hyperlipidemia     Family History  Problem Relation Age of Onset  . Arthritis Mother   . Arthritis Father   . Heart disease Father        (pneumonia cause of death 53 yrs)  . Cancer Sister        ovarian  . Diabetes Sister   . Kidney failure Cousin        pat cousin  . Other Brother        celiac sprue  . Kidney failure Cousin        pat cousin  . Colon cancer Neg Hx   . Esophageal cancer Neg Hx   . Rectal cancer Neg Hx     Past Surgical History:  Procedure Laterality Date  . APPENDECTOMY    . BASAL CELL CARCINOMA EXCISION     . CERVICAL DISC SURGERY    . TONSILLECTOMY     Social History   Occupational History  . Occupation: Development worker, international aid  Tobacco Use  . Smoking status: Never Smoker  . Smokeless tobacco: Never Used  Substance and Sexual Activity  . Alcohol use: Yes    Alcohol/week: 6.0 standard drinks    Types: 6 Cans of beer per week    Comment: 2 - 5 days per week  . Drug use: No  . Sexual activity: Yes    Partners: Female

## 2019-02-12 NOTE — Addendum Note (Signed)
Addended by: Lendon Collar on: 02/12/2019 02:40 PM   Modules accepted: Orders

## 2019-02-13 ENCOUNTER — Encounter: Payer: Self-pay | Admitting: Physical Therapy

## 2019-02-13 ENCOUNTER — Ambulatory Visit: Payer: BC Managed Care – PPO | Attending: Orthopaedic Surgery | Admitting: Physical Therapy

## 2019-02-13 DIAGNOSIS — M5441 Lumbago with sciatica, right side: Secondary | ICD-10-CM | POA: Diagnosis not present

## 2019-02-13 DIAGNOSIS — R293 Abnormal posture: Secondary | ICD-10-CM | POA: Insufficient documentation

## 2019-02-13 DIAGNOSIS — M62830 Muscle spasm of back: Secondary | ICD-10-CM

## 2019-02-13 DIAGNOSIS — G8929 Other chronic pain: Secondary | ICD-10-CM

## 2019-02-13 DIAGNOSIS — M6281 Muscle weakness (generalized): Secondary | ICD-10-CM | POA: Insufficient documentation

## 2019-02-13 NOTE — Patient Instructions (Signed)
Access Code: [email or web address]  URL: [email or web address]  Date: 02/13/2019  Prepared by: Earlie Counts   Exercises Supine Figure 4 Piriformis Stretch - 2 reps - 1 sets - 30 sec hold - 1x daily - 7x weekly Supine Single Knee to Chest Stretch - 2 reps - 1 sets - 30 sec hold                            - 1x daily - 7x weekly Half Kneeling Hip Flexor Stretch with Tri-Planar Reach - 2 reps - 1 sets - 30 sec hold - 1x daily - 7x weekly Standing 'L' Stretch at [employer] - 1 reps - 1 sets - 30 sec hold - 1x daily - 7x weekly Patient Education Trigger [facility] [address], [place], [place] [address] Phone # [phone] Fax [phone]

## 2019-02-13 NOTE — Therapy (Signed)
[facility] [address] [place], [place], [address] Phone: [phone]   Fax:  [phone]  Physical Therapy Evaluation  Patient Details  Name: Stanley Bennett MRN: [id number] Date of Birth: [birth year] Referring Provider (PT): Dr. Joni Fears   Encounter Date: 02/13/2019  PT End of Session - 02/13/19 0848    Visit Number  1    Date for PT Re-Evaluation  04/10/19    Authorization Type  [employer]    PT Start Time  0800    PT Stop Time  0845    PT Time Calculation (min)  45 min    Activity Tolerance  Patient tolerated treatment well;No increased pain    Behavior During Therapy  WFL for tasks assessed/performed       Past Medical History:  Diagnosis Date  . Allergy   . Basal cell carcinoma    scalp  . Gout   . Hyperlipidemia     Past Surgical History:  Procedure Laterality Date  . APPENDECTOMY    . BASAL CELL CARCINOMA EXCISION    . CERVICAL DISC SURGERY    . TONSILLECTOMY      There were no vitals filed for this visit.   Subjective Assessment - 02/13/19 0802    Subjective  Patient reports chronic back pain. Patient gave it up when he had neck surgery. Will limp first few steps in the morning until it loosens up.    Patient Stated Goals  reduce pain    Currently in Pain?  Yes    Pain Score  8    low 2/10   Pain Location  Back    Pain Orientation  Right    Pain Descriptors / Indicators  Dull;Sharp    Pain Type  Chronic pain    Pain Radiating Towards  radiate into the right hip, groin and knee    Pain Onset  More than a month ago    Pain Frequency  Constant    Aggravating Factors   stop walking, sitting,    Pain Relieving Factors  while walking         [facility] PT Assessment - 02/13/19 0001      Assessment   Medical Diagnosis  M54.41, G89.29 Chronic right-sided low back pain with right-sided sciatica    Referring Provider (PT)  Dr. Joni Fears    Onset Date/Surgical Date  --   chronic    Prior Therapy  none      Precautions   Precautions  None      Restrictions   Weight Bearing Restrictions  No      Balance Screen   Has the patient fallen in the past 6 months  No    Has the patient had a decrease in activity level because of a fear of falling?   No    Is the patient reluctant to leave their home because of a fear of falling?   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    Leisure  walk 7-8 miles per day; golf      Cognition   Overall Cognitive Status  Within Functional Limits for tasks assessed      Observation/Other Assessments   Focus on Therapeutic Outcomes (FOTO)   37%  limitation; goal is 27% limitation      Posture/Postural Control   Posture/Postural Control  Postural limitations    Postural Limitations  Rounded Shoulders;Forward head;Decreased lumbar lordosis      ROM / Strength   AROM / PROM / Strength  AROM;PROM;Strength      AROM   Lumbar Flexion  decreased by 25% with tightness in low lumbar fascia    Lumbar Extension  full    Lumbar - Right Rotation  decreased by 25%    Lumbar - Left Rotation  decreased by 25%      PROM   Right Hip External Rotation   45    Left Hip External Rotation   40      Strength   Right Hip Extension  4/5    Right Hip ADduction  4/5    Left Hip Extension  5/5    Left Hip ABduction  4/5    Left Hip ADduction  4/5    Right Knee Flexion  3+/5      Palpation   Spinal mobility  T5-T10 decreased mobility, L3-S1 decreased mobility    SI assessment   right ilium rotated anteriorly                Objective measurements completed on examination: See above findings.      [facility] Adult PT Treatment/Exercise - 02/13/19 0001      Lumbar Exercises: Stretches   Hip Flexor Stretch  Right;Left;1 rep;30 seconds   1/2 knee position   Prone Mid  Back Stretch  1 rep;30 seconds    Prone Mid Back Stretch Limitations  tried but increased left knee pain    Piriformis Stretch  Right;Left;1 rep;30 seconds   supine   Other Lumbar Stretch Exercise  standing leaning on mat to stretch low back      Manual Therapy   Manual Therapy  Soft tissue mobilization;Joint mobilization;Muscle Energy Technique    Joint Mobilization  gapping of the right side of L1-S1 with elongation    Soft tissue mobilization  elongate lumbar paraspinals    Muscle Energy Technique  to correct anterior rotated right ilium       Trigger Point Dry Needling - 02/13/19 0001    Consent Given?  Yes    Education Handout Provided  Yes    Muscles Treated Back/Hip  Lumbar multifidi    Lumbar multifidi Response  Twitch response elicited;Palpable increased muscle length           PT Education - 02/13/19 0848    Education Details  Access Code: [email or web address]    Person(s) Educated  Patient    Methods  Explanation;Demonstration;Handout    Comprehension  Returned demonstration;Verbalized understanding       PT Short Term Goals - 02/13/19 0856      PT SHORT TERM GOAL #1   Title  independent with initial HEP    Time  4    Period  Weeks    Status  New    Target Date  03/13/19      PT SHORT TERM GOAL #2   Title  low back pain into right leg is intermittent instead of constant    Time  4    Period  Weeks    Status  New    Target Date  03/13/19      PT SHORT TERM GOAL #3   Title  when sit for a period of time will limp 25% less    Time  4  Period  Weeks    Status  New    Target Date  03/13/19        PT Long Term Goals - 02/13/19 0857      PT LONG TERM GOAL #1   Title  The patient will be indep with HEP and understand how to progress himself    Baseline  --    Time  8    Period  Weeks    Status  New    Target Date  04/10/19      PT LONG TERM GOAL #2   Title  able to sit for 30 minutes and get up to walk without a limp due to improved mobility and  decreased pain    Baseline  -    Time  8    Period  Weeks    Status  New    Target Date  04/10/19      PT LONG TERM GOAL #3   Title  Pain after walking decreased >/= 50% due to improved mobility    Baseline  -    Time  8    Period  Weeks    Status  New    Target Date  04/10/19      PT LONG TERM GOAL #4   Title  after waking up in the morning pain decreased >/= 50% with less of a limp on the right    Time  8    Period  Weeks    Status  New    Target Date  04/10/19      PT LONG TERM GOAL #5   Title  FOTO score </= 27% limitation    Time  8    Period  Weeks    Status  New    Target Date  04/10/19             Plan - 02/13/19 0849    Clinical Impression Statement  Patient is a 63 year old male with chronic low back pain that will radiste down to the right groin to the right knee. Patient reports constant pain that varies from 2-7/10. Patient reports the pain is worse after walking and after sitting for a long period of time. Patient will limp on the right after sitting for a long period of time. Patient has a rotated right ilium, decreased mobility of T5-T8 and L1-S1. Patient has weakness in bilateral hips. Patient posture consitis of reduced lumbar lordosis, forward head and rounded shoulders. Patient will benefit from skilled therapy to improve mobility and strength while reducing pain.    Personal Factors and Comorbidities  Time since onset of injury/illness/exacerbation    Examination-Activity Limitations  Locomotion Level;Bend;Stand    Stability/Clinical Decision Making  Evolving/Moderate complexity    Clinical Decision Making  Moderate    Rehab Potential  Excellent    PT Frequency  2x / week    PT Duration  8 weeks    PT Treatment/Interventions  ADLs/Self Care Home Management;Cryotherapy;Electrical Stimulation;Therapeutic activities;Therapeutic exercise;Neuromuscular re-education;Patient/family education;Manual techniques;Passive range of motion;Dry needling;Spinal  Manipulations;Joint Manipulations    PT Next Visit Plan  See how dry needling did to lumbar, right hip mobilization to work on rotation, pelvic alignment, core strength    PT Home Exercise Plan  Access Code: [email or web address]    Consulted and Agree with Plan of Care  Patient       Patient will benefit from skilled therapeutic intervention in order to improve the following deficits and impairments:  Abnormal gait,  Decreased range of motion, Increased fascial restricitons, Increased muscle spasms, Decreased activity tolerance, Pain, Decreased mobility, Decreased strength, Postural dysfunction  Visit Diagnosis: 1. Chronic right-sided low back pain with right-sided sciatica   2. Muscle weakness (generalized)   3. Muscle spasm of back   4. Abnormal posture        Problem List Patient Active Problem List   Diagnosis Date Noted  . Chronic right-sided low back pain with right-sided sciatica 02/12/2019  . Gout 08/07/2016  . H/O seasonal allergies 08/07/2016  . Hyperlipidemia 08/07/2016  . Chronic pain of both knees 08/07/2016  . Erectile dysfunction 08/05/2016  . Gastroesophageal reflux disease 08/05/2016    Earlie Counts, PT 02/13/19 9:02 AM   [facility] [address] [place], [place], [address] Phone: [phone]   Fax:  [phone]  Name: RACHAEL ZAPANTA MRN: [id number] Date of Birth: 13-Mar-1956

## 2019-02-18 DIAGNOSIS — J3081 Allergic rhinitis due to animal (cat) (dog) hair and dander: Secondary | ICD-10-CM | POA: Diagnosis not present

## 2019-02-18 DIAGNOSIS — J3089 Other allergic rhinitis: Secondary | ICD-10-CM | POA: Diagnosis not present

## 2019-02-18 DIAGNOSIS — J301 Allergic rhinitis due to pollen: Secondary | ICD-10-CM | POA: Diagnosis not present

## 2019-02-19 ENCOUNTER — Ambulatory Visit: Payer: BC Managed Care – PPO | Admitting: Physical Therapy

## 2019-02-19 ENCOUNTER — Other Ambulatory Visit: Payer: Self-pay

## 2019-02-19 ENCOUNTER — Encounter: Payer: Self-pay | Admitting: Physical Therapy

## 2019-02-19 DIAGNOSIS — M6281 Muscle weakness (generalized): Secondary | ICD-10-CM | POA: Diagnosis not present

## 2019-02-19 DIAGNOSIS — M62830 Muscle spasm of back: Secondary | ICD-10-CM | POA: Diagnosis not present

## 2019-02-19 DIAGNOSIS — R293 Abnormal posture: Secondary | ICD-10-CM | POA: Diagnosis not present

## 2019-02-19 DIAGNOSIS — G8929 Other chronic pain: Secondary | ICD-10-CM | POA: Diagnosis not present

## 2019-02-19 DIAGNOSIS — M5441 Lumbago with sciatica, right side: Secondary | ICD-10-CM | POA: Diagnosis not present

## 2019-02-19 NOTE — Therapy (Signed)
[facility] [address] [place], [place], [address] Phone: [phone]   Fax:  [phone]  Physical Therapy Treatment  Patient Details  Name: Stanley Bennett MRN: [id number] Date of Birth: 12-Jan-1956 Referring Provider (PT): Dr. Joni Fears   Encounter Date: 02/19/2019  PT End of Session - 02/19/19 1146    Visit Number  2    Date for PT Re-Evaluation  04/10/19    Authorization Type  BCBS    PT Start Time  1100    PT Stop Time  1145    PT Time Calculation (min)  45 min    Activity Tolerance  Patient tolerated treatment well;No increased pain    Behavior During Therapy  WFL for tasks assessed/performed       Past Medical History:  Diagnosis Date  . Allergy   . Basal cell carcinoma    scalp  . Gout   . Hyperlipidemia     Past Surgical History:  Procedure Laterality Date  . APPENDECTOMY    . BASAL CELL CARCINOMA EXCISION    . CERVICAL DISC SURGERY    . TONSILLECTOMY      There were no vitals filed for this visit.  Subjective Assessment - 02/19/19 1102    Subjective  I was sore after last visit and still have alot of pain. Right side feels stiff even doing his stretching.    Patient Stated Goals  reduce pain    Currently in Pain?  Yes    Pain Score  3    night time is 7/10   Pain Location  Back    Pain Orientation  Right    Pain Descriptors / Indicators  Sharp;Dull    Pain Type  Chronic pain    Pain Onset  More than a month ago    Pain Frequency  Constant    Aggravating Factors   when going to bed, sitting, stop walking    Pain Relieving Factors  while walking                       [facility] Adult PT Treatment/Exercise - 02/19/19 0001      Posture/Postural Control   Posture Comments  measured hieght and is 70 inches, patient reports he lost the 1/2 inch and today gained it back      Lumbar Exercises: Aerobic   Elliptical  level 1 for 5 min      Manual Therapy   Manual  Therapy  Soft tissue mobilization;Joint mobilization    Joint Mobilization  anterior and lateral right hip mobilization grade 3; P-A and rotational mobilization to T10-L5 grade 3; lower right rib cage mobilizaiton    Soft tissue mobilization  slongate the lumbar paraspinals, gluteals, quadratus, iliopsoas, aroung righ tgreater trochanter, between rib 10-12 on right, obliques, and right diaphgram       Trigger Point Dry Needling - 02/19/19 0001    Consent Given?  Yes    Muscles Treated Back/Hip  Lumbar multifidi;Quadratus lumborum    Lumbar multifidi Response  Twitch response elicited;Palpable increased muscle length    Quadratus Lumborum Response  Twitch response elicited;Palpable increased muscle length             PT Short Term Goals - 02/13/19 0856      PT SHORT TERM GOAL #1   Title  independent with initial HEP    Time  4    Period  Weeks    Status  New    Target Date  03/13/19      PT SHORT TERM GOAL #2   Title  low back pain into right leg is intermittent instead of constant    Time  4    Period  Weeks    Status  New    Target Date  03/13/19      PT SHORT TERM GOAL #3   Title  when sit for a period of time will limp 25% less    Time  4    Period  Weeks    Status  New    Target Date  03/13/19        PT Long Term Goals - 02/13/19 0857      PT LONG TERM GOAL #1   Title  The patient will be indep with HEP and understand how to progress himself    Baseline  --    Time  8    Period  Weeks    Status  New    Target Date  04/10/19      PT LONG TERM GOAL #2   Title  able to sit for 30 minutes and get up to walk without a limp due to improved mobility and decreased pain    Baseline  -    Time  8    Period  Weeks    Status  New    Target Date  04/10/19      PT LONG TERM GOAL #3   Title  Pain after walking decreased >/= 50% due to improved mobility    Baseline  -    Time  8    Period  Weeks    Status  New    Target Date  04/10/19      PT LONG TERM GOAL  #4   Title  after waking up in the morning pain decreased >/= 50% with less of a limp on the right    Time  8    Period  Weeks    Status  New    Target Date  04/10/19      PT LONG TERM GOAL #5   Title  FOTO score </= 27% limitation    Time  8    Period  Weeks    Status  New    Target Date  04/10/19            Plan - 02/19/19 1147    Clinical Impression Statement  Patient has gained 1/2 inch after treatement. Patient had trigger points in the right gluteal. lumbar multifidi, quadratus. Patient had increased right hip ER and elongation of right hip flexor after treatment. Patient has not met goals yet due to just starting therapy. Patent will benefit from skilled therapy to improve posture, mobility and strength while reduceing pain.    Personal Factors and Comorbidities  Time since onset of injury/illness/exacerbation    Examination-Activity Limitations  Locomotion Level;Bend;Stand    Stability/Clinical Decision Making  Evolving/Moderate complexity    PT Frequency  2x / week    PT Duration  8 weeks    PT Treatment/Interventions  ADLs/Self Care Home Management;Cryotherapy;Electrical Stimulation;Therapeutic activities;Therapeutic exercise;Neuromuscular re-education;Patient/family education;Manual techniques;Passive range of motion;Dry needling;Spinal Manipulations;Joint Manipulations    PT Next Visit Plan  dry needling to lumbar, gluteals and right quadratus, check pelvic alignment, core strength, quadruped lift extremity    PT Home Exercise Plan  Access Code: [email or web address]    Recommended Other Services  MD signed initial eval  Consulted and Agree with Plan of Care  Patient       Patient will benefit from skilled therapeutic intervention in order to improve the following deficits and impairments:  Abnormal gait, Decreased range of motion, Increased fascial restricitons, Increased muscle spasms, Decreased activity tolerance, Pain, Decreased mobility, Decreased strength, Postural  dysfunction  Visit Diagnosis: 1. Chronic right-sided low back pain with right-sided sciatica   2. Muscle weakness (generalized)   3. Muscle spasm of back   4. Abnormal posture        Problem List Patient Active Problem List   Diagnosis Date Noted  . Chronic right-sided low back pain with right-sided sciatica 02/12/2019  . Gout 08/07/2016  . H/O seasonal allergies 08/07/2016  . Hyperlipidemia 08/07/2016  . Chronic pain of both knees 08/07/2016  . Erectile dysfunction 08/05/2016  . Gastroesophageal reflux disease 08/05/2016    Earlie Counts, PT 02/19/19 11:51 AM   [facility] [address] [place], [place], [address] Phone: [phone]   Fax:  [phone]  Name: Stanley Bennett MRN: [id number] Date of Birth: 22-Sep-1955

## 2019-03-04 ENCOUNTER — Other Ambulatory Visit: Payer: Self-pay

## 2019-03-04 ENCOUNTER — Encounter: Payer: Self-pay | Admitting: Physical Therapy

## 2019-03-04 ENCOUNTER — Ambulatory Visit: Payer: BC Managed Care – PPO | Admitting: Physical Therapy

## 2019-03-04 DIAGNOSIS — J3089 Other allergic rhinitis: Secondary | ICD-10-CM | POA: Diagnosis not present

## 2019-03-04 DIAGNOSIS — G8929 Other chronic pain: Secondary | ICD-10-CM

## 2019-03-04 DIAGNOSIS — J3081 Allergic rhinitis due to animal (cat) (dog) hair and dander: Secondary | ICD-10-CM | POA: Diagnosis not present

## 2019-03-04 DIAGNOSIS — J301 Allergic rhinitis due to pollen: Secondary | ICD-10-CM | POA: Diagnosis not present

## 2019-03-04 DIAGNOSIS — R293 Abnormal posture: Secondary | ICD-10-CM | POA: Diagnosis not present

## 2019-03-04 DIAGNOSIS — M5441 Lumbago with sciatica, right side: Secondary | ICD-10-CM | POA: Diagnosis not present

## 2019-03-04 DIAGNOSIS — M6281 Muscle weakness (generalized): Secondary | ICD-10-CM | POA: Diagnosis not present

## 2019-03-04 DIAGNOSIS — M62830 Muscle spasm of back: Secondary | ICD-10-CM

## 2019-03-04 NOTE — Therapy (Signed)
[facility] [address], [place], [place], [address] Phone: [phone]   Fax:  [phone]  Physical Therapy Treatment  Patient Details  Name: Stanley Bennett MRN: [id number] Date of Birth: [birth year] Referring Provider (PT): Dr. Joni Fears   Encounter Date: 03/04/2019  PT End of Session - 03/04/19 1351    Visit Number  3    Date for PT Re-Evaluation  04/10/19    Authorization Type  BCBS    PT Start Time  1300    PT Stop Time  1345    PT Time Calculation (min)  45 min    Activity Tolerance  Patient tolerated treatment well;No increased pain    Behavior During Therapy  WFL for tasks assessed/performed       Past Medical History:  Diagnosis Date  . Allergy   . Basal cell carcinoma    scalp  . Gout   . Hyperlipidemia     Past Surgical History:  Procedure Laterality Date  . APPENDECTOMY    . BASAL CELL CARCINOMA EXCISION    . CERVICAL DISC SURGERY    . TONSILLECTOMY      There were no vitals filed for this visit.  Subjective Assessment - 03/04/19 1304    Subjective  I feel flexible but no change in pain.    Patient Stated Goals  reduce pain    Pain Score  3     Pain Location  Back    Pain Orientation  Right    Pain Descriptors / Indicators  Sharp;Dull    Pain Type  Chronic pain    Pain Onset  More than a month ago    Pain Frequency  Constant    Aggravating Factors   when going to bed, sitting, stop walking    Pain Relieving Factors  while walking    Multiple Pain Sites  No                       [facility] Adult PT Treatment/Exercise - 03/04/19 0001      Lumbar Exercises: Stretches   Passive Hamstring Stretch  Right;Left;1 rep;30 seconds   supine with strap   Hip Flexor Stretch  Right;2 reps   prone with strap   Piriformis Stretch  Right;Left;1 rep;30 seconds   supine     Lumbar Exercises: Aerobic   Elliptical  level 1 for 5 min      Lumbar Exercises: Standing   Lifting  From 12";5 reps   5x with 10#, 10x 20#   Lifting Limitations  with VC to push through with his heels, instability in midrange      Lumbar Exercises: Quadruped   Opposite Arm/Leg Raise  Right arm/Left leg;Left arm/Right leg;10 reps   VC to keep stady     Modalities   Modalities  Electrical engineer Stimulation Location  lumbar with dry needling the multifidi    Electrical Stimulation Action  90 milliamps, to patient tolerance    Electrical Stimulation Parameters  8 minutes    Electrical Stimulation Goals  Strength;Tone      Manual Therapy   Manual Therapy  Soft tissue mobilization    Soft tissue mobilization  elongate the right gluteus medius and right lumbar paraspinals using an assistive device       Trigger Point Dry Needling - 03/04/19 0001    Consent Given?  Yes    Muscles Treated  Back/Hip  Lumbar multifidi;Gluteus medius    Gluteus Medius Response  Twitch response elicited;Palpable increased muscle length   right   Lumbar multifidi Response  Twitch response elicited;Palpable increased muscle length   right L2-L5, left L5          PT Education - 03/04/19 1320    Education Details  Access Code: [email or web address]    Person(s) Educated  Patient    Methods  Explanation;Demonstration;Verbal cues;Handout    Comprehension  Returned demonstration;Verbalized understanding       PT Short Term Goals - 03/04/19 1356      PT SHORT TERM GOAL #1   Title  independent with initial HEP    Time  4    Period  Weeks    Status  Achieved      PT SHORT TERM GOAL #2   Title  low back pain into right leg is intermittent instead of constant    Time  4    Period  Weeks    Status  On-going    Target Date  03/13/19      PT SHORT TERM GOAL #3   Title  when sit for a period of time will limp 25% less    Time  4    Period  Weeks    Status  On-going    Target Date  03/13/19        PT Long Term Goals - 02/13/19 0857      PT LONG  TERM GOAL #1   Title  The patient will be indep with HEP and understand how to progress himself    Baseline  --    Time  8    Period  Weeks    Status  New    Target Date  04/10/19      PT LONG TERM GOAL #2   Title  able to sit for 30 minutes and get up to walk without a limp due to improved mobility and decreased pain    Baseline  -    Time  8    Period  Weeks    Status  New    Target Date  04/10/19      PT LONG TERM GOAL #3   Title  Pain after walking decreased >/= 50% due to improved mobility    Baseline  -    Time  8    Period  Weeks    Status  New    Target Date  04/10/19      PT LONG TERM GOAL #4   Title  after waking up in the morning pain decreased >/= 50% with less of a limp on the right    Time  8    Period  Weeks    Status  New    Target Date  04/10/19      PT LONG TERM GOAL #5   Title  FOTO score </= 27% limitation    Time  8    Period  Weeks    Status  New    Target Date  04/10/19            Plan - 03/04/19 1310    Clinical Impression Statement  Patient reports he is standing straighter and more flexible but pain is the same. The pelvis in correct alignment. Patient had trigger points in the right gluteus medius and right multifidi. Patient has increased instability in lumbar in midline with lifting. Patient will benefit from skilled therapy to improve posture,  mobility and strength while reducing pain.    Personal Factors and Comorbidities  Time since onset of injury/illness/exacerbation    Examination-Activity Limitations  Locomotion Level;Bend;Stand    Stability/Clinical Decision Making  Evolving/Moderate complexity    Rehab Potential  Excellent    PT Frequency  2x / week    PT Treatment/Interventions  ADLs/Self Care Home Management;Cryotherapy;Electrical Stimulation;Therapeutic activities;Therapeutic exercise;Neuromuscular re-education;Patient/family education;Manual techniques;Passive range of motion;Dry needling;Spinal Manipulations;Joint  Manipulations    PT Next Visit Plan  core strength with lifting, quadruped lift extremity    PT Home Exercise Plan  Access Code: [email or web address]    Consulted and Agree with Plan of Care  Patient       Patient will benefit from skilled therapeutic intervention in order to improve the following deficits and impairments:  Abnormal gait, Decreased range of motion, Increased fascial restricitons, Increased muscle spasms, Decreased activity tolerance, Pain, Decreased mobility, Decreased strength, Postural dysfunction  Visit Diagnosis: 1. Chronic right-sided low back pain with right-sided sciatica   2. Muscle weakness (generalized)   3. Muscle spasm of back   4. Abnormal posture        Problem List Patient Active Problem List   Diagnosis Date Noted  . Chronic right-sided low back pain with right-sided sciatica 02/12/2019  . Gout 08/07/2016  . H/O seasonal allergies 08/07/2016  . Hyperlipidemia 08/07/2016  . Chronic pain of both knees 08/07/2016  . Erectile dysfunction 08/05/2016  . Gastroesophageal reflux disease 08/05/2016    Earlie Counts, PT 03/04/19 1:58 PM   [facility] [address], [place], [place], [address] Phone: [phone]   Fax:  [phone]  Name: Stanley Bennett MRN: [id number] Date of Birth: 09-11-1955

## 2019-03-04 NOTE — Patient Instructions (Signed)
Access Code: [email or web address]  URL: [email or web address]  Date: 03/04/2019  Prepared by: Earlie Counts   Exercises Supine Figure 4 Piriformis Stretch - 2 reps - 1 sets - 30 sec hold - 1x daily - 7x weekly Supine Single Knee to Chest Stretch - 2 reps - 1 sets - 30 sec hold                            - 1x daily - 7x weekly Half Kneeling Hip Flexor Stretch with Tri-Planar Reach - 2 reps - 1 sets - 30 sec hold - 1x daily - 7x weekly Standing 'L' Stretch at Counter - 1 reps - 1 sets - 30 sec hold - 1x daily - 7x weekly Prone Hip Flexor Stretch on Table with Strap - 2 reps - 1 sets - 30 sec hold - 1x daily - 7x weekly Supine Hamstring Stretch with Strap - 2 reps - 1 sets - 30 sec hold - 1x daily - 7x weekly Supine Piriformis Stretch with Residual Limb (AKA) - 2 reps - 1 sets - 30 sec hold - 1x daily - 7x weekly Bird Dog - 10 reps - 2 sets - 1x daily - 7x weekly Patient Education Trigger [facility] Dry Needling [facility] [address] [place], [place] [address] Phone # [phone] Fax [phone]

## 2019-03-07 ENCOUNTER — Ambulatory Visit: Payer: BC Managed Care – PPO | Admitting: Physical Therapy

## 2019-03-07 ENCOUNTER — Other Ambulatory Visit: Payer: Self-pay

## 2019-03-07 ENCOUNTER — Encounter: Payer: Self-pay | Admitting: Physical Therapy

## 2019-03-07 DIAGNOSIS — R293 Abnormal posture: Secondary | ICD-10-CM

## 2019-03-07 DIAGNOSIS — G8929 Other chronic pain: Secondary | ICD-10-CM

## 2019-03-07 DIAGNOSIS — M62830 Muscle spasm of back: Secondary | ICD-10-CM

## 2019-03-07 DIAGNOSIS — M6281 Muscle weakness (generalized): Secondary | ICD-10-CM | POA: Diagnosis not present

## 2019-03-07 DIAGNOSIS — M5441 Lumbago with sciatica, right side: Secondary | ICD-10-CM | POA: Diagnosis not present

## 2019-03-07 NOTE — Therapy (Signed)
[facility] [address] [place], [place], [address] Phone: [phone]   Fax:  [phone]  Physical Therapy Treatment  Patient Details  Name: Stanley Bennett MRN: [id number] Date of Birth: Nov 02, 1955 Referring Provider (PT): Dr. Joni Fears   Encounter Date: 03/07/2019  PT End of Session - 03/07/19 1049    Visit Number  4    Date for PT Re-Evaluation  04/10/19    Authorization Type  BCBS    PT Start Time  1000    PT Stop Time  1040    PT Time Calculation (min)  40 min    Activity Tolerance  Patient tolerated treatment well;No increased pain    Behavior During Therapy  WFL for tasks assessed/performed       Past Medical History:  Diagnosis Date  . Allergy   . Basal cell carcinoma    scalp  . Gout   . Hyperlipidemia     Past Surgical History:  Procedure Laterality Date  . APPENDECTOMY    . BASAL CELL CARCINOMA EXCISION    . CERVICAL DISC SURGERY    . TONSILLECTOMY      There were no vitals filed for this visit.  Subjective Assessment - 03/07/19 1002    Subjective  I have improved movement. I had not had to take ibuprofen. I played 18 holes of golf without stiffness in my back. Pain does not keep patient awake anymore. My pain is dull and no more sharpness.    Patient Stated Goals  reduce pain    Currently in Pain?  Yes    Pain Score  3     Pain Location  Back    Pain Orientation  Right    Pain Descriptors / Indicators  Dull    Pain Type  Chronic pain    Pain Radiating Towards  radiate into the right hip, groin and knee    Pain Onset  More than a month ago    Pain Frequency  Intermittent    Aggravating Factors   when going to bed, sitting, stop walking    Pain Relieving Factors  while walking    Multiple Pain Sites  No                       [facility] Adult PT Treatment/Exercise - 03/07/19 0001      Lumbar Exercises: Aerobic   Elliptical  level 3 for 6 min      Lumbar  Exercises: Standing   Lifting  From 12";5 reps   5x with 10#, 10x 20#   Lifting Limitations  with VC to push through with his heels, instability in midrange    Forward Lunge  5 reps;1 second   each side   Forward Lunge Limitations  VC to engage the core, keep pelvs leveled    Other Standing Lumbar Exercises  lunge position with pulling the green band across body with VC to contract the abdominals    Other Standing Lumbar Exercises  tall kneel rolling the red physioball forward engaging the abdominals      Lumbar Exercises: Supine   Bridge  15 reps    Bridge Limitations  feet on red physioball    Other Supine Lumbar Exercises  supine with feet on red physioball with rotation back and forth      Shoulder Exercises: Standing   Extension  Strengthening;Both;10 reps   green band     Manual Therapy  Manual Therapy  Soft tissue mobilization    Soft tissue mobilization  elongate bilateral lumbar paraspinals, and right gluteal and around the right greater trochanter               PT Short Term Goals - 03/07/19 1053      PT SHORT TERM GOAL #2   Title  low back pain into right leg is intermittent instead of constant    Time  4    Period  Weeks    Status  Achieved    Target Date  03/13/19      PT SHORT TERM GOAL #3   Title  when sit for a period of time will limp 25% less    Time  4    Period  Weeks    Status  On-going    Target Date  03/13/19        PT Long Term Goals - 02/13/19 0857      PT LONG TERM GOAL #1   Title  The patient will be indep with HEP and understand how to progress himself    Baseline  --    Time  8    Period  Weeks    Status  New    Target Date  04/10/19      PT LONG TERM GOAL #2   Title  able to sit for 30 minutes and get up to walk without a limp due to improved mobility and decreased pain    Baseline  -    Time  8    Period  Weeks    Status  New    Target Date  04/10/19      PT LONG TERM GOAL #3   Title  Pain after walking decreased  >/= 50% due to improved mobility    Baseline  -    Time  8    Period  Weeks    Status  New    Target Date  04/10/19      PT LONG TERM GOAL #4   Title  after waking up in the morning pain decreased >/= 50% with less of a limp on the right    Time  8    Period  Weeks    Status  New    Target Date  04/10/19      PT LONG TERM GOAL #5   Title  FOTO score </= 27% limitation    Time  8    Period  Weeks    Status  New    Target Date  04/10/19            Plan - 03/07/19 1005    Clinical Impression Statement  Patient has increased mobility. Patient does not have the sharp pain just dull. Patient pain is intermittient with constant tightness. Patient was able to play golf without pain. Patient is not taking Ibuprofen anymore. Patient is working on core strength. Patient still has difficulty with squatting with weight due to decreased core strength. Patient will benefit from skilled therapy to improve posture, mobility, and strength while reduce pain.    Personal Factors and Comorbidities  Time since onset of injury/illness/exacerbation    Examination-Activity Limitations  Locomotion Level;Bend;Stand    Stability/Clinical Decision Making  Evolving/Moderate complexity    Rehab Potential  Excellent    PT Frequency  2x / week    PT Duration  8 weeks    PT Treatment/Interventions  ADLs/Self Care Home Management;Cryotherapy;Electrical Stimulation;Therapeutic activities;Therapeutic exercise;Neuromuscular re-education;Patient/family  education;Manual techniques;Passive range of motion;Dry needling;Spinal Manipulations;Joint Manipulations    PT Next Visit Plan  core strength with lifting, soft tissue work to the lumbar paraspinals    PT Home Exercise Plan  Access Code: [email or web address]    Consulted and Agree with Plan of Care  Patient       Patient will benefit from skilled therapeutic intervention in order to improve the following deficits and impairments:  Abnormal gait, Decreased range of motion,  Increased fascial restricitons, Increased muscle spasms, Decreased activity tolerance, Pain, Decreased mobility, Decreased strength, Postural dysfunction  Visit Diagnosis: 1. Chronic right-sided low back pain with right-sided sciatica   2. Muscle weakness (generalized)   3. Muscle spasm of back   4. Abnormal posture        Problem List Patient Active Problem List   Diagnosis Date Noted  . Chronic right-sided low back pain with right-sided sciatica 02/12/2019  . Gout 08/07/2016  . H/O seasonal allergies 08/07/2016  . Hyperlipidemia 08/07/2016  . Chronic pain of both knees 08/07/2016  . Erectile dysfunction 08/05/2016  . Gastroesophageal reflux disease 08/05/2016    Earlie Counts, PT 03/07/19 10:54 AM    [facility] [address] [place], [place], [address] Phone: [phone]   Fax:  [phone]  Name: Stanley Bennett MRN: [id number] Date of Birth: [birth year]

## 2019-03-11 ENCOUNTER — Other Ambulatory Visit: Payer: Self-pay

## 2019-03-11 ENCOUNTER — Ambulatory Visit: Payer: BC Managed Care – PPO | Admitting: Physical Therapy

## 2019-03-11 ENCOUNTER — Encounter: Payer: Self-pay | Admitting: Physical Therapy

## 2019-03-11 DIAGNOSIS — J3081 Allergic rhinitis due to animal (cat) (dog) hair and dander: Secondary | ICD-10-CM | POA: Diagnosis not present

## 2019-03-11 DIAGNOSIS — M5441 Lumbago with sciatica, right side: Secondary | ICD-10-CM | POA: Diagnosis not present

## 2019-03-11 DIAGNOSIS — J3089 Other allergic rhinitis: Secondary | ICD-10-CM | POA: Diagnosis not present

## 2019-03-11 DIAGNOSIS — M62830 Muscle spasm of back: Secondary | ICD-10-CM | POA: Diagnosis not present

## 2019-03-11 DIAGNOSIS — G8929 Other chronic pain: Secondary | ICD-10-CM

## 2019-03-11 DIAGNOSIS — R293 Abnormal posture: Secondary | ICD-10-CM

## 2019-03-11 DIAGNOSIS — M6281 Muscle weakness (generalized): Secondary | ICD-10-CM | POA: Diagnosis not present

## 2019-03-11 DIAGNOSIS — J301 Allergic rhinitis due to pollen: Secondary | ICD-10-CM | POA: Diagnosis not present

## 2019-03-11 NOTE — Therapy (Signed)
[facility] [address], [place], [place], [address] Phone: [phone]   Fax:  [phone]  Physical Therapy Treatment  Patient Details  Name: KELBY ADELL MRN: [id number] Date of Birth: 1955-09-23 Referring Provider (PT): Dr. Joni Fears   Encounter Date: 03/11/2019  PT End of Session - 03/11/19 0753    Visit Number  5    Date for PT Re-Evaluation  04/10/19    Authorization Type  BCBS    PT Start Time  0753    PT Stop Time  0845    PT Time Calculation (min)  52 min    Activity Tolerance  Patient tolerated treatment well    Behavior During Therapy  [facility] for tasks assessed/performed       Past Medical History:  Diagnosis Date  . Allergy   . Basal cell carcinoma    scalp  . Gout   . Hyperlipidemia     Past Surgical History:  Procedure Laterality Date  . APPENDECTOMY    . BASAL CELL CARCINOMA EXCISION    . CERVICAL DISC SURGERY    . TONSILLECTOMY      There were no vitals filed for this visit.  Subjective Assessment - 03/11/19 0759    Subjective  I woke up Friday morning with increased pain. It has lingered all weekend, nothing has changed it.    Currently in Pain?  Yes    Pain Score  6     Pain Location  Back    Pain Orientation  Right    Pain Descriptors / Indicators  Radiating   Down to knee   Pain Onset  More than a month ago                       [facility] Adult PT Treatment/Exercise - 03/11/19 0001      Lumbar Exercises: Stretches   Single Knee to Chest Stretch  Right;Left;1 rep;30 seconds    Single Knee to Chest Stretch Limitations  Post bridge series    Piriformis Stretch  Right;Left;2 reps;30 seconds   supine   Other Lumbar Stretch Exercise  Hand to Big Toe with strap RT   Vc to how perform, TC to depress pelvis     Lumbar Exercises: Aerobic   Elliptical  L3 R4 x 6 min      Lumbar Exercises: Standing   Other Standing Lumbar Exercises  Squats with pelvis  thrust holding 10# wt 2x10   VC to contract gluteals     Lumbar Exercises: Supine   Bridge  --   Articulating bridge/segmental 15x   Bridge Limitations  Second set: feet on red ball/articulating bridge 5x, VC to contract gluteals more    Other Supine Lumbar Exercises  Pilates single leg circles with green band for support 6x each dir Bil    VC to keept pelvis still     Manual Therapy   Soft tissue mobilization  Addaday assitited RT gluteals, lumbar               PT Short Term Goals - 03/07/19 1053      PT SHORT TERM GOAL #2   Title  low back pain into right leg is intermittent instead of constant    Time  4    Period  Weeks    Status  Achieved    Target Date  03/13/19      PT SHORT TERM GOAL #3   Title  when  sit for a period of time will limp 25% less    Time  4    Period  Weeks    Status  On-going    Target Date  03/13/19        PT Long Term Goals - 02/13/19 0857      PT LONG TERM GOAL #1   Title  The patient will be indep with HEP and understand how to progress himself    Baseline  --    Time  8    Period  Weeks    Status  New    Target Date  04/10/19      PT LONG TERM GOAL #2   Title  able to sit for 30 minutes and get up to walk without a limp due to improved mobility and decreased pain    Baseline  -    Time  8    Period  Weeks    Status  New    Target Date  04/10/19      PT LONG TERM GOAL #3   Title  Pain after walking decreased >/= 50% due to improved mobility    Baseline  -    Time  8    Period  Weeks    Status  New    Target Date  04/10/19      PT LONG TERM GOAL #4   Title  after waking up in the morning pain decreased >/= 50% with less of a limp on the right    Time  8    Period  Weeks    Status  New    Target Date  04/10/19      PT LONG TERM GOAL #5   Title  FOTO score </= 27% limitation    Time  8    Period  Weeks    Status  New    Target Date  04/10/19            Plan - 03/11/19 0754    Clinical Impression  Statement  Pt presents today with an increased complaint of pain across the back and down into the posterior RT leg to the knee. He is unsure why the pain has increased. He performed flexibility exercises for his hip muscles and exercises that focused on spinal articulation. All exercises had pt include core stabilization. Pain reduced to 2-3/10 at end of session.    Personal Factors and Comorbidities  Time since onset of injury/illness/exacerbation    Examination-Activity Limitations  Locomotion Level;Bend;Stand    Stability/Clinical Decision Making  Evolving/Moderate complexity    Rehab Potential  Excellent    PT Frequency  2x / week    PT Duration  8 weeks    PT Treatment/Interventions  ADLs/Self Care Home Management;Cryotherapy;Electrical Stimulation;Therapeutic activities;Therapeutic exercise;Neuromuscular re-education;Patient/family education;Manual techniques;Passive range of motion;Dry needling;Spinal Manipulations;Joint Manipulations    PT Next Visit Plan  core strength with lifting, soft tissue work to the lumbar paraspinals    PT Home Exercise Plan  Access Code: [email or web address]    Consulted and Agree with Plan of Care  Patient       Patient will benefit from skilled therapeutic intervention in order to improve the following deficits and impairments:  Abnormal gait, Decreased range of motion, Increased fascial restricitons, Increased muscle spasms, Decreased activity tolerance, Pain, Decreased mobility, Decreased strength, Postural dysfunction  Visit Diagnosis: 1. Muscle weakness (generalized)   2. Chronic right-sided low back pain with right-sided sciatica   3. Muscle spasm of back  4. Abnormal posture        Problem List Patient Active Problem List   Diagnosis Date Noted  . Chronic right-sided low back pain with right-sided sciatica 02/12/2019  . Gout 08/07/2016  . H/O seasonal allergies 08/07/2016  . Hyperlipidemia 08/07/2016  . Chronic pain of both knees 08/07/2016  .  Erectile dysfunction 08/05/2016  . Gastroesophageal reflux disease 08/05/2016    COCHRAN,JENNIFER, PTA 03/11/2019, 8:52 AM  [facility] [address] [place], [place], [address] Phone: [phone]   Fax:  [phone]  Name: NICKALUS THORNSBERRY MRN: [id number] Date of Birth: [birth year]

## 2019-03-13 ENCOUNTER — Other Ambulatory Visit: Payer: Self-pay

## 2019-03-13 ENCOUNTER — Ambulatory Visit: Payer: BC Managed Care – PPO | Admitting: Physical Therapy

## 2019-03-13 ENCOUNTER — Encounter: Payer: Self-pay | Admitting: Physical Therapy

## 2019-03-13 DIAGNOSIS — M5441 Lumbago with sciatica, right side: Secondary | ICD-10-CM | POA: Diagnosis not present

## 2019-03-13 DIAGNOSIS — G8929 Other chronic pain: Secondary | ICD-10-CM

## 2019-03-13 DIAGNOSIS — M62830 Muscle spasm of back: Secondary | ICD-10-CM

## 2019-03-13 DIAGNOSIS — R293 Abnormal posture: Secondary | ICD-10-CM

## 2019-03-13 DIAGNOSIS — M6281 Muscle weakness (generalized): Secondary | ICD-10-CM

## 2019-03-13 NOTE — Therapy (Signed)
[facility] [address] [place], [place], [address] Phone: [phone]   Fax:  [phone]  Physical Therapy Treatment  Patient Details  Name: Stanley Bennett MRN: [id number] Date of Birth: 1955-12-31 Referring Provider (PT): Dr. Joni Fears   Encounter Date: 03/13/2019  PT End of Session - 03/13/19 0957    Visit Number  6    Date for PT Re-Evaluation  04/10/19    Authorization Type  BCBS    PT Start Time  0957    PT Stop Time  1045    PT Time Calculation (min)  48 min    Activity Tolerance  Patient tolerated treatment well    Behavior During Therapy  [facility] for tasks assessed/performed       Past Medical History:  Diagnosis Date  . Allergy   . Basal cell carcinoma    scalp  . Gout   . Hyperlipidemia     Past Surgical History:  Procedure Laterality Date  . APPENDECTOMY    . BASAL CELL CARCINOMA EXCISION    . CERVICAL DISC SURGERY    . TONSILLECTOMY      There were no vitals filed for this visit.  Subjective Assessment - 03/13/19 0958    Subjective  I feel the same as I did on Monday. I feel like the exercises and maual work are beneficial despite pain reported as "the same."    Currently in Pain?  Yes    Pain Score  6     Pain Location  Back    Pain Orientation  Right    Pain Descriptors / Indicators  Radiating   Starts LTLB, across the low back, down th efront of the thigh and medial   Multiple Pain Sites  No                       [facility] Adult PT Treatment/Exercise - 03/13/19 0001      Lumbar Exercises: Stretches   Piriformis Stretch  Right;2 reps;30 seconds    Figure 4 Stretch Limitations  HIp ER self mobiization to do for HEP    Other Lumbar Stretch Exercise  Hand to Big Toe with strap RT   20 sec in each position: 2 sets     Lumbar Exercises: Aerobic   Elliptical  L4 R 6 x 8 min concurrent discussion of pain/status      Lumbar Exercises: Standing   Shoulder  Extension Limitations  Standing red band anti-rotation  arms movements to emphasize core strength 10x each side.     Other Standing Lumbar Exercises  Squats with pelvis thrust holding 10# wt 2x15   VC to contract gluteals   Other Standing Lumbar Exercises  tall kneel rolling the red physioball forward engaging the abdominals   Stopped at 6x dt hamstring cramp     Lumbar Exercises: Supine   Bridge  --   Articulating bridge/segmental 15x   Bridge Limitations  Modified bridge 80% RTLE lifs 20% LT for just assistance      Manual Therapy   Joint Mobilization  S/L Mulligan hip mobilizations RT    Soft tissue mobilization  Addaday assitited RT gluteals, lumbar   additional time around Rt G trochanter            PT Education - 03/13/19 1019    Education Details  HEP for self hip ER mobilization    Person(s) Educated  Patient    Methods  Explanation;Demonstration;Tactile cues;Verbal  cues;Handout    Comprehension  Returned demonstration;Verbalized understanding       PT Short Term Goals - 03/07/19 1053      PT SHORT TERM GOAL #2   Title  low back pain into right leg is intermittent instead of constant    Time  4    Period  Weeks    Status  Achieved    Target Date  03/13/19      PT SHORT TERM GOAL #3   Title  when sit for a period of time will limp 25% less    Time  4    Period  Weeks    Status  On-going    Target Date  03/13/19        PT Long Term Goals - 02/13/19 0857      PT LONG TERM GOAL #1   Title  The patient will be indep with HEP and understand how to progress himself    Baseline  --    Time  8    Period  Weeks    Status  New    Target Date  04/10/19      PT LONG TERM GOAL #2   Title  able to sit for 30 minutes and get up to walk without a limp due to improved mobility and decreased pain    Baseline  -    Time  8    Period  Weeks    Status  New    Target Date  04/10/19      PT LONG TERM GOAL #3   Title  Pain after walking decreased >/= 50% due to  improved mobility    Baseline  -    Time  8    Period  Weeks    Status  New    Target Date  04/10/19      PT LONG TERM GOAL #4   Title  after waking up in the morning pain decreased >/= 50% with less of a limp on the right    Time  8    Period  Weeks    Status  New    Target Date  04/10/19      PT LONG TERM GOAL #5   Title  FOTO score </= 27% limitation    Time  8    Period  Weeks    Status  New    Target Date  04/10/19            Plan - 03/13/19 0957    Clinical Impression Statement  Pt arrives with verbal reports of PT helping, despite additional reports of pain being about the same as on Monday. Pain does not worsen withhis exercises. RT hip ER has joint tightness affecting his ability to stretch the muscles. Taught pt how to perfrom home self mobiizations for his RT hip ER. Pt had no pain at end of session.    Personal Factors and Comorbidities  Time since onset of injury/illness/exacerbation    Examination-Activity Limitations  Locomotion Level;Bend;Stand    Stability/Clinical Decision Making  Evolving/Moderate complexity    Rehab Potential  Excellent    PT Frequency  2x / week    PT Duration  8 weeks    PT Treatment/Interventions  ADLs/Self Care Home Management;Cryotherapy;Electrical Stimulation;Therapeutic activities;Therapeutic exercise;Neuromuscular re-education;Patient/family education;Manual techniques;Passive range of motion;Dry needling;Spinal Manipulations;Joint Manipulations    PT Next Visit Plan  core strength with lifting, soft tissue work to the lumbar paraspinals, hip flexibility for ER  PT Home Exercise Plan  Access Code: [email or web address]    Consulted and Agree with Plan of Care  Patient       Patient will benefit from skilled therapeutic intervention in order to improve the following deficits and impairments:  Abnormal gait, Decreased range of motion, Increased fascial restricitons, Increased muscle spasms, Decreased activity tolerance, Pain, Decreased  mobility, Decreased strength, Postural dysfunction  Visit Diagnosis: 1. Muscle weakness (generalized)   2. Chronic right-sided low back pain with right-sided sciatica   3. Muscle spasm of back   4. Abnormal posture        Problem List Patient Active Problem List   Diagnosis Date Noted  . Chronic right-sided low back pain with right-sided sciatica 02/12/2019  . Gout 08/07/2016  . H/O seasonal allergies 08/07/2016  . Hyperlipidemia 08/07/2016  . Chronic pain of both knees 08/07/2016  . Erectile dysfunction 08/05/2016  . Gastroesophageal reflux disease 08/05/2016    ,, PTA 03/13/2019, 10:49 AM  [facility] [address] [place], [place], [address] Phone: [phone]   Fax:  [phone]  Name: TREV BOLEY MRN: [id number] Date of Birth: 29-May-1956

## 2019-03-18 ENCOUNTER — Ambulatory Visit: Payer: BC Managed Care – PPO | Attending: Orthopaedic Surgery | Admitting: Physical Therapy

## 2019-03-18 ENCOUNTER — Encounter: Payer: Self-pay | Admitting: Physical Therapy

## 2019-03-18 ENCOUNTER — Other Ambulatory Visit: Payer: Self-pay

## 2019-03-18 DIAGNOSIS — M5441 Lumbago with sciatica, right side: Secondary | ICD-10-CM | POA: Diagnosis not present

## 2019-03-18 DIAGNOSIS — M6281 Muscle weakness (generalized): Secondary | ICD-10-CM | POA: Diagnosis not present

## 2019-03-18 DIAGNOSIS — R293 Abnormal posture: Secondary | ICD-10-CM | POA: Insufficient documentation

## 2019-03-18 DIAGNOSIS — M62830 Muscle spasm of back: Secondary | ICD-10-CM

## 2019-03-18 DIAGNOSIS — G8929 Other chronic pain: Secondary | ICD-10-CM

## 2019-03-18 NOTE — Therapy (Signed)
[facility] [address], [place], [place], [address] Phone: [phone]   Fax:  [phone]  Physical Therapy Treatment  Patient Details  Name: Stanley Bennett MRN: [id number] Date of Birth: 08/26/1955 Referring Provider (PT): Dr. Joni Fears   Encounter Date: 03/18/2019  PT End of Session - 03/18/19 1106    Visit Number  7    Date for PT Re-Evaluation  04/10/19    Authorization Type  BCBS    PT Start Time  1105    PT Stop Time  1145    PT Time Calculation (min)  40 min    Activity Tolerance  Patient tolerated treatment well    Behavior During Therapy  [facility] for tasks assessed/performed       Past Medical History:  Diagnosis Date  . Allergy   . Basal cell carcinoma    scalp  . Gout   . Hyperlipidemia     Past Surgical History:  Procedure Laterality Date  . APPENDECTOMY    . BASAL CELL CARCINOMA EXCISION    . CERVICAL DISC SURGERY    . TONSILLECTOMY      There were no vitals filed for this visit.  Subjective Assessment - 03/18/19 1107    Subjective  Played golf Friday and only felt stiff. Pain mainly at night when I lay down.                       [facility] Adult PT Treatment/Exercise - 03/18/19 0001      Lumbar Exercises: Stretches   Piriformis Stretch  Right;2 reps;30 seconds    Figure 4 Stretch Limitations  HIp ER self mobiization to do for HEP   review then added active ER 10x2 with spikey ball   Other Lumbar Stretch Exercise  Hand to Big Toe with strap RT   20 sec in each position: 2 sets     Lumbar Exercises: Aerobic   Elliptical  L4 R 6 x 8 min concurrent discussion of pain/status      Lumbar Exercises: Standing   Side Lunge  --   at pulley 25# 6x each dir   Other Standing Lumbar Exercises  Squats with 25# weight 2x10 VC to contract gluteals      Lumbar Exercises: Supine   Bridge  --   Articulating bridge/segmental 15x   Bridge Limitations  Modified bridge 80%  RTLE lifs 20% LT for just assistance   10x   Other Supine Lumbar Exercises  Pilates single leg stretch 10x each side    Other Supine Lumbar Exercises  Ab curl 6x. Scissors head down 6x.                PT Short Term Goals - 03/18/19 1111      PT SHORT TERM GOAL #3   Title  when sit for a period of time will limp 25% less    Time  4    Period  Weeks    Status  Achieved   40%-50%       PT Long Term Goals - 02/13/19 0857      PT LONG TERM GOAL #1   Title  The patient will be indep with HEP and understand how to progress himself    Baseline  --    Time  8    Period  Weeks    Status  New    Target Date  04/10/19      PT LONG  TERM GOAL #2   Title  able to sit for 30 minutes and get up to walk without a limp due to improved mobility and decreased pain    Baseline  -    Time  8    Period  Weeks    Status  New    Target Date  04/10/19      PT LONG TERM GOAL #3   Title  Pain after walking decreased >/= 50% due to improved mobility    Baseline  -    Time  8    Period  Weeks    Status  New    Target Date  04/10/19      PT LONG TERM GOAL #4   Title  after waking up in the morning pain decreased >/= 50% with less of a limp on the right    Time  8    Period  Weeks    Status  New    Target Date  04/10/19      PT LONG TERM GOAL #5   Title  FOTO score </= 27% limitation    Time  8    Period  Weeks    Status  New    Target Date  04/10/19            Plan - 03/18/19 1106    Clinical Impression Statement  Pt played golf Friday and did 4 mile walk Saturday. Had some pain after walk but mostly describes his pain as being at night when he lays down. Pt reports his limping after sitting is 40%-50% improved, meeting short term goal.    Personal Factors and Comorbidities  Time since onset of injury/illness/exacerbation    Examination-Activity Limitations  Locomotion Level;Bend;Stand    Stability/Clinical Decision Making  Evolving/Moderate complexity    Rehab  Potential  Excellent    PT Frequency  2x / week    PT Duration  8 weeks    PT Treatment/Interventions  ADLs/Self Care Home Management;Cryotherapy;Electrical Stimulation;Therapeutic activities;Therapeutic exercise;Neuromuscular re-education;Patient/family education;Manual techniques;Passive range of motion;Dry needling;Spinal Manipulations;Joint Manipulations    PT Next Visit Plan  core strength with lifting, soft tissue work to the lumbar paraspinals, hip flexibility for ER    PT Home Exercise Plan  Access Code: [email or web address]    Consulted and Agree with Plan of Care  Patient       Patient will benefit from skilled therapeutic intervention in order to improve the following deficits and impairments:  Abnormal gait, Decreased range of motion, Increased fascial restricitons, Increased muscle spasms, Decreased activity tolerance, Pain, Decreased mobility, Decreased strength, Postural dysfunction  Visit Diagnosis: 1. Muscle weakness (generalized)   2. Chronic right-sided low back pain with right-sided sciatica   3. Muscle spasm of back   4. Abnormal posture        Problem List Patient Active Problem List   Diagnosis Date Noted  . Chronic right-sided low back pain with right-sided sciatica 02/12/2019  . Gout 08/07/2016  . H/O seasonal allergies 08/07/2016  . Hyperlipidemia 08/07/2016  . Chronic pain of both knees 08/07/2016  . Erectile dysfunction 08/05/2016  . Gastroesophageal reflux disease 08/05/2016    Shoshannah Faubert, PTA 03/18/2019, 11:45 AM  [facility] [address] [place], [place], [address] Phone: [phone]   Fax:  [phone]  Name: Stanley Bennett MRN: [id number] Date of Birth: Mar 24, 1956

## 2019-03-20 ENCOUNTER — Other Ambulatory Visit: Payer: Self-pay

## 2019-03-20 ENCOUNTER — Encounter: Payer: Self-pay | Admitting: Physical Therapy

## 2019-03-20 ENCOUNTER — Ambulatory Visit: Payer: BC Managed Care – PPO | Admitting: Physical Therapy

## 2019-03-20 DIAGNOSIS — J3089 Other allergic rhinitis: Secondary | ICD-10-CM | POA: Diagnosis not present

## 2019-03-20 DIAGNOSIS — M6281 Muscle weakness (generalized): Secondary | ICD-10-CM | POA: Diagnosis not present

## 2019-03-20 DIAGNOSIS — R293 Abnormal posture: Secondary | ICD-10-CM

## 2019-03-20 DIAGNOSIS — G8929 Other chronic pain: Secondary | ICD-10-CM | POA: Diagnosis not present

## 2019-03-20 DIAGNOSIS — M62830 Muscle spasm of back: Secondary | ICD-10-CM | POA: Diagnosis not present

## 2019-03-20 DIAGNOSIS — J301 Allergic rhinitis due to pollen: Secondary | ICD-10-CM | POA: Diagnosis not present

## 2019-03-20 DIAGNOSIS — J3081 Allergic rhinitis due to animal (cat) (dog) hair and dander: Secondary | ICD-10-CM | POA: Diagnosis not present

## 2019-03-20 DIAGNOSIS — M5441 Lumbago with sciatica, right side: Secondary | ICD-10-CM | POA: Diagnosis not present

## 2019-03-20 NOTE — Therapy (Signed)
[facility] [address], [place], [place], [address] Phone: [phone]   Fax:  [phone]  Physical Therapy Treatment  Patient Details  Name: Stanley Bennett MRN: [id number] Date of Birth: 19-Nov-1955 Referring Provider (PT): Dr. Joni Fears   Encounter Date: 03/20/2019  PT End of Session - 03/20/19 0935    Visit Number  8    Date for PT Re-Evaluation  04/10/19    Authorization Type  BCBS    PT Start Time  0900    PT Stop Time  0569    PT Time Calculation (min)  38 min    Activity Tolerance  Patient tolerated treatment well;No increased pain    Behavior During Therapy  WFL for tasks assessed/performed       Past Medical History:  Diagnosis Date  . Allergy   . Basal cell carcinoma    scalp  . Gout   . Hyperlipidemia     Past Surgical History:  Procedure Laterality Date  . APPENDECTOMY    . BASAL CELL CARCINOMA EXCISION    . CERVICAL DISC SURGERY    . TONSILLECTOMY      There were no vitals filed for this visit.  Subjective Assessment - 03/20/19 0904    Subjective  I still feel the pain and had to take advil. Groin pain is 60% better.    Currently in Pain?  Yes    Pain Location  Back    Pain Orientation  Right    Pain Descriptors / Indicators  Radiating    Pain Type  Chronic pain    Pain Radiating Towards  radiate into the right groin    Pain Onset  More than a month ago    Pain Frequency  Intermittent    Aggravating Factors   when going to bed, sitting more than 30 minutes,    Pain Relieving Factors  while walking         [facility] PT Assessment - 03/20/19 0001      Assessment   Medical Diagnosis  M54.41, G89.29 Chronic right-sided low back pain with right-sided sciatica    Referring Provider (PT)  Dr. Joni Fears      AROM   Lumbar Flexion  decreased by 25% with tightness in low lumbar fascia    Lumbar Extension  decreased by 25%    Lumbar - Right Side Bend  decreased by 25%     Lumbar - Left Side Bend  decreased by 25%      PROM   Right Hip External Rotation   50   60 degrees after mobilization   Left Hip External Rotation   70                   [facility] Adult PT Treatment/Exercise - 03/20/19 0001      Lumbar Exercises: Aerobic   Elliptical  L4 R 6 x 8 min concurrent discussion of pain/status      Lumbar Exercises: Standing   Side Lunge  10 reps;1 second   to the right   Other Standing Lumbar Exercises  Squats with 25# weight 2x10 VC to contract gluteals    Other Standing Lumbar Exercises  stand dead lift with tactile cues to keep back extended and using gluteals,       Lumbar Exercises: Prone   Opposite Arm/Leg Raise  Right arm/Left leg;Left arm/Right leg;20 reps;1 second   VC to not move the hips   Other Prone Lumbar Exercises  prone right hip extension with knee flexed and tactile cues to right gluteal    Other Prone Lumbar Exercises  childs pose to quadrupen with therapist upslide right L2-L5, then move hands to the left      Manual Therapy   Manual Therapy  Joint mobilization;Soft tissue mobilization    Joint Mobilization  using a strap t omobilize the right for lateral glide and inferior; prone manual mobilization for anterior glide of right hip    Soft tissue mobilization  using the addaday to massage the right hamstring, gluteal and lumbar paraspinals with right leg in SLR on left hand side               PT Short Term Goals - 03/18/19 1111      PT SHORT TERM GOAL #3   Title  when sit for a period of time will limp 25% less    Time  4    Period  Weeks    Status  Achieved   40%-50%       PT Long Term Goals - 02/13/19 0857      PT LONG TERM GOAL #1   Title  The patient will be indep with HEP and understand how to progress himself    Baseline  --    Time  8    Period  Weeks    Status  New    Target Date  04/10/19      PT LONG TERM GOAL #2   Title  able to sit for 30 minutes and get up to walk without a limp due  to improved mobility and decreased pain    Baseline  -    Time  8    Period  Weeks    Status  New    Target Date  04/10/19      PT LONG TERM GOAL #3   Title  Pain after walking decreased >/= 50% due to improved mobility    Baseline  -    Time  8    Period  Weeks    Status  New    Target Date  04/10/19      PT LONG TERM GOAL #4   Title  after waking up in the morning pain decreased >/= 50% with less of a limp on the right    Time  8    Period  Weeks    Status  New    Target Date  04/10/19      PT LONG TERM GOAL #5   Title  FOTO score </= 27% limitation    Time  8    Period  Weeks    Status  New    Target Date  04/10/19            Plan - 03/20/19 0936    Clinical Impression Statement  Patient right groin pain is 50% better. After therapy patient right hip improved from 50 to 60 degrees and no pain. Patient has improved trunk stability with lifting 25# weight. Patient is able to extend right hip with improved right gluteal contractioin. Patient will benefit from skilled therapy to work on core strength and pain reduction.    Personal Factors and Comorbidities  Time since onset of injury/illness/exacerbation    Stability/Clinical Decision Making  Evolving/Moderate complexity    Rehab Potential  Excellent    PT Frequency  2x / week    PT Duration  8 weeks    PT Treatment/Interventions  ADLs/Self Care  Home Management;Cryotherapy;Electrical Stimulation;Therapeutic activities;Therapeutic exercise;Neuromuscular re-education;Patient/family education;Manual techniques;Passive range of motion;Dry needling;Spinal Manipulations;Joint Manipulations    PT Next Visit Plan  core strength with lifting, soft tissue work to the lumbar paraspinals, hip flexibility for ER    PT Home Exercise Plan  Access Code: [email or web address]    Consulted and Agree with Plan of Care  Patient       Patient will benefit from skilled therapeutic intervention in order to improve the following deficits and  impairments:  Abnormal gait, Decreased range of motion, Increased fascial restricitons, Increased muscle spasms, Decreased activity tolerance, Pain, Decreased mobility, Decreased strength, Postural dysfunction  Visit Diagnosis: 1. Muscle weakness (generalized)   2. Chronic right-sided low back pain with right-sided sciatica   3. Muscle spasm of back   4. Abnormal posture        Problem List Patient Active Problem List   Diagnosis Date Noted  . Chronic right-sided low back pain with right-sided sciatica 02/12/2019  . Gout 08/07/2016  . H/O seasonal allergies 08/07/2016  . Hyperlipidemia 08/07/2016  . Chronic pain of both knees 08/07/2016  . Erectile dysfunction 08/05/2016  . Gastroesophageal reflux disease 08/05/2016    Earlie Counts, PT 03/20/19 9:40 AM   [facility] [address], [place], [place], [address] Phone: [phone]   Fax:  [phone]  Name: Stanley Bennett MRN: [id number] Date of Birth: Jul 19, 1956

## 2019-03-25 ENCOUNTER — Other Ambulatory Visit: Payer: Self-pay

## 2019-03-25 ENCOUNTER — Encounter: Payer: Self-pay | Admitting: Physical Therapy

## 2019-03-25 ENCOUNTER — Ambulatory Visit: Payer: BC Managed Care – PPO | Admitting: Physical Therapy

## 2019-03-25 DIAGNOSIS — M5441 Lumbago with sciatica, right side: Secondary | ICD-10-CM

## 2019-03-25 DIAGNOSIS — G8929 Other chronic pain: Secondary | ICD-10-CM | POA: Diagnosis not present

## 2019-03-25 DIAGNOSIS — M6281 Muscle weakness (generalized): Secondary | ICD-10-CM | POA: Diagnosis not present

## 2019-03-25 DIAGNOSIS — M62830 Muscle spasm of back: Secondary | ICD-10-CM | POA: Diagnosis not present

## 2019-03-25 DIAGNOSIS — R293 Abnormal posture: Secondary | ICD-10-CM | POA: Diagnosis not present

## 2019-03-25 NOTE — Therapy (Signed)
[facility] [address], [place], [place], [address] Phone: [phone]   Fax:  [phone]  Physical Therapy Treatment  Patient Details  Name: Stanley Bennett MRN: [id number] Date of Birth: 08/23/1955 Referring Provider (PT): Dr. Joni Fears   Encounter Date: 03/25/2019  PT End of Session - 03/25/19 1236    Visit Number  9    Date for PT Re-Evaluation  04/10/19    Authorization Type  BCBS    PT Start Time  1200    PT Stop Time  1238    PT Time Calculation (min)  38 min    Activity Tolerance  Patient tolerated treatment well;No increased pain    Behavior During Therapy  WFL for tasks assessed/performed       Past Medical History:  Diagnosis Date  . Allergy   . Basal cell carcinoma    scalp  . Gout   . Hyperlipidemia     Past Surgical History:  Procedure Laterality Date  . APPENDECTOMY    . BASAL CELL CARCINOMA EXCISION    . CERVICAL DISC SURGERY    . TONSILLECTOMY      There were no vitals filed for this visit.  Subjective Assessment - 03/25/19 1204    Subjective  This weekend I have had a sharp pain in the right lumbar.    Patient Stated Goals  reduce pain    Currently in Pain?  Yes    Pain Score  4    high 8/10   Pain Location  Back    Pain Orientation  Right    Pain Descriptors / Indicators  Sharp;Shooting    Pain Type  Chronic pain    Pain Onset  More than a month ago    Pain Frequency  Intermittent    Aggravating Factors   when going to bed, sitting more than 30 minutes    Pain Relieving Factors  while walking    Multiple Pain Sites  No                       [facility] Adult PT Treatment/Exercise - 03/25/19 0001      Lumbar Exercises: Stretches   Other Lumbar Stretch Exercise  stand with right leg behind left and lean to left      Lumbar Exercises: Aerobic   Elliptical  L4 R 6 x 8 min concurrent discussion of pain/status      Lumbar Exercises: Standing   Forward Lunge  15 reps;1 second   with right foot forward   Side Lunge  10 reps;1 second   to the right   Scapular Retraction  Strengthening;Both;20 reps;Theraband    Theraband Level (Scapular Retraction)  Level 3 (Green)    Scapular Retraction Limitations  standing on right leg    Other Standing Lumbar Exercises  stand in lunge with green band and move trunk side ways ; Squats with 25# weight 2x10 VC to contract gluteals    Other Standing Lumbar Exercises  stand dead lift with 2# on right with tactile cues to keep back extended and using gluteals,       Lumbar Exercises: Prone   Opposite Arm/Leg Raise  Right arm/Left leg;Left arm/Right leg;20 reps;1 second   VC to not move the hips   Other Prone Lumbar Exercises  prone right hip extension with knee flexed and tactile cues to right gluteal 20 times    Other Prone Lumbar Exercises  childs pose to  quadrupen with therapist upslide right L2-L5, then move hands to the left      Manual Therapy   Manual Therapy  Soft tissue mobilization    Soft tissue mobilization  right lumbar paraspinals for elongation       Trigger Point Dry Needling - 03/25/19 0001    Consent Given?  Yes    Muscles Treated Back/Hip  Lumbar multifidi   right L1-L5   Lumbar multifidi Response  Twitch response elicited;Palpable increased muscle length             PT Short Term Goals - 03/18/19 1111      PT SHORT TERM GOAL #3   Title  when sit for a period of time will limp 25% less    Time  4    Period  Weeks    Status  Achieved   40%-50%       PT Long Term Goals - 03/25/19 1206      PT LONG TERM GOAL #1   Title  The patient will be indep with HEP and understand how to progress himself    Time  8    Period  Weeks    Status  On-going      PT LONG TERM GOAL #2   Title  able to sit for 30 minutes and get up to walk without a limp due to improved mobility and decreased pain    Baseline  limp improved by 50%    Time  8    Period  Weeks    Status   On-going      PT LONG TERM GOAL #3   Title  Pain after walking decreased >/= 50% due to improved mobility    Baseline  decreased by 40%    Time  8    Period  Weeks    Status  On-going      PT LONG TERM GOAL #4   Title  after waking up in the morning pain decreased >/= 50% with less of a limp on the right    Time  8    Period  Weeks    Status  On-going      PT LONG TERM GOAL #5   Title  FOTO score </= 27% limitation    Time  8    Period  Weeks    Status  New            Plan - 03/25/19 1220    Clinical Impression Statement  Patient has increased pain over the weekend. Patient had trigger points on the right lumbar paraspinals. After manual work patient did not have pain. Patient has decreased balance with standing on right leg. Patient has 50% less pain in right leg with walking. Patient reports his limp is 50% less. Patient will benefit from skilled therapy to work on core strength and pain reduction.    Personal Factors and Comorbidities  Time since onset of injury/illness/exacerbation    Examination-Activity Limitations  Locomotion Level;Bend;Stand    Stability/Clinical Decision Making  Evolving/Moderate complexity    Rehab Potential  Excellent    PT Frequency  2x / week    PT Duration  8 weeks    PT Treatment/Interventions  ADLs/Self Care Home Management;Cryotherapy;Electrical Stimulation;Therapeutic activities;Therapeutic exercise;Neuromuscular re-education;Patient/family education;Manual techniques;Passive range of motion;Dry needling;Spinal Manipulations;Joint Manipulations    PT Next Visit Plan  core strength with lifting, soft tissue work to the lumbar paraspinals,    PT Home Exercise Plan  Access Code: [email or web address]  Consulted and Agree with Plan of Care  Patient       Patient will benefit from skilled therapeutic intervention in order to improve the following deficits and impairments:  Abnormal gait, Decreased range of motion, Increased fascial restricitons, Increased  muscle spasms, Decreased activity tolerance, Pain, Decreased mobility, Decreased strength, Postural dysfunction  Visit Diagnosis: 1. Muscle weakness (generalized)   2. Chronic right-sided low back pain with right-sided sciatica   3. Muscle spasm of back   4. Abnormal posture        Problem List Patient Active Problem List   Diagnosis Date Noted  . Chronic right-sided low back pain with right-sided sciatica 02/12/2019  . Gout 08/07/2016  . H/O seasonal allergies 08/07/2016  . Hyperlipidemia 08/07/2016  . Chronic pain of both knees 08/07/2016  . Erectile dysfunction 08/05/2016  . Gastroesophageal reflux disease 08/05/2016    Earlie Counts, PT 03/25/19 12:39 PM   [facility] [address], [place], [place], [address] Phone: [phone]   Fax:  [phone]  Name: Stanley Bennett MRN: [id number] Date of Birth: 25-Jul-1956

## 2019-03-27 ENCOUNTER — Ambulatory Visit: Payer: BC Managed Care – PPO | Admitting: Physical Therapy

## 2019-03-27 ENCOUNTER — Other Ambulatory Visit: Payer: Self-pay

## 2019-03-27 ENCOUNTER — Encounter: Payer: Self-pay | Admitting: Physical Therapy

## 2019-03-27 DIAGNOSIS — G8929 Other chronic pain: Secondary | ICD-10-CM | POA: Diagnosis not present

## 2019-03-27 DIAGNOSIS — M62830 Muscle spasm of back: Secondary | ICD-10-CM

## 2019-03-27 DIAGNOSIS — J301 Allergic rhinitis due to pollen: Secondary | ICD-10-CM | POA: Diagnosis not present

## 2019-03-27 DIAGNOSIS — M5441 Lumbago with sciatica, right side: Secondary | ICD-10-CM

## 2019-03-27 DIAGNOSIS — M6281 Muscle weakness (generalized): Secondary | ICD-10-CM | POA: Diagnosis not present

## 2019-03-27 DIAGNOSIS — R293 Abnormal posture: Secondary | ICD-10-CM | POA: Diagnosis not present

## 2019-03-27 DIAGNOSIS — J3081 Allergic rhinitis due to animal (cat) (dog) hair and dander: Secondary | ICD-10-CM | POA: Diagnosis not present

## 2019-03-27 DIAGNOSIS — J3089 Other allergic rhinitis: Secondary | ICD-10-CM | POA: Diagnosis not present

## 2019-03-27 NOTE — Therapy (Signed)
[facility] [address] [place], [place], [address] Phone: [phone]   Fax:  [phone]  Physical Therapy Treatment  Patient Details  Name: Stanley Bennett MRN: [id number] Date of Birth: Nov 04, 1955 Referring Provider (PT): Dr. Joni Fears   Encounter Date: 03/27/2019  PT End of Session - 03/27/19 1018    Visit Number  10    Date for PT Re-Evaluation  04/10/19    Authorization Type  BCBS    PT Start Time  1016    PT Stop Time  1056    PT Time Calculation (min)  40 min    Activity Tolerance  Patient tolerated treatment well    Behavior During Therapy  [facility] for tasks assessed/performed       Past Medical History:  Diagnosis Date  . Allergy   . Basal cell carcinoma    scalp  . Gout   . Hyperlipidemia     Past Surgical History:  Procedure Laterality Date  . APPENDECTOMY    . BASAL CELL CARCINOMA EXCISION    . CERVICAL DISC SURGERY    . TONSILLECTOMY      There were no vitals filed for this visit.  Subjective Assessment - 03/27/19 1020    Subjective  Same pain reports, doing HEP, I continue to get more flexible.    Currently in Pain?  Yes    Pain Score  4     Pain Location  --   back & hip   Pain Orientation  Right    Pain Descriptors / Indicators  Aching;Radiating    Multiple Pain Sites  No                       [facility] Adult PT Treatment/Exercise - 03/27/19 0001      Lumbar Exercises: Aerobic   Elliptical  L4 R 6 x 8 min concurrent discussion of pain/status      Lumbar Exercises: Standing   Forward Lunge  10 reps   Bil, added floor slider for harder hip extension   Shoulder Extension  Strengthening;Both;20 reps;Theraband    Theraband Level (Shoulder Extension)  Level 3 (Green)    Shoulder Extension Limitations  In single leg stance     Other Standing Lumbar Exercises  single leg stanc3 Le 3x 30 sec     Other Standing Lumbar Exercises  stand dead lift with 2# on  right with tactile cues to keep back extended and using gluteals, 2x 10      Lumbar Exercises: Supine   Bridge  --   with hamstring roll outs 10x VC for core     Lumbar Exercises: Prone   Other Prone Lumbar Exercises  Prone plank with hip add/abd 10x on floor sliders      Knee/Hip Exercises: Sidelying   Clams  red 2x10 VC to go farther      Manual Therapy   Joint Mobilization  RT hip for hip external rotation, pain at end range 4x 20 sec               PT Short Term Goals - 03/18/19 1111      PT SHORT TERM GOAL #3   Title  when sit for a period of time will limp 25% less    Time  4    Period  Weeks    Status  Achieved   40%-50%       PT Long Term Goals - 03/25/19 1206  PT LONG TERM GOAL #1   Title  The patient will be indep with HEP and understand how to progress himself    Time  8    Period  Weeks    Status  On-going      PT LONG TERM GOAL #2   Title  able to sit for 30 minutes and get up to walk without a limp due to improved mobility and decreased pain    Baseline  limp improved by 50%    Time  8    Period  Weeks    Status  On-going      PT LONG TERM GOAL #3   Title  Pain after walking decreased >/= 50% due to improved mobility    Baseline  decreased by 40%    Time  8    Period  Weeks    Status  On-going      PT LONG TERM GOAL #4   Title  after waking up in the morning pain decreased >/= 50% with less of a limp on the right    Time  8    Period  Weeks    Status  On-going      PT LONG TERM GOAL #5   Title  FOTO score </= 27% limitation    Time  8    Period  Weeks    Status  New            Plan - 03/27/19 1018    Clinical Impression Statement  Pt reports today pain overall is the same as it has been over the last few weeks. He does reports his flexibility in his back and hip continue to improve. Pt demonstrates significant shaking in his RTLE when performing standing, single leg stance exercises. [place] shaking almost immediately. No  increase in pain when he performs the exercises. Hip external rotation remains tight.    Personal Factors and Comorbidities  Time since onset of injury/illness/exacerbation    Examination-Activity Limitations  Locomotion Level;Bend;Stand    Stability/Clinical Decision Making  Evolving/Moderate complexity    Rehab Potential  Excellent    PT Frequency  2x / week    PT Duration  8 weeks    PT Treatment/Interventions  ADLs/Self Care Home Management;Cryotherapy;Electrical Stimulation;Therapeutic activities;Therapeutic exercise;Neuromuscular re-education;Patient/family education;Manual techniques;Passive range of motion;Dry needling;Spinal Manipulations;Joint Manipulations    PT Home Exercise Plan  Access Code: [email or web address]    Consulted and Agree with Plan of Care  Patient       Patient will benefit from skilled therapeutic intervention in order to improve the following deficits and impairments:  Abnormal gait, Decreased range of motion, Increased fascial restricitons, Increased muscle spasms, Decreased activity tolerance, Pain, Decreased mobility, Decreased strength, Postural dysfunction  Visit Diagnosis: 1. Muscle weakness (generalized)   2. Chronic right-sided low back pain with right-sided sciatica   3. Muscle spasm of back   4. Abnormal posture        Problem List Patient Active Problem List   Diagnosis Date Noted  . Chronic right-sided low back pain with right-sided sciatica 02/12/2019  . Gout 08/07/2016  . H/O seasonal allergies 08/07/2016  . Hyperlipidemia 08/07/2016  . Chronic pain of both knees 08/07/2016  . Erectile dysfunction 08/05/2016  . Gastroesophageal reflux disease 08/05/2016    Griselle Rufer, PTA 03/27/2019, 10:59 AM  [facility] [address] [place], [place], [address] Phone: [phone]   Fax:  [phone]  Name: Stanley Bennett MRN: [id number] Date of  Birth: 1955-09-22

## 2019-04-01 ENCOUNTER — Encounter: Payer: Self-pay | Admitting: Physical Therapy

## 2019-04-01 ENCOUNTER — Ambulatory Visit: Payer: BC Managed Care – PPO | Admitting: Physical Therapy

## 2019-04-01 ENCOUNTER — Other Ambulatory Visit: Payer: Self-pay

## 2019-04-01 DIAGNOSIS — M6281 Muscle weakness (generalized): Secondary | ICD-10-CM | POA: Diagnosis not present

## 2019-04-01 DIAGNOSIS — M62830 Muscle spasm of back: Secondary | ICD-10-CM

## 2019-04-01 DIAGNOSIS — M5441 Lumbago with sciatica, right side: Secondary | ICD-10-CM | POA: Diagnosis not present

## 2019-04-01 DIAGNOSIS — G8929 Other chronic pain: Secondary | ICD-10-CM

## 2019-04-01 DIAGNOSIS — R293 Abnormal posture: Secondary | ICD-10-CM

## 2019-04-01 NOTE — Therapy (Signed)
[facility] [address] [place], [place], [address] Phone: [phone]   Fax:  [phone]  Physical Therapy Treatment  Patient Details  Name: Stanley Bennett MRN: [id number] Date of Birth: [birth year] Referring Provider (PT): Dr. Joni Fears   Encounter Date: 04/01/2019  PT End of Session - 04/01/19 1228    Visit Number  11    Date for PT Re-Evaluation  04/10/19    Authorization Type  BCBS    PT Start Time  1225    PT Stop Time  1305    PT Time Calculation (min)  40 min    Activity Tolerance  Patient tolerated treatment well    Behavior During Therapy  [facility] for tasks assessed/performed       Past Medical History:  Diagnosis Date  . Allergy   . Basal cell carcinoma    scalp  . Gout   . Hyperlipidemia     Past Surgical History:  Procedure Laterality Date  . APPENDECTOMY    . BASAL CELL CARCINOMA EXCISION    . CERVICAL DISC SURGERY    . TONSILLECTOMY      There were no vitals filed for this visit.  Subjective Assessment - 04/01/19 1229    Subjective  Doing good today, no pain. Took Advil yesterday and last night and that helps.    Currently in Pain?  No/denies    Multiple Pain Sites  No                       [facility] Adult PT Treatment/Exercise - 04/01/19 0001      Lumbar Exercises: Stretches   Single Knee to Chest Stretch  Right;Left;1 rep;30 seconds    Other Lumbar Stretch Exercise  Rt hip Er release x 1 mn, then static stretch 2x 30 sec       Lumbar Exercises: Aerobic   Elliptical  R6 L6 x 8 min discussion on status      Lumbar Exercises: Standing   Side Lunge  10 reps;1 second   to the right, 2 sets   Shoulder Extension  Strengthening;Both;20 reps;Theraband   2 sets   Theraband Level (Shoulder Extension)  Level 3 (Green)    Shoulder Extension Limitations  In single leg stance and standing on black foam pad    Other Standing Lumbar Exercises  Squats 25# 2x15     Other Standing Lumbar Exercises  stand dead lift with 2# on right with tactile cues to keep back extended and using gluteals, 2x 10      Lumbar Exercises: Supine   Bridge  --   with hamstring roll outs 10x2 VC for core     Lumbar Exercises: Quadruped   Other Quadruped Lumbar Exercises  Donkey kick RT 2x10, TC to level pelvis    childs pose stretch b/t sets     Knee/Hip Exercises: Sidelying   Clams  green 2x10   VC to go farther.   Other Sidelying Knee/Hip Exercises  LT S/L leg circles 10x i neach direction. PTA stabilized pelvis, Vc for > hip extension.      Manual Therapy   Joint Mobilization  Sidelying RT hip Mulligan mobilizations for gd 1, 2                PT Short Term Goals - 03/18/19 1111      PT SHORT TERM GOAL #3   Title  when sit for a period of time will limp  25% less    Time  4    Period  Weeks    Status  Achieved   40%-50%       PT Long Term Goals - 03/25/19 1206      PT LONG TERM GOAL #1   Title  The patient will be indep with HEP and understand how to progress himself    Time  8    Period  Weeks    Status  On-going      PT LONG TERM GOAL #2   Title  able to sit for 30 minutes and get up to walk without a limp due to improved mobility and decreased pain    Baseline  limp improved by 50%    Time  8    Period  Weeks    Status  On-going      PT LONG TERM GOAL #3   Title  Pain after walking decreased >/= 50% due to improved mobility    Baseline  decreased by 40%    Time  8    Period  Weeks    Status  On-going      PT LONG TERM GOAL #4   Title  after waking up in the morning pain decreased >/= 50% with less of a limp on the right    Time  8    Period  Weeks    Status  On-going      PT LONG TERM GOAL #5   Title  FOTO score </= 27% limitation    Time  8    Period  Weeks    Status  New            Plan - 04/01/19 1228    Clinical Impression Statement  Intermittent pain pt reports. Pt demonstratesRt hip extension weakness.  Exercises did not reproduce any pain, pain free at end of session.    Personal Factors and Comorbidities  Time since onset of injury/illness/exacerbation    Examination-Activity Limitations  Locomotion Level;Bend;Stand    Stability/Clinical Decision Making  Evolving/Moderate complexity    Rehab Potential  Excellent    PT Frequency  2x / week    PT Duration  8 weeks    PT Treatment/Interventions  ADLs/Self Care Home Management;Cryotherapy;Electrical Stimulation;Therapeutic activities;Therapeutic exercise;Neuromuscular re-education;Patient/family education;Manual techniques;Passive range of motion;Dry needling;Spinal Manipulations;Joint Manipulations    PT Next Visit Plan  core strength with lifting, Rt hip extension strength, consider giving hip extension in quadruped or sidelying hip circles for HEP progression.    PT Home Exercise Plan  Access Code: [email or web address]    Consulted and Agree with Plan of Care  Patient       Patient will benefit from skilled therapeutic intervention in order to improve the following deficits and impairments:  Abnormal gait, Decreased range of motion, Increased fascial restricitons, Increased muscle spasms, Decreased activity tolerance, Pain, Decreased mobility, Decreased strength, Postural dysfunction  Visit Diagnosis: 1. Muscle weakness (generalized)   2. Chronic right-sided low back pain with right-sided sciatica   3. Muscle spasm of back   4. Abnormal posture        Problem List Patient Active Problem List   Diagnosis Date Noted  . Chronic right-sided low back pain with right-sided sciatica 02/12/2019  . Gout 08/07/2016  . H/O seasonal allergies 08/07/2016  . Hyperlipidemia 08/07/2016  . Chronic pain of both knees 08/07/2016  . Erectile dysfunction 08/05/2016  . Gastroesophageal reflux disease 08/05/2016    Hailei Besser, PTA 04/01/2019, 1:05 PM  [facility] [address] [place], [place], [address] Phone: [phone]   Fax:  [phone]  Name: Stanley Bennett MRN: [id number] Date of Birth: 07-10-1956

## 2019-04-03 ENCOUNTER — Ambulatory Visit: Payer: BC Managed Care – PPO | Admitting: Physical Therapy

## 2019-04-03 ENCOUNTER — Encounter: Payer: Self-pay | Admitting: Physical Therapy

## 2019-04-03 ENCOUNTER — Other Ambulatory Visit: Payer: Self-pay

## 2019-04-03 DIAGNOSIS — M5441 Lumbago with sciatica, right side: Secondary | ICD-10-CM

## 2019-04-03 DIAGNOSIS — M6281 Muscle weakness (generalized): Secondary | ICD-10-CM

## 2019-04-03 DIAGNOSIS — J3089 Other allergic rhinitis: Secondary | ICD-10-CM | POA: Diagnosis not present

## 2019-04-03 DIAGNOSIS — M62830 Muscle spasm of back: Secondary | ICD-10-CM

## 2019-04-03 DIAGNOSIS — J301 Allergic rhinitis due to pollen: Secondary | ICD-10-CM | POA: Diagnosis not present

## 2019-04-03 DIAGNOSIS — J3081 Allergic rhinitis due to animal (cat) (dog) hair and dander: Secondary | ICD-10-CM | POA: Diagnosis not present

## 2019-04-03 DIAGNOSIS — R293 Abnormal posture: Secondary | ICD-10-CM | POA: Diagnosis not present

## 2019-04-03 DIAGNOSIS — G8929 Other chronic pain: Secondary | ICD-10-CM

## 2019-04-03 NOTE — Therapy (Signed)
[facility] [address], [place], [place], [address] Phone: [phone]   Fax:  [phone]  Physical Therapy Treatment  Patient Details  Name: Stanley Bennett MRN: [id number] Date of Birth: 1955-11-14 Referring Provider (PT): Dr. Joni Fears   Encounter Date: 04/03/2019  PT End of Session - 04/03/19 1150    Visit Number  12    Date for PT Re-Evaluation  04/10/19    Authorization Type  BCBS    PT Start Time  7017    PT Stop Time  1223    PT Time Calculation (min)  38 min    Activity Tolerance  Patient tolerated treatment well    Behavior During Therapy  [facility] for tasks assessed/performed       Past Medical History:  Diagnosis Date  . Allergy   . Basal cell carcinoma    scalp  . Gout   . Hyperlipidemia     Past Surgical History:  Procedure Laterality Date  . APPENDECTOMY    . BASAL CELL CARCINOMA EXCISION    . CERVICAL DISC SURGERY    . TONSILLECTOMY      There were no vitals filed for this visit.  Subjective Assessment - 04/03/19 1151    Subjective  No new problems after last session. Continues with RT hip pain that radiates laterally to the proximal knee.    Currently in Pain?  Yes   See subjective 6/10   Multiple Pain Sites  No                       [facility] Adult PT Treatment/Exercise - 04/03/19 0001      Lumbar Exercises: Stretches   Other Lumbar Stretch Exercise  RT hip glute medius stretch 3x 30 sec      Lumbar Exercises: Aerobic   Elliptical  R6 L6 x 8 min discussion on status      Lumbar Exercises: Standing   Side Lunge  15 reps   2 sets   Other Standing Lumbar Exercises  Squats 25# 2x15    Other Standing Lumbar Exercises  stand dead lift with 3# on right with tactile cues to keep back extended and using gluteals, 2x 15      Lumbar Exercises: Supine   Bridge with clamshell  20 reps    Bridge with [employer] Limitations  green      Knee/Hip Exercises:  Sidelying   Other Sidelying Knee/Hip Exercises  LT S/L leg circles 10x i neach direction. PTA stabilized pelvis, Vc for > hip extension.   added to HEP     Shoulder Exercises: Standing   Extension  Strengthening;Both;15 reps;Theraband   standing on black pad 2 sets   Theraband Level (Shoulder Extension)  Level 3 Nyoka Cowden)             PT Education - 04/03/19 1158    Education Details  HEP sidelying hip circles Rt    Person(s) Educated  Patient    Methods  Explanation;Demonstration;Tactile cues;Verbal cues;Handout    Comprehension  Verbalized understanding;Returned demonstration       PT Short Term Goals - 03/18/19 1111      PT SHORT TERM GOAL #3   Title  when sit for a period of time will limp 25% less    Time  4    Period  Weeks    Status  Achieved   40%-50%       PT Long Term Goals -  03/25/19 1206      PT LONG TERM GOAL #1   Title  The patient will be indep with HEP and understand how to progress himself    Time  8    Period  Weeks    Status  On-going      PT LONG TERM GOAL #2   Title  able to sit for 30 minutes and get up to walk without a limp due to improved mobility and decreased pain    Baseline  limp improved by 50%    Time  8    Period  Weeks    Status  On-going      PT LONG TERM GOAL #3   Title  Pain after walking decreased >/= 50% due to improved mobility    Baseline  decreased by 40%    Time  8    Period  Weeks    Status  On-going      PT LONG TERM GOAL #4   Title  after waking up in the morning pain decreased >/= 50% with less of a limp on the right    Time  8    Period  Weeks    Status  On-going      PT LONG TERM GOAL #5   Title  FOTO score </= 27% limitation    Time  8    Period  Weeks    Status  New            Plan - 04/03/19 1150    Clinical Impression Statement  Pain continues intermittently in the RT hip that radiates into the thigh. This pain does not keep him from doing anything he reports today. Rt hip weakness remains  and really show in his single leg stance Rt exercises. It appears today pt did not shake as much.    Personal Factors and Comorbidities  Time since onset of injury/illness/exacerbation    Examination-Activity Limitations  Locomotion Level;Bend;Stand    Stability/Clinical Decision Making  Evolving/Moderate complexity    Rehab Potential  Excellent    PT Frequency  2x / week    PT Duration  8 weeks    PT Treatment/Interventions  ADLs/Self Care Home Management;Cryotherapy;Electrical Stimulation;Therapeutic activities;Therapeutic exercise;Neuromuscular re-education;Patient/family education;Manual techniques;Passive range of motion;Dry needling;Spinal Manipulations;Joint Manipulations    PT Next Visit Plan  core strength with lifting, Rt hip extension strength    PT Home Exercise Plan  Access Code: [email or web address]    Consulted and Agree with Plan of Care  Patient       Patient will benefit from skilled therapeutic intervention in order to improve the following deficits and impairments:  Abnormal gait, Decreased range of motion, Increased fascial restricitons, Increased muscle spasms, Decreased activity tolerance, Pain, Decreased mobility, Decreased strength, Postural dysfunction  Visit Diagnosis: 1. Chronic right-sided low back pain with right-sided sciatica   2. Muscle weakness (generalized)   3. Muscle spasm of back   4. Abnormal posture        Problem List Patient Active Problem List   Diagnosis Date Noted  . Chronic right-sided low back pain with right-sided sciatica 02/12/2019  . Gout 08/07/2016  . H/O seasonal allergies 08/07/2016  . Hyperlipidemia 08/07/2016  . Chronic pain of both knees 08/07/2016  . Erectile dysfunction 08/05/2016  . Gastroesophageal reflux disease 08/05/2016    Donise Woodle, PTA 04/03/2019, 12:26 PM  [facility] [address], [place], [place], [address] Phone: [phone]   Fax:   [phone]  Name: Stanley Bennett MRN: [id number] Date of Birth: July 14, 1956

## 2019-04-08 ENCOUNTER — Other Ambulatory Visit: Payer: Self-pay

## 2019-04-08 ENCOUNTER — Encounter: Payer: Self-pay | Admitting: Physical Therapy

## 2019-04-08 ENCOUNTER — Ambulatory Visit: Payer: BC Managed Care – PPO | Admitting: Physical Therapy

## 2019-04-08 DIAGNOSIS — M6281 Muscle weakness (generalized): Secondary | ICD-10-CM | POA: Diagnosis not present

## 2019-04-08 DIAGNOSIS — M5441 Lumbago with sciatica, right side: Secondary | ICD-10-CM | POA: Diagnosis not present

## 2019-04-08 DIAGNOSIS — R293 Abnormal posture: Secondary | ICD-10-CM | POA: Diagnosis not present

## 2019-04-08 DIAGNOSIS — G8929 Other chronic pain: Secondary | ICD-10-CM

## 2019-04-08 DIAGNOSIS — M62830 Muscle spasm of back: Secondary | ICD-10-CM | POA: Diagnosis not present

## 2019-04-08 NOTE — Therapy (Signed)
[facility] [address], [place], [place], [address] Phone: [phone]   Fax:  [phone]  Physical Therapy Treatment  Patient Details  Name: Stanley Bennett MRN: [id number] Date of Birth: 04-05-1956 Referring Provider (PT): Dr. Joni Fears   Encounter Date: 04/08/2019  PT End of Session - 04/08/19 1223    Visit Number  13    Date for PT Re-Evaluation  04/10/19    Authorization Type  BCBS    PT Start Time  1220    PT Stop Time  1259    PT Time Calculation (min)  39 min    Activity Tolerance  Patient tolerated treatment well    Behavior During Therapy  [facility] for tasks assessed/performed       Past Medical History:  Diagnosis Date  . Allergy   . Basal cell carcinoma    scalp  . Gout   . Hyperlipidemia     Past Surgical History:  Procedure Laterality Date  . APPENDECTOMY    . BASAL CELL CARCINOMA EXCISION    . CERVICAL DISC SURGERY    . TONSILLECTOMY      There were no vitals filed for this visit.  Subjective Assessment - 04/08/19 1224    Subjective  Was able to play some golf over the weekend. Pain mostly at night, low level today.    Currently in Pain?  Yes    Pain Score  3     Pain Location  Back    Pain Orientation  Right    Aggravating Factors   at night    Pain Relieving Factors  getting more flexible                       [facility] Adult PT Treatment/Exercise - 04/08/19 0001      Lumbar Exercises: Stretches   Other Lumbar Stretch Exercise  Hip ER release F/B static stretch    Other Lumbar Stretch Exercise  Hand to Big Toe yoga series with strap for RT hip flexibility      Lumbar Exercises: Aerobic   Elliptical  R7 L6 x 8 min discuss status      Lumbar Exercises: Standing   Side Lunge  15 reps   2 sets, holding 5# wts   Wall Slides  --   Upside down BOSU static squat 2x 30 sec   Other Standing Lumbar Exercises  Step ups on BOSU ball 20x RTLE    Other Standing  Lumbar Exercises  stand dead lift with 3# on right with tactile cues to keep back extended and using gluteals, 2x 15 had pt go lower to stool  today      Lumbar Exercises: Quadruped   Other Quadruped Lumbar Exercises  RTLE hip extension with bent knee , donkey kick RTLE 2 sets each   VC to bring RT pelvis down              PT Short Term Goals - 03/18/19 1111      PT SHORT TERM GOAL #3   Title  when sit for a period of time will limp 25% less    Time  4    Period  Weeks    Status  Achieved   40%-50%       PT Long Term Goals - 03/25/19 1206      PT LONG TERM GOAL #1   Title  The patient will be indep with HEP and understand how  to progress himself    Time  8    Period  Weeks    Status  On-going      PT LONG TERM GOAL #2   Title  able to sit for 30 minutes and get up to walk without a limp due to improved mobility and decreased pain    Baseline  limp improved by 50%    Time  8    Period  Weeks    Status  On-going      PT LONG TERM GOAL #3   Title  Pain after walking decreased >/= 50% due to improved mobility    Baseline  decreased by 40%    Time  8    Period  Weeks    Status  On-going      PT LONG TERM GOAL #4   Title  after waking up in the morning pain decreased >/= 50% with less of a limp on the right    Time  8    Period  Weeks    Status  On-going      PT LONG TERM GOAL #5   Title  FOTO score </= 27% limitation    Time  8    Period  Weeks    Status  New            Plan - 04/08/19 1223    Clinical Impression Statement  Pt reports he continues to have the same intermittent RT hip pain that will radiate to the knee. This pain does not seem to interfer with any ADLS at this time. Rt hip contiues to be weak in his glutes. Added quadruped hip extension with straight knee and bent knee to isolate his glute and work on his ROM at the same time. No pain increases during our session today.    Personal Factors and Comorbidities  Time since onset of  injury/illness/exacerbation    Examination-Activity Limitations  Locomotion Level;Bend;Stand    Rehab Potential  Excellent    PT Frequency  2x / week    PT Duration  8 weeks    PT Treatment/Interventions  ADLs/Self Care Home Management;Cryotherapy;Electrical Stimulation;Therapeutic activities;Therapeutic exercise;Neuromuscular re-education;Patient/family education;Manual techniques;Passive range of motion;Dry needling;Spinal Manipulations;Joint Manipulations    PT Next Visit Plan  PT to assess next session. Goals, ROM and strength    PT Home Exercise Plan  Access Code: [email or web address]    Consulted and Agree with Plan of Care  Patient       Patient will benefit from skilled therapeutic intervention in order to improve the following deficits and impairments:  Abnormal gait, Decreased range of motion, Increased fascial restricitons, Increased muscle spasms, Decreased activity tolerance, Pain, Decreased mobility, Decreased strength, Postural dysfunction  Visit Diagnosis: Chronic right-sided low back pain with right-sided sciatica  Muscle weakness (generalized)  Muscle spasm of back  Abnormal posture     Problem List Patient Active Problem List   Diagnosis Date Noted  . Chronic right-sided low back pain with right-sided sciatica 02/12/2019  . Gout 08/07/2016  . H/O seasonal allergies 08/07/2016  . Hyperlipidemia 08/07/2016  . Chronic pain of both knees 08/07/2016  . Erectile dysfunction 08/05/2016  . Gastroesophageal reflux disease 08/05/2016    [name], PTA 04/08/2019, 1:03 PM  [facility] [address], [place], [place], [address] Phone: [phone]   Fax:  [phone]  Name: Stanley Bennett MRN: [id number] Date of Birth: Jan 01, 1956  Access Code: [email or web address]  URL: [email or web address]  Date: 04/08/2019  Prepared by: Myrene Galas   Exercises  Supine Figure 4 Piriformis Stretch - 2  reps - 1 sets - 30 sec hold - 1x daily - 7x weekly  Supine Single Knee to Chest Stretch - 2 reps - 1 sets - 30 sec hold - 1x daily - 7x weekly  Half Kneeling Hip Flexor Stretch with Tri-Planar Reach - 2 reps - 1 sets - 30 sec hold - 1x daily - 7x weekly  Standing 'L' Stretch at Counter - 1 reps - 1 sets - 30 sec hold - 1x daily - 7x weekly  Prone Hip Flexor Stretch on Table with Strap - 2 reps - 1 sets - 30 sec hold - 1x daily - 7x weekly  Supine Hamstring Stretch with Strap - 2 reps - 1 sets - 30 sec hold - 1x daily - 7x weekly  Supine Piriformis Stretch with Residual Limb (AKA) - 2 reps - 1 sets - 30 sec hold - 1x daily - 7x weekly  Bird Dog - 10 reps - 2 sets - 1x daily - 7x weekly  Sidelying Hip Circles - 10 reps - 2 sets - 1x daily - 7x weekly  Quadruped Bent Leg Hip Extension - 10 reps - 2 sets - 1x daily - 7x weekly  Beginner Front Arm Support - 10 reps - 2 sets - 1x daily - 7x weekly  Patient Education  Trigger Point Dry Needling

## 2019-04-10 ENCOUNTER — Ambulatory Visit: Payer: BC Managed Care – PPO | Admitting: Physical Therapy

## 2019-04-10 ENCOUNTER — Other Ambulatory Visit: Payer: Self-pay

## 2019-04-10 ENCOUNTER — Encounter: Payer: Self-pay | Admitting: Physical Therapy

## 2019-04-10 DIAGNOSIS — R293 Abnormal posture: Secondary | ICD-10-CM | POA: Diagnosis not present

## 2019-04-10 DIAGNOSIS — M62830 Muscle spasm of back: Secondary | ICD-10-CM

## 2019-04-10 DIAGNOSIS — G8929 Other chronic pain: Secondary | ICD-10-CM

## 2019-04-10 DIAGNOSIS — J3089 Other allergic rhinitis: Secondary | ICD-10-CM | POA: Diagnosis not present

## 2019-04-10 DIAGNOSIS — M6281 Muscle weakness (generalized): Secondary | ICD-10-CM | POA: Diagnosis not present

## 2019-04-10 DIAGNOSIS — J3081 Allergic rhinitis due to animal (cat) (dog) hair and dander: Secondary | ICD-10-CM | POA: Diagnosis not present

## 2019-04-10 DIAGNOSIS — M5441 Lumbago with sciatica, right side: Secondary | ICD-10-CM | POA: Diagnosis not present

## 2019-04-10 DIAGNOSIS — J301 Allergic rhinitis due to pollen: Secondary | ICD-10-CM | POA: Diagnosis not present

## 2019-04-10 NOTE — Therapy (Signed)
[facility] [address], [address] , [place], [address] Phone: [phone]   Fax:  [phone]  Physical Therapy Treatment  Patient Details  Name: Stanley Bennett MRN: [id number] Date of Birth: 12/30/1955 Referring Provider (PT): Dr. Peter Whitfield   Encounter Date: 04/10/2019  PT End of Session - 04/10/19 1205    Visit Number  14    Date for PT Re-Evaluation  04/10/19    Authorization Type  BC[employer]    PT Start Time  1200    PT Stop Time  1230    PT Time Calculation (min)  30 min    Activity Tolerance  Patient tolerated treatment well    Behavior During Therapy  WFL for tasks assessed/performed       Past Medical History:  Diagnosis Date  . Allergy   . Basal cell carcinoma    scalp  . Gout   . Hyperlipidemia     Past Surgical History:  Procedure Laterality Date  . APPENDECTOMY    . BASAL CELL CARCINOMA EXCISION    . CERVICAL DISC SURGERY    . TONSILLECTOMY      There were no vitals filed for this visit.  Subjective Assessment - 04/10/19 1202    Subjective  I am more flexibility. When the pain hits it is intense but not as frequent. Overall I am better.    Patient Stated Goals  reduce pain    Currently in Pain?  Yes    Pain Score  3    night time is 6-7/10   Pain Location  Back    Pain Orientation  Right    Pain Descriptors / Indicators  Aching;Radiating    Pain Type  Chronic pain    Pain Radiating Towards  radiates into the right groin    Pain Frequency  Intermittent    Aggravating Factors   at night    Pain Relieving Factors  getting more flexible    Multiple Pain Sites  No         [facility] PT Assessment - 04/10/19 0001      Assessment   Medical Diagnosis  M54.41, G89.29 Chronic right-sided low back pain with right-sided sciatica    Referring Provider (PT)  Dr. Peter Whitfield      Prior Function   Level of Independence  Independent    Vocation  Full time employment    Vocation  Requirements  stand and sit at computer    Leisure  walk 7-8 miles per day; golf      Cognition   Overall Cognitive Status  Within Functional Limits for tasks assessed      Observation/Other Assessments   Focus on Therapeutic Outcomes (FOTO)   28% limitation      AROM   Lumbar Flexion  full    Lumbar Extension  full    Lumbar - Right Side Bend  full    Lumbar - Left Side Bend  full    Lumbar - Right Rotation  full    Lumbar - Left Rotation  full      PROM   Right Hip External Rotation   55      Strength   Right Hip Extension  4/5    Right Hip ADduction  5/5    Left Hip Extension  5/5    Left Hip ABduction  5/5    Left Hip ADduction  5/5    Right Knee Flexion  4+/5                     [facility] Adult PT Treatment/Exercise - 04/10/19 0001      Lumbar Exercises: Stretches   Piriformis Stretch  Right;Left;2 reps;30 seconds   supine     Lumbar Exercises: Aerobic   Elliptical  R7 L6 x 8 min discuss status      Lumbar Exercises: Quadruped   Opposite Arm/Leg Raise  Right arm/Left leg;Left arm/Right leg;15 reps;1 second    Other Quadruped Lumbar Exercises  RTLE hip extension with bent knee , donkey kick RTLE 2 sets each   VC to bring RT pelvis down     Knee/Hip Exercises: Sidelying   Other Sidelying Knee/Hip Exercises  LT S/L leg circles 10x i neach direction. PTA stabilized pelvis, Vc for > hip extension.   added to HEP            PT Education - 04/10/19 1233    Education Details  Access Code: [email or web address]    Person(s) Educated  Patient    Methods  Explanation;Demonstration;Verbal cues;Handout    Comprehension  Verbalized understanding;Returned demonstration       PT Short Term Goals - 03/18/19 1111      PT SHORT TERM GOAL #3   Title  when sit for a period of time will limp 25% less    Time  4    Period  Weeks    Status  Achieved   40%-50%       PT Long Term Goals - 04/10/19 1236      PT LONG TERM GOAL #1   Title  The patient will be indep with  HEP and understand how to progress himself    Time  8    Period  Weeks    Status  Achieved      PT LONG TERM GOAL #2   Title  able to sit for 30 minutes and get up to walk without a limp due to improved mobility and decreased pain    Time  8    Period  Weeks    Status  Achieved      PT LONG TERM GOAL #3   Title  Pain after walking decreased >/= 50% due to improved mobility    Time  8    Period  Weeks    Status  Achieved      PT LONG TERM GOAL #4   Title  after waking up in the morning pain decreased >/= 50% with less of a limp on the right    Time  8    Period  Weeks    Status  Achieved      PT LONG TERM GOAL #5   Title  FOTO score </= 27% limitation    Baseline  28%    Time  8    Period  Weeks    Status  Not Met            Plan - 04/10/19 1216    Clinical Impression Statement  Patient reports his pain is 50% better. Pain level during the day is 3/10 but at night he will get a sharp pain that is 6/10. Patient is more flexible. Patient left hip ER increased to 55 degrees. Patient has full strength with exception of right hip extension 4+/5 and right knee flexion 4+/5. Patient has full lumbar ROM. Patient has met his goals.    Personal Factors and Comorbidities  Time since onset of injury/illness/exacerbation    Examination-Activity Limitations  Locomotion Level;Bend;Stand    Stability/Clinical Decision Making  Evolving/Moderate complexity      Rehab Potential  Excellent    PT Frequency  2x / week    PT Duration  8 weeks    PT Treatment/Interventions  ADLs/Self Care Home Management;Cryotherapy;Electrical Stimulation;Therapeutic activities;Therapeutic exercise;Neuromuscular re-education;Patient/family education;Manual techniques;Passive range of motion;Dry needling;Spinal Manipulations;Joint Manipulations    PT Next Visit Plan  Discharge to HEP    PT Home Exercise Plan  Access Code: [email or web address]    Consulted and Agree with Plan of Care  Patient       Patient will benefit  from skilled therapeutic intervention in order to improve the following deficits and impairments:  Abnormal gait, Decreased range of motion, Increased fascial restricitons, Increased muscle spasms, Decreased activity tolerance, Pain, Decreased mobility, Decreased strength, Postural dysfunction  Visit Diagnosis: Chronic right-sided low back pain with right-sided sciatica  Muscle weakness (generalized)  Muscle spasm of back  Abnormal posture     Problem List Patient Active Problem List   Diagnosis Date Noted  . Chronic right-sided low back pain with right-sided sciatica 02/12/2019  . Gout 08/07/2016  . H/O seasonal allergies 08/07/2016  . Hyperlipidemia 08/07/2016  . Chronic pain of both knees 08/07/2016  . Erectile dysfunction 08/05/2016  . Gastroesophageal reflux disease 08/05/2016    Cheryl Gray, PT 04/10/19 12:42 PM   [facility] [address], [address] [place], [place], [address] Phone: [phone]   Fax:  [phone]  Name: Stanley Bennett MRN: [id number] Date of Birth: 03/16/1956  PHYSICAL THERAPY DISCHARGE SUMMARY  Visits from Start of Care: 14  Current functional level related to goals / functional outcomes: See above.    Remaining deficits: See above.    Education / Equipment: HEP Plan: Patient agrees to discharge.  Patient goals were met. Patient is being discharged due to meeting the stated rehab goals.  Thank you for the referral. Cheryl Gray, PT 04/10/19 12:43 PM  ?????      

## 2019-04-10 NOTE — Patient Instructions (Signed)
Access Code: [email or web address]  URL: [email or web address]  Date: 04/10/2019  Prepared by: Earlie Counts   Exercises Supine Single Knee to Chest Stretch - 2 reps - 1 sets - 30 sec hold                            - 1x daily - 7x weekly Half Kneeling Hip Flexor Stretch with Tri-Planar Reach - 2 reps - 1 sets - 30 sec hold - 1x daily - 7x weekly Standing 'L' Stretch at Counter - 1 reps - 1 sets - 30 sec hold - 1x daily - 7x weekly Prone Hip Flexor Stretch on Table with Strap - 2 reps - 1 sets - 30 sec hold - 1x daily - 7x weekly Supine Hamstring Stretch with Strap - 2 reps - 1 sets - 30 sec hold - 1x daily - 7x weekly Bird Dog - 10 reps - 2 sets - 1x daily - 7x weekly Sidelying Hip Circles - 10 reps - 2 sets - 1x daily - 7x weekly Quadruped Bent Leg Hip Extension - 10 reps - 2 sets - 1x daily - 7x weekly Supine Piriformis Stretch - 2 reps - 1 sets - 30 sec hold - 1x daily - 7x weekly The Diver - 10 reps - 1 sets - 1x daily - 7x weekly Quadruped Hip Abduction and External Rotation - 10 reps - 1 sets                            - 1x daily - 7x weekly Quadruped Hip Extension Kicks - 10 reps - 1 sets - 1x daily - 7x weekly Patient Education Trigger [facility] [address], [place], [place] [address] Phone # [phone] Fax [phone]

## 2019-04-11 DIAGNOSIS — J3081 Allergic rhinitis due to animal (cat) (dog) hair and dander: Secondary | ICD-10-CM | POA: Diagnosis not present

## 2019-04-11 DIAGNOSIS — J3089 Other allergic rhinitis: Secondary | ICD-10-CM | POA: Diagnosis not present

## 2019-04-11 DIAGNOSIS — J301 Allergic rhinitis due to pollen: Secondary | ICD-10-CM | POA: Diagnosis not present

## 2019-04-18 DIAGNOSIS — J301 Allergic rhinitis due to pollen: Secondary | ICD-10-CM | POA: Diagnosis not present

## 2019-04-18 DIAGNOSIS — J3089 Other allergic rhinitis: Secondary | ICD-10-CM | POA: Diagnosis not present

## 2019-04-26 DIAGNOSIS — J301 Allergic rhinitis due to pollen: Secondary | ICD-10-CM | POA: Diagnosis not present

## 2019-04-26 DIAGNOSIS — J3081 Allergic rhinitis due to animal (cat) (dog) hair and dander: Secondary | ICD-10-CM | POA: Diagnosis not present

## 2019-04-26 DIAGNOSIS — J3089 Other allergic rhinitis: Secondary | ICD-10-CM | POA: Diagnosis not present

## 2019-05-01 DIAGNOSIS — J3089 Other allergic rhinitis: Secondary | ICD-10-CM | POA: Diagnosis not present

## 2019-05-01 DIAGNOSIS — J3081 Allergic rhinitis due to animal (cat) (dog) hair and dander: Secondary | ICD-10-CM | POA: Diagnosis not present

## 2019-05-01 DIAGNOSIS — J301 Allergic rhinitis due to pollen: Secondary | ICD-10-CM | POA: Diagnosis not present

## 2019-05-10 DIAGNOSIS — J301 Allergic rhinitis due to pollen: Secondary | ICD-10-CM | POA: Diagnosis not present

## 2019-05-10 DIAGNOSIS — J3081 Allergic rhinitis due to animal (cat) (dog) hair and dander: Secondary | ICD-10-CM | POA: Diagnosis not present

## 2019-05-10 DIAGNOSIS — J3089 Other allergic rhinitis: Secondary | ICD-10-CM | POA: Diagnosis not present

## 2019-05-14 ENCOUNTER — Encounter: Payer: Self-pay | Admitting: Family Medicine

## 2019-05-14 DIAGNOSIS — R3912 Poor urinary stream: Secondary | ICD-10-CM

## 2019-05-14 DIAGNOSIS — N401 Enlarged prostate with lower urinary tract symptoms: Secondary | ICD-10-CM

## 2019-05-14 MED ORDER — TADALAFIL 5 MG PO TABS: 5.0000 mg | ORAL_TABLET | Freq: Every day | ORAL | 0 refills | Status: DC

## 2019-05-15 ENCOUNTER — Other Ambulatory Visit: Payer: Self-pay | Admitting: Family Medicine

## 2019-05-16 DIAGNOSIS — J301 Allergic rhinitis due to pollen: Secondary | ICD-10-CM | POA: Diagnosis not present

## 2019-05-16 DIAGNOSIS — J3089 Other allergic rhinitis: Secondary | ICD-10-CM | POA: Diagnosis not present

## 2019-05-16 DIAGNOSIS — J3081 Allergic rhinitis due to animal (cat) (dog) hair and dander: Secondary | ICD-10-CM | POA: Diagnosis not present

## 2019-05-22 DIAGNOSIS — J301 Allergic rhinitis due to pollen: Secondary | ICD-10-CM | POA: Diagnosis not present

## 2019-05-22 DIAGNOSIS — J3089 Other allergic rhinitis: Secondary | ICD-10-CM | POA: Diagnosis not present

## 2019-05-22 DIAGNOSIS — J3081 Allergic rhinitis due to animal (cat) (dog) hair and dander: Secondary | ICD-10-CM | POA: Diagnosis not present

## 2019-05-24 DIAGNOSIS — J301 Allergic rhinitis due to pollen: Secondary | ICD-10-CM | POA: Diagnosis not present

## 2019-05-24 DIAGNOSIS — J3081 Allergic rhinitis due to animal (cat) (dog) hair and dander: Secondary | ICD-10-CM | POA: Diagnosis not present

## 2019-05-24 DIAGNOSIS — J3089 Other allergic rhinitis: Secondary | ICD-10-CM | POA: Diagnosis not present

## 2019-06-04 DIAGNOSIS — J301 Allergic rhinitis due to pollen: Secondary | ICD-10-CM | POA: Diagnosis not present

## 2019-06-04 DIAGNOSIS — J3081 Allergic rhinitis due to animal (cat) (dog) hair and dander: Secondary | ICD-10-CM | POA: Diagnosis not present

## 2019-06-04 DIAGNOSIS — J3089 Other allergic rhinitis: Secondary | ICD-10-CM | POA: Diagnosis not present

## 2019-06-10 DIAGNOSIS — J3089 Other allergic rhinitis: Secondary | ICD-10-CM | POA: Diagnosis not present

## 2019-06-10 DIAGNOSIS — J3081 Allergic rhinitis due to animal (cat) (dog) hair and dander: Secondary | ICD-10-CM | POA: Diagnosis not present

## 2019-06-10 DIAGNOSIS — J301 Allergic rhinitis due to pollen: Secondary | ICD-10-CM | POA: Diagnosis not present

## 2019-06-18 DIAGNOSIS — J301 Allergic rhinitis due to pollen: Secondary | ICD-10-CM | POA: Diagnosis not present

## 2019-06-18 DIAGNOSIS — J3089 Other allergic rhinitis: Secondary | ICD-10-CM | POA: Diagnosis not present

## 2019-06-18 DIAGNOSIS — J3081 Allergic rhinitis due to animal (cat) (dog) hair and dander: Secondary | ICD-10-CM | POA: Diagnosis not present

## 2019-06-19 ENCOUNTER — Other Ambulatory Visit: Payer: Self-pay | Admitting: Family Medicine

## 2019-06-25 DIAGNOSIS — J3081 Allergic rhinitis due to animal (cat) (dog) hair and dander: Secondary | ICD-10-CM | POA: Diagnosis not present

## 2019-06-25 DIAGNOSIS — J3089 Other allergic rhinitis: Secondary | ICD-10-CM | POA: Diagnosis not present

## 2019-06-25 DIAGNOSIS — J301 Allergic rhinitis due to pollen: Secondary | ICD-10-CM | POA: Diagnosis not present

## 2019-06-27 DIAGNOSIS — J301 Allergic rhinitis due to pollen: Secondary | ICD-10-CM | POA: Diagnosis not present

## 2019-06-27 DIAGNOSIS — J3081 Allergic rhinitis due to animal (cat) (dog) hair and dander: Secondary | ICD-10-CM | POA: Diagnosis not present

## 2019-06-27 DIAGNOSIS — J3089 Other allergic rhinitis: Secondary | ICD-10-CM | POA: Diagnosis not present

## 2019-07-02 DIAGNOSIS — J3089 Other allergic rhinitis: Secondary | ICD-10-CM | POA: Diagnosis not present

## 2019-07-02 DIAGNOSIS — J3081 Allergic rhinitis due to animal (cat) (dog) hair and dander: Secondary | ICD-10-CM | POA: Diagnosis not present

## 2019-07-02 DIAGNOSIS — J301 Allergic rhinitis due to pollen: Secondary | ICD-10-CM | POA: Diagnosis not present

## 2019-07-08 DIAGNOSIS — J301 Allergic rhinitis due to pollen: Secondary | ICD-10-CM | POA: Diagnosis not present

## 2019-07-08 DIAGNOSIS — J3089 Other allergic rhinitis: Secondary | ICD-10-CM | POA: Diagnosis not present

## 2019-07-08 DIAGNOSIS — J3081 Allergic rhinitis due to animal (cat) (dog) hair and dander: Secondary | ICD-10-CM | POA: Diagnosis not present

## 2019-07-24 DIAGNOSIS — J301 Allergic rhinitis due to pollen: Secondary | ICD-10-CM | POA: Diagnosis not present

## 2019-07-24 DIAGNOSIS — J3081 Allergic rhinitis due to animal (cat) (dog) hair and dander: Secondary | ICD-10-CM | POA: Diagnosis not present

## 2019-07-24 DIAGNOSIS — J3089 Other allergic rhinitis: Secondary | ICD-10-CM | POA: Diagnosis not present

## 2019-08-01 DIAGNOSIS — J301 Allergic rhinitis due to pollen: Secondary | ICD-10-CM | POA: Diagnosis not present

## 2019-08-01 DIAGNOSIS — J3081 Allergic rhinitis due to animal (cat) (dog) hair and dander: Secondary | ICD-10-CM | POA: Diagnosis not present

## 2019-08-01 DIAGNOSIS — J3089 Other allergic rhinitis: Secondary | ICD-10-CM | POA: Diagnosis not present

## 2019-08-05 DIAGNOSIS — J3089 Other allergic rhinitis: Secondary | ICD-10-CM | POA: Diagnosis not present

## 2019-08-05 DIAGNOSIS — J3081 Allergic rhinitis due to animal (cat) (dog) hair and dander: Secondary | ICD-10-CM | POA: Diagnosis not present

## 2019-08-05 DIAGNOSIS — J301 Allergic rhinitis due to pollen: Secondary | ICD-10-CM | POA: Diagnosis not present

## 2019-08-13 ENCOUNTER — Encounter: Payer: Self-pay | Admitting: Family Medicine

## 2019-08-13 DIAGNOSIS — J301 Allergic rhinitis due to pollen: Secondary | ICD-10-CM | POA: Diagnosis not present

## 2019-08-13 DIAGNOSIS — J3081 Allergic rhinitis due to animal (cat) (dog) hair and dander: Secondary | ICD-10-CM | POA: Diagnosis not present

## 2019-08-13 DIAGNOSIS — N401 Enlarged prostate with lower urinary tract symptoms: Secondary | ICD-10-CM

## 2019-08-13 DIAGNOSIS — J3089 Other allergic rhinitis: Secondary | ICD-10-CM | POA: Diagnosis not present

## 2019-08-13 MED ORDER — TADALAFIL 5 MG PO TABS: 5.0000 mg | ORAL_TABLET | Freq: Every day | ORAL | 0 refills | Status: DC

## 2019-08-15 DIAGNOSIS — Z03818 Encounter for observation for suspected exposure to other biological agents ruled out: Secondary | ICD-10-CM | POA: Diagnosis not present

## 2019-08-19 DIAGNOSIS — J301 Allergic rhinitis due to pollen: Secondary | ICD-10-CM | POA: Diagnosis not present

## 2019-08-19 DIAGNOSIS — J3089 Other allergic rhinitis: Secondary | ICD-10-CM | POA: Diagnosis not present

## 2019-08-19 DIAGNOSIS — J3081 Allergic rhinitis due to animal (cat) (dog) hair and dander: Secondary | ICD-10-CM | POA: Diagnosis not present

## 2019-08-26 DIAGNOSIS — J301 Allergic rhinitis due to pollen: Secondary | ICD-10-CM | POA: Diagnosis not present

## 2019-08-26 DIAGNOSIS — J3081 Allergic rhinitis due to animal (cat) (dog) hair and dander: Secondary | ICD-10-CM | POA: Diagnosis not present

## 2019-08-26 DIAGNOSIS — J3089 Other allergic rhinitis: Secondary | ICD-10-CM | POA: Diagnosis not present

## 2019-09-02 DIAGNOSIS — J3081 Allergic rhinitis due to animal (cat) (dog) hair and dander: Secondary | ICD-10-CM | POA: Diagnosis not present

## 2019-09-02 DIAGNOSIS — J3089 Other allergic rhinitis: Secondary | ICD-10-CM | POA: Diagnosis not present

## 2019-09-02 DIAGNOSIS — J301 Allergic rhinitis due to pollen: Secondary | ICD-10-CM | POA: Diagnosis not present

## 2019-09-04 ENCOUNTER — Ambulatory Visit: Payer: PPO | Attending: Internal Medicine

## 2019-09-04 DIAGNOSIS — Z23 Encounter for immunization: Secondary | ICD-10-CM | POA: Insufficient documentation

## 2019-09-04 NOTE — Progress Notes (Signed)
   Covid-19 Vaccination Clinic  Name:  Stanley Bennett    MRN: [id number] DOB: [birth year]  09/04/2019  [name] was observed post Covid-19 immunization for 15 minutes without incidence. He was provided with Vaccine Information Sheet and instruction to access the V-Safe system.   [name] was instructed to call 911 with any severe reactions post vaccine: Marland Kitchen Difficulty breathing  . Swelling of your face and throat  . A fast heartbeat  . A bad rash all over your body  . Dizziness and weakness    Immunizations Administered    Name Date Dose VIS Date Route   Pfizer COVID-19 Vaccine 09/04/2019  8:54 AM 0.3 mL 07/26/2019 Intramuscular   Manufacturer: [place]   Lot: [id number]   [place]: [id number]

## 2019-09-11 DIAGNOSIS — J301 Allergic rhinitis due to pollen: Secondary | ICD-10-CM | POA: Diagnosis not present

## 2019-09-11 DIAGNOSIS — J3081 Allergic rhinitis due to animal (cat) (dog) hair and dander: Secondary | ICD-10-CM | POA: Diagnosis not present

## 2019-09-11 DIAGNOSIS — J3089 Other allergic rhinitis: Secondary | ICD-10-CM | POA: Diagnosis not present

## 2019-09-19 DIAGNOSIS — J3089 Other allergic rhinitis: Secondary | ICD-10-CM | POA: Diagnosis not present

## 2019-09-19 DIAGNOSIS — J3081 Allergic rhinitis due to animal (cat) (dog) hair and dander: Secondary | ICD-10-CM | POA: Diagnosis not present

## 2019-09-19 DIAGNOSIS — J301 Allergic rhinitis due to pollen: Secondary | ICD-10-CM | POA: Diagnosis not present

## 2019-09-22 ENCOUNTER — Ambulatory Visit: Payer: PPO | Attending: Internal Medicine

## 2019-09-22 DIAGNOSIS — Z23 Encounter for immunization: Secondary | ICD-10-CM | POA: Insufficient documentation

## 2019-09-22 NOTE — Progress Notes (Signed)
   Covid-19 Vaccination Clinic  Name:  Stanley Bennett    MRN: [id number] DOB: 27-Jul-1956  09/22/2019  [name] was observed post Covid-19 immunization for 30 minutes based on pre-vaccination screening without incidence. He was provided with Vaccine Information Sheet and instruction to access the V-Safe system.   [name] was instructed to call 911 with any severe reactions post vaccine: Marland Kitchen Difficulty breathing  . Swelling of your face and throat  . A fast heartbeat  . A bad rash all over your body  . Dizziness and weakness    Immunizations Administered    Name Date Dose VIS Date Route   Pfizer COVID-19 Vaccine 09/22/2019 12:44 PM 0.3 mL 07/26/2019 Intramuscular   Manufacturer: [place]   Lot: [id number]   [place]: [id number]

## 2019-09-25 DIAGNOSIS — J301 Allergic rhinitis due to pollen: Secondary | ICD-10-CM | POA: Diagnosis not present

## 2019-09-25 DIAGNOSIS — J3089 Other allergic rhinitis: Secondary | ICD-10-CM | POA: Diagnosis not present

## 2019-09-25 DIAGNOSIS — J3081 Allergic rhinitis due to animal (cat) (dog) hair and dander: Secondary | ICD-10-CM | POA: Diagnosis not present

## 2019-10-01 DIAGNOSIS — J301 Allergic rhinitis due to pollen: Secondary | ICD-10-CM | POA: Diagnosis not present

## 2019-10-01 DIAGNOSIS — J3081 Allergic rhinitis due to animal (cat) (dog) hair and dander: Secondary | ICD-10-CM | POA: Diagnosis not present

## 2019-10-01 DIAGNOSIS — J3089 Other allergic rhinitis: Secondary | ICD-10-CM | POA: Diagnosis not present

## 2019-10-11 DIAGNOSIS — J3089 Other allergic rhinitis: Secondary | ICD-10-CM | POA: Diagnosis not present

## 2019-10-11 DIAGNOSIS — J301 Allergic rhinitis due to pollen: Secondary | ICD-10-CM | POA: Diagnosis not present

## 2019-10-11 DIAGNOSIS — J3081 Allergic rhinitis due to animal (cat) (dog) hair and dander: Secondary | ICD-10-CM | POA: Diagnosis not present

## 2019-10-16 DIAGNOSIS — J301 Allergic rhinitis due to pollen: Secondary | ICD-10-CM | POA: Diagnosis not present

## 2019-10-16 DIAGNOSIS — J3081 Allergic rhinitis due to animal (cat) (dog) hair and dander: Secondary | ICD-10-CM | POA: Diagnosis not present

## 2019-10-16 DIAGNOSIS — J3089 Other allergic rhinitis: Secondary | ICD-10-CM | POA: Diagnosis not present

## 2019-10-24 ENCOUNTER — Other Ambulatory Visit: Payer: Self-pay

## 2019-10-24 DIAGNOSIS — J301 Allergic rhinitis due to pollen: Secondary | ICD-10-CM | POA: Diagnosis not present

## 2019-10-24 DIAGNOSIS — J3081 Allergic rhinitis due to animal (cat) (dog) hair and dander: Secondary | ICD-10-CM | POA: Diagnosis not present

## 2019-10-24 DIAGNOSIS — J3089 Other allergic rhinitis: Secondary | ICD-10-CM | POA: Diagnosis not present

## 2019-10-25 ENCOUNTER — Ambulatory Visit (INDEPENDENT_AMBULATORY_CARE_PROVIDER_SITE_OTHER): Payer: PPO | Admitting: Family Medicine

## 2019-10-25 ENCOUNTER — Encounter: Payer: Self-pay | Admitting: Family Medicine

## 2019-10-25 ENCOUNTER — Other Ambulatory Visit: Payer: Self-pay

## 2019-10-25 VITALS — BP 108/78 | HR 60 | Temp 97.9°F | Ht 69.0 in | Wt 174.3 lb

## 2019-10-25 DIAGNOSIS — R000 Tachycardia, unspecified: Secondary | ICD-10-CM

## 2019-10-25 DIAGNOSIS — R0789 Other chest pain: Secondary | ICD-10-CM | POA: Diagnosis not present

## 2019-10-25 LAB — BASIC METABOLIC PANEL
BUN: 16 mg/dL (ref 6–23)
CO2: 25 mEq/L (ref 19–32)
Calcium: 9.2 mg/dL (ref 8.4–10.5)
Chloride: 104 mEq/L (ref 96–112)
Creatinine, Ser: 0.92 mg/dL (ref 0.40–1.50)
GFR: 82.46 mL/min (ref >60.00)
Glucose, Bld: 62 mg/dL — ABNORMAL LOW (ref 70–99)
Potassium: 4.5 mEq/L (ref 3.5–5.1)
Sodium: 139 mEq/L (ref 135–145)

## 2019-10-25 LAB — TSH: TSH: 3.27 u[IU]/mL (ref 0.35–4.50)

## 2019-10-25 LAB — MAGNESIUM: Magnesium: 2 mg/dL (ref 1.5–2.5)

## 2019-10-25 NOTE — Patient Instructions (Signed)

## 2019-10-25 NOTE — Progress Notes (Signed)
Subjective:     Patient ID: Stanley Bennett, male   DOB: [birth year], 64 y.o.   MRN: [id number]  HPI   Mr. Melchert was seen with some vague chest symptoms.  He states he has had 2 episodes over the past month where he at rest had heart rate up to 119 or so and his usual resting heart rate is in the 50s to even upper 40s.  Symptoms lasted about an hour.  No known history of atrial fibrillation or other arrhythmia.  He did not have any associated chest pain with those episodes.  This occurred at rest.  He does have history of GERD which has been fairly well controlled with Protonix.  He has had some intermittent pressure center of chest and sometimes relieved with burping or belching.  He walks about a hour and a half and sometimes more per day fairly briskly and has not had any chest pain with that.  No recent dizziness or syncope.  His father had CABG at age 79 and he had a sister that recently had coronary disease at age 50.  Patient is non-smoker.  No history of diabetes.  He has hyperlipidemia treated with simvastatin.  Past Medical History:  Diagnosis Date  . Allergy   . Basal cell carcinoma    scalp  . Gout   . Hyperlipidemia    Past Surgical History:  Procedure Laterality Date  . APPENDECTOMY    . BASAL CELL CARCINOMA EXCISION    . CERVICAL DISC SURGERY    . TONSILLECTOMY      reports that he has never smoked. He has never used smokeless tobacco. He reports current alcohol use of about 6.0 standard drinks of alcohol per week. He reports that he does not use drugs. family history includes Arthritis in his father and mother; Cancer in his sister; Diabetes in his sister; Heart disease in his father; Kidney failure in his cousin and cousin; Other in his brother. Allergies  Allergen Reactions  . Tamiflu [Oseltamivir] Rash    Mild rash 2018     Review of Systems  Constitutional: Negative for appetite change, chills, fatigue, fever and unexpected weight change.  HENT:  Negative for trouble swallowing.   Eyes: Negative for visual disturbance.  Respiratory: Negative for cough and shortness of breath.   Cardiovascular: Positive for palpitations. Negative for leg swelling.  Gastrointestinal: Negative for abdominal pain.  Neurological: Negative for dizziness, syncope, weakness, light-headedness and headaches.       Objective:   Physical Exam Constitutional:      Appearance: He is well-developed.  HENT:     Right Ear: External ear normal.     Left Ear: External ear normal.  Eyes:     Pupils: Pupils are equal, round, and reactive to light.  Neck:     Thyroid: No thyromegaly.  Cardiovascular:     Rate and Rhythm: Normal rate and regular rhythm.  Pulmonary:     Effort: Pulmonary effort is normal. No respiratory distress.     Breath sounds: Normal breath sounds. No wheezing or rales.  Abdominal:     Palpations: Abdomen is soft.     Tenderness: There is no abdominal tenderness.  Musculoskeletal:     Cervical back: Neck supple.  Neurological:     Mental Status: He is alert and oriented to person, place, and time.        Assessment:     Patient presents with approximately 1 month history of vague chest symptoms.  These  have never been exertional.  He is describing 2 separate episodes of increased tachypalpitations that lasted about an hour.  Does have family history of CAD as above. ?intermittent A fib.    Plan:     -Check EKG= sinus bradycardia with no acute ST-T changes -Even though he is not having any exertional chest symptoms he has had describing some intermittent chest pressure with no clear etiology.  Given family history as above recommend further cardiac work-up and especially in view of his intermittent tachycardia.  Will likely need event monitor and consideration for further testing to rule out CAD -Cardiology consult has been placed -Follow-up immediately for any exertional chest pressure or other concerns -We will check basic  metabolic panel, magnesium, TSH in view of his recent transient tachycardia   Eulas Post MD [facility]

## 2019-10-28 DIAGNOSIS — J3089 Other allergic rhinitis: Secondary | ICD-10-CM | POA: Diagnosis not present

## 2019-10-28 DIAGNOSIS — J3081 Allergic rhinitis due to animal (cat) (dog) hair and dander: Secondary | ICD-10-CM | POA: Diagnosis not present

## 2019-10-28 DIAGNOSIS — J301 Allergic rhinitis due to pollen: Secondary | ICD-10-CM | POA: Diagnosis not present

## 2019-10-29 ENCOUNTER — Encounter: Payer: Self-pay | Admitting: Cardiology

## 2019-10-29 ENCOUNTER — Ambulatory Visit: Payer: PPO | Admitting: Cardiology

## 2019-10-29 ENCOUNTER — Encounter: Payer: Self-pay | Admitting: *Deleted

## 2019-10-29 ENCOUNTER — Other Ambulatory Visit: Payer: Self-pay

## 2019-10-29 VITALS — BP 108/70 | HR 53 | Temp 97.6°F | Ht 69.0 in | Wt 175.0 lb

## 2019-10-29 DIAGNOSIS — E785 Hyperlipidemia, unspecified: Secondary | ICD-10-CM

## 2019-10-29 DIAGNOSIS — R072 Precordial pain: Secondary | ICD-10-CM | POA: Diagnosis not present

## 2019-10-29 DIAGNOSIS — R000 Tachycardia, unspecified: Secondary | ICD-10-CM

## 2019-10-29 DIAGNOSIS — Z7189 Other specified counseling: Secondary | ICD-10-CM

## 2019-10-29 DIAGNOSIS — Z8249 Family history of ischemic heart disease and other diseases of the circulatory system: Secondary | ICD-10-CM | POA: Diagnosis not present

## 2019-10-29 NOTE — Progress Notes (Signed)
Patient ID: Stanley Bennett, male   DOB: 05-24-1956, 64 y.o.   MRN: [id number] Patient enrolled for [employer] to mail a 14 day Zio  XT long term holter monitor to his home.

## 2019-10-29 NOTE — Patient Instructions (Addendum)
Medication Instructions:  Your Physician recommend you continue on your current medication as directed.    *If you need a refill on your cardiac medications before your next appointment, please call your pharmacy*   Lab Work: None   Testing/Procedures: Cardiac CT Angiography (CTA), is a special type of CT scan that uses a computer to produce multi-dimensional views of major blood vessels throughout the body. In CT angiography, a contrast material is injected through an IV to help visualize the blood vessels  Our physician has recommended that you wear an 14 DAY ZIO-PATCH monitor. The Zio patch cardiac monitor continuously records heart rhythm data for up to 14 days, this is for patients being evaluated for multiple types heart rhythms. For the first 24 hours post application, please avoid getting the Zio monitor wet in the shower or by excessive sweating during exercise. After that, feel free to carry on with regular activities. Keep soaps and lotions away from the ZIO XT Patch.   Someone will call to mail monitor.        Follow-Up: At [facility], you and your health needs are our priority.  As part of our continuing mission to provide you with exceptional heart care, we have created designated Provider Care Teams.  These Care Teams include your primary Cardiologist (physician) and Advanced Practice Providers (APPs -  Physician Assistants and Nurse Practitioners) who all work together to provide you with the care you need, when you need it.  We recommend signing up for the patient portal called "MyChart".  Sign up information is provided on this After Visit Summary.  MyChart is used to connect with patients for Virtual Visits (Telemedicine).  Patients are able to view lab/test results, encounter notes, upcoming appointments, etc.  Non-urgent messages can be sent to your provider as well.   To learn more about what you can do with MyChart, go to [email or web address].    Your next  appointment:   2 year(s)  The format for your next appointment:   In Person  Provider:   Buford Dresser, MD  Your cardiac CT will be scheduled at one of the below locations:   [facility] [address] [place], [place] [address] [phone]  If scheduled at [facility], please arrive at the [facility] main entrance of [facility] 30 minutes prior to test start time. Proceed to the [facility] Radiology Department (first floor) to check-in and test prep.  If scheduled at [facility], please arrive 15 mins early for check-in and test prep.  Please follow these instructions carefully (unless otherwise directed):  Hold all erectile dysfunction medications at least 3 days (72 hrs) prior to test. Tadalafil   On the Night Before the Test: . Be sure to Drink plenty of water. . Do not consume any caffeinated/decaffeinated beverages or chocolate 12 hours prior to your test. . Do not take any antihistamines 12 hours prior to your test.   On the Day of the Test: . Drink plenty of water. Do not drink any water within one hour of the test. . Do not eat any food 4 hours prior to the test. . You may take your regular medications prior to the test.        After the Test: . Drink plenty of water. . After receiving IV contrast, you may experience a mild flushed feeling. This is normal. . On occasion, you may experience a mild rash up to 24 hours after the test.  This is not dangerous. If this occurs, you can take Benadryl 25 mg and increase your fluid intake. . If you experience trouble breathing, this can be serious. If it is severe call 911 IMMEDIATELY. If it is mild, please call our office.   Once we have confirmed authorization from your insurance company, we will call you to set up a date and time for your test.   For non-scheduling related questions, please contact the cardiac imaging nurse navigator should you have any  questions/concerns: Marchia Bond, RN Navigator Cardiac Imaging [facility] [phone] office  For scheduling needs, including cancellations and rescheduling, please call [phone].   ZIO XT- Long Term Monitor Instructions   Your physician has requested you wear your ZIO patch monitor___14____days.   This is a single patch monitor.  [employer] supplies one patch monitor per enrollment.  Additional stickers are not available.   Please do not apply patch if you will be having a Nuclear Stress Test, Echocardiogram, Cardiac CT, MRI, or Chest Xray during the time frame you would be wearing the monitor. The patch cannot be worn during these tests.  You cannot remove and re-apply the ZIO XT patch monitor.   Your ZIO patch monitor will be sent USPS Priority mail from [facility] directly to your home address. The monitor may also be mailed to a PO BOX if home delivery is not available.   It may take 3-5 days to receive your monitor after you have been enrolled.   Once you have received you monitor, please review enclosed instructions.  Your monitor has already been registered assigning a specific monitor serial # to you.   Applying the monitor   Shave hair from upper left chest.   Hold abrader disc by orange tab.  Rub abrader in 40 strokes over left upper chest as indicated in your monitor instructions.   Clean area with 4 enclosed alcohol pads .  Use all pads to assure are is cleaned thoroughly.  Let dry.   Apply patch as indicated in monitor instructions.  Patch will be place under collarbone on left side of chest with arrow pointing upward.   Rub patch adhesive wings for 2 minutes.Remove white label marked "1".  Remove white label marked "2".  Rub patch adhesive wings for 2 additional minutes.   While looking in a mirror, press and release button in center of patch.  A small green light will flash 3-4 times .  This will be your only indicator the monitor  has been turned on.     Do not shower for the first 24 hours.  You may shower after the first 24 hours.   Press button if you feel a symptom. You will hear a small click.  Record Date, Time and Symptom in the Patient Log Book.   When you are ready to remove patch, follow instructions on last 2 pages of Patient Log Book.  Stick patch monitor onto last page of Patient Log Book.   Place Patient Log Book in [place] box.  Use locking tab on box and tape box closed securely.  The [employer] and [employer] has [employer] on it.  Please place in mailbox as soon as possible.  Your physician should have your test results approximately 7 days after the monitor has been mailed back to [facility].   Call [place] at [phone] if you have questions regarding your ZIO XT patch monitor.  Call them immediately if you see  an orange light blinking on your monitor.   If your monitor falls off in less than 4 days contact our Monitor department at [phone].  If your monitor becomes loose or falls off after 4 days call [employer] at [phone] for suggestions on securing your monitor.

## 2019-10-29 NOTE — Progress Notes (Signed)
Cardiology Office Note:    Date:  10/29/2019   ID:  Stanley Bennett, DOB 02/27/1956, MRN [id number]  PCP:  Caren Macadam, MD  Cardiologist:  Buford Dresser, MD  Referring MD: Eulas Post, MD   CC: new patient evaluation for chest discomfort and variable heart rate  History of Present Illness:    Stanley Bennett is a 64 y.o. male with a hx of hyperlipidemia, family history of heart disease who is seen as a new consult at the request of Burchette, Alinda Sierras, MD for the evaluation and management of chest discomfort and variable heart rate.  Note from Dr. Elease Hashimoto from 10/25/19 reviewed today. He reported two episodes in the last month when his heart rate went to ~120 bpm lasting about an hour. No chest pain with it. Separately he has had intermittent chest pressure relieved by belching. Nonexertional. FH notable for father with CABG age 45 and sister with CAD at 25.  Today: -Initial onset of elevated HR: was laying in bed, noted HR went up to 119 laying in bed. Two other times laying in bed noted HR up to 90s. Usual resting HR is upper 40s/low 50s. No other symptoms. Longest episode about an hour. -Initial onset on chest pressure: also within the last month or so. Can come any time, though notices more at night.  -Quality: feels like a fluttering/tickling discomfort low epigastric moving towards more of a pressure in the upper chest -Frequency: few times/week -Duration: comes and goes, difficult to say how long it lasts -Associated symptoms: fatigue, dizziness -Aggravating/alleviating factors: nothing he is aware of -Prior cardiac history: no personal history -Prior ECG: sinus bradycardia -Prior workup: none -Prior treatment: none -Alcohol: 8-9 beers/week -Tobacco: never -Comorbidities: no diabetes, no high blood pressure, no sleep apnea. Does have hyperlipidemia, has been on simvastatin >20 years. -Exercise level: very active, hikes, etc without limitations.  -Cardiac ROS: no shortness of breath, no PND, no orthopnea, no LE edema, no syncope -Family history: father had CABG at 68 years old. Sister just had a stent placed in her late 12s. They were both less active than he is.  Notes also separate rare sharp fleeting chest pinching, moves on both sides of his chest, nonexertional. Also notes some tenderness/needs to take a deep breath after playing guitar with the guitar resting on his chest.  Past Medical History:  Diagnosis Date  . Allergy   . Basal cell carcinoma    scalp  . Gout   . Hyperlipidemia     Past Surgical History:  Procedure Laterality Date  . APPENDECTOMY    . BASAL CELL CARCINOMA EXCISION    . CERVICAL DISC SURGERY    . TONSILLECTOMY      Current Medications: Current Outpatient Medications on File Prior to Visit  Medication Sig  . allopurinol (ZYLOPRIM) 300 MG tablet TAKE 1 TABLET(300 MG) BY MOUTH DAILY AS DIRECTED  . Cetirizine HCl (KLS ALLER-TEC PO) Take by mouth.  . fluticasone (FLONASE) 50 MCG/ACT nasal spray Place 1 spray into both nostrils 2 (two) times daily.   . meclizine (ANTIVERT) 25 MG tablet Take 1 tablet (25 mg total) by mouth 3 (three) times daily as needed for dizziness. (Patient not taking: Reported on 10/25/2019)  . naproxen (NAPROSYN) 500 MG tablet Take 1 tablet (500 mg total) by mouth 2 (two) times daily with a meal. (Patient not taking: Reported on 10/25/2019)  . pantoprazole (PROTONIX) 20 MG tablet TAKE 1 TABLET(20 MG) BY MOUTH DAILY BEFORE BREAKFAST  .  PRESCRIPTION MEDICATION Allergy shots every week  . simvastatin (ZOCOR) 40 MG tablet TAKE 1 TABLET(40 MG) BY MOUTH EVERY EVENING  . tadalafil (CIALIS) 5 MG tablet Take 1 tablet (5 mg total) by mouth daily.   No current facility-administered medications on file prior to visit.     Allergies:   Tamiflu [oseltamivir]   Social History   Tobacco Use  . Smoking status: Never Smoker  . Smokeless tobacco: Never Used  Substance Use Topics  . Alcohol  use: Yes    Alcohol/week: 6.0 standard drinks    Types: 6 Cans of beer per week    Comment: 2 - 5 days per week  . Drug use: No    Family History: family history includes Arthritis in his father and mother; Cancer in his sister; Diabetes in his sister; Heart disease in his father; Kidney failure in his cousin and cousin; Other in his brother. There is no history of Colon cancer, Esophageal cancer, or Rectal cancer.  ROS:   Please see the history of present illness.  Additional pertinent ROS: Constitutional: Negative for chills, fever, night sweats, unintentional weight loss  HENT: Negative for ear pain and hearing loss.   Eyes: Negative for loss of vision and eye pain.  Respiratory: Negative for cough, sputum, wheezing.   Cardiovascular: See HPI. Gastrointestinal: Negative for abdominal pain, melena, and hematochezia.  Genitourinary: Negative for dysuria and hematuria.  Musculoskeletal: Negative for falls and myalgias.  Skin: Negative for itching and rash.  Neurological: Negative for focal weakness, focal sensory changes and loss of consciousness.  Endo/Heme/Allergies: Does not bruise/bleed easily.     EKGs/Labs/Other Studies Reviewed:    The following studies were reviewed today: No prior cardiac studies  EKG:  EKG is personally reviewed.  The ekg ordered today demonstrates sinus bradycardia at 53 bpm.  Recent Labs: 01/02/2019: ALT 20 10/25/2019: BUN 16; Creatinine, Ser 0.92; Magnesium 2.0; Potassium 4.5; Sodium 139; TSH 3.27  Recent Lipid Panel    Component Value Date/Time   CHOL 186 01/02/2019 0824   TRIG 104.0 01/02/2019 0824   HDL 80.00 01/02/2019 0824   CHOLHDL 2 01/02/2019 0824   VLDL 20.8 01/02/2019 0824   LDLCALC 86 01/02/2019 0824    Physical Exam:    VS:  BP 108/70   Pulse (!) 53   Temp 97.6 F (36.4 C)   Ht 5\' 9"  (1.753 m)   Wt 175 lb (79.4 kg)   BMI 25.84 kg/m     Wt Readings from Last 3 Encounters:  10/25/19 174 lb 4.8 oz (79.1 kg)  02/12/19 175  lb (79.4 kg)  01/17/19 175 lb (79.4 kg)    GEN: Well nourished, well developed in no acute distress HEENT: Normal, moist mucous membranes NECK: No JVD CARDIAC: regular rhythm, normal S1 and S2, no rubs or gallops. No murmurs. VASCULAR: Radial and DP pulses 2+ bilaterally. No carotid bruits RESPIRATORY:  Clear to auscultation without rales, wheezing or rhonchi  ABDOMEN: Soft, non-tender, non-distended MUSCULOSKELETAL:  Ambulates independently SKIN: Warm and dry, no edema NEUROLOGIC:  Alert and oriented x 3. No focal neuro deficits noted. PSYCHIATRIC:  Normal affect    ASSESSMENT:    1. Tachycardia   2. Precordial pain   3. Hyperlipidemia, unspecified hyperlipidemia type   4. Family history of heart disease   5. Cardiac risk counseling   6. Counseling on health promotion and disease prevention    PLAN:    Chest discomfort: somewhat atypical symptoms, but does have risk factors -discussed treadmill  stress, nuclear stress/lexiscan, and CT coronary angiography. Discussed pros and cons of each, including but not limited to false positive/false negative risk, radiation risk, and risk of IV contrast dye. Based on shared decision making, decision was made to pursue CT coronary angiography. -resting bradycardia, does not need beta blocker -counseled on use of sublingual nitroglycerin and its importance to a good test -counseled on red flag warning signs that need immediate medical attention  Intermittent tachycardia: -will get monitor to evaluate for arrhythmia  Hyperlipidemia: -has been on simvastatin for years -discuss changes based on ct results  Family history of heart disease: prevention as below  Cardiac risk counseling and prevention recommendations: -recommend heart healthy/Mediterranean diet, with whole grains, fruits, vegetable, fish, lean meats, nuts, and olive oil. Limit salt. -recommend moderate walking, 3-5 times/week for 30-50 minutes each session. Aim for at least 150  minutes.week. Goal should be pace of 3 miles/hours, or walking 1.5 miles in 30 minutes -recommend avoidance of tobacco products. Avoid excess alcohol. -ASCVD risk score: The 10-year ASCVD risk score Mikey Bussing DC Brooke Bonito., et al., 2013) is: 6.9%   Values used to calculate the score:     Age: 48 years     Sex: Male     Is Non-Hispanic African American: No     Diabetic: No     Tobacco smoker: No     Systolic Blood Pressure: 123XX123 mmHg     Is BP treated: No     HDL Cholesterol: 80 mg/dL     Total Cholesterol: 186 mg/dL    Plan for follow up: If CT/monitor unremarkable, will follow up every 2 years for prevention. If abnormal testing, will bring back sooner to discuss next steps.  Buford Dresser, MD, PhD   [facility]    Medication Adjustments/Labs and Tests Ordered: Current medicines are reviewed at length with the patient today.  Concerns regarding medicines are outlined above.  Orders Placed This Encounter  Procedures  . CT CORONARY MORPH W/CTA COR W/SCORE W/CA W/CM &/OR WO/CM  . CT CORONARY FRACTIONAL FLOW RESERVE DATA PREP  . CT CORONARY FRACTIONAL FLOW RESERVE FLUID ANALYSIS  . LONG TERM MONITOR (3-14 DAYS)  . EKG 12-Lead   No orders of the defined types were placed in this encounter.   Patient Instructions  Medication Instructions:  Your Physician recommend you continue on your current medication as directed.    *If you need a refill on your cardiac medications before your next appointment, please call your pharmacy*   Lab Work: None   Testing/Procedures: Cardiac CT Angiography (CTA), is a special type of CT scan that uses a computer to produce multi-dimensional views of major blood vessels throughout the body. In CT angiography, a contrast material is injected through an IV to help visualize the blood vessels  Our physician has recommended that you wear an 14 DAY ZIO-PATCH monitor. The Zio patch cardiac monitor continuously records heart rhythm data for up  to 14 days, this is for patients being evaluated for multiple types heart rhythms. For the first 24 hours post application, please avoid getting the Zio monitor wet in the shower or by excessive sweating during exercise. After that, feel free to carry on with regular activities. Keep soaps and lotions away from the ZIO XT Patch.   Someone will call to mail monitor.        Follow-Up: At [facility], you and your health needs are our priority.  As part of our continuing mission to provide you with exceptional  heart care, we have created designated Provider Care Teams.  These Care Teams include your primary Cardiologist (physician) and Advanced Practice Providers (APPs -  Physician Assistants and Nurse Practitioners) who all work together to provide you with the care you need, when you need it.  We recommend signing up for the patient portal called "MyChart".  Sign up information is provided on this After Visit Summary.  MyChart is used to connect with patients for Virtual Visits (Telemedicine).  Patients are able to view lab/test results, encounter notes, upcoming appointments, etc.  Non-urgent messages can be sent to your provider as well.   To learn more about what you can do with MyChart, go to [email or web address].    Your next appointment:   2 year(s)  The format for your next appointment:   In Person  Provider:   Buford Dresser, MD  Your cardiac CT will be scheduled at one of the below locations:   [facility] [address] [place], [place] [address] [phone]  If scheduled at [facility], please arrive at the [facility] main entrance of [facility] 30 minutes prior to test start time. Proceed to the [facility] Radiology Department (first floor) to check-in and test prep.  If scheduled at [facility], please arrive 15 mins early for check-in and test prep.  Please follow these instructions carefully  (unless otherwise directed):  Hold all erectile dysfunction medications at least 3 days (72 hrs) prior to test. Tadalafil   On the Night Before the Test: . Be sure to Drink plenty of water. . Do not consume any caffeinated/decaffeinated beverages or chocolate 12 hours prior to your test. . Do not take any antihistamines 12 hours prior to your test.   On the Day of the Test: . Drink plenty of water. Do not drink any water within one hour of the test. . Do not eat any food 4 hours prior to the test. . You may take your regular medications prior to the test.        After the Test: . Drink plenty of water. . After receiving IV contrast, you may experience a mild flushed feeling. This is normal. . On occasion, you may experience a mild rash up to 24 hours after the test. This is not dangerous. If this occurs, you can take Benadryl 25 mg and increase your fluid intake. . If you experience trouble breathing, this can be serious. If it is severe call 911 IMMEDIATELY. If it is mild, please call our office.   Once we have confirmed authorization from your insurance company, we will call you to set up a date and time for your test.   For non-scheduling related questions, please contact the cardiac imaging nurse navigator should you have any questions/concerns: Marchia Bond, RN Navigator Cardiac Imaging [facility] [phone] office  For scheduling needs, including cancellations and rescheduling, please call [phone].   ZIO XT- Long Term Monitor Instructions   Your physician has requested you wear your ZIO patch monitor___14____days.   This is a single patch monitor.  [employer] supplies one patch monitor per enrollment.  Additional stickers are not available.   Please do not apply patch if you will be having a Nuclear Stress Test, Echocardiogram, Cardiac CT, MRI, or Chest Xray during the time frame you would be wearing the monitor. The patch cannot be worn  during these tests.  You cannot remove and re-apply the ZIO XT patch monitor.  Your ZIO patch monitor will be sent USPS Priority mail from [facility] directly to your home address. The monitor may also be mailed to a PO BOX if home delivery is not available.   It may take 3-5 days to receive your monitor after you have been enrolled.   Once you have received you monitor, please review enclosed instructions.  Your monitor has already been registered assigning a specific monitor serial # to you.   Applying the monitor   Shave hair from upper left chest.   Hold abrader disc by orange tab.  Rub abrader in 40 strokes over left upper chest as indicated in your monitor instructions.   Clean area with 4 enclosed alcohol pads .  Use all pads to assure are is cleaned thoroughly.  Let dry.   Apply patch as indicated in monitor instructions.  Patch will be place under collarbone on left side of chest with arrow pointing upward.   Rub patch adhesive wings for 2 minutes.Remove white label marked "1".  Remove white label marked "2".  Rub patch adhesive wings for 2 additional minutes.   While looking in a mirror, press and release button in center of patch.  A small green light will flash 3-4 times .  This will be your only indicator the monitor has been turned on.     Do not shower for the first 24 hours.  You may shower after the first 24 hours.   Press button if you feel a symptom. You will hear a small click.  Record Date, Time and Symptom in the Patient Log Book.   When you are ready to remove patch, follow instructions on last 2 pages of Patient Log Book.  Stick patch monitor onto last page of Patient Log Book.   Place Patient Log Book in [place] box.  Use locking tab on box and tape box closed securely.  The [employer] has [employer] on it.  Please place in mailbox as soon as possible.  Your physician should have your test results approximately 7 days after the monitor has  been mailed back to [facility].   Call [place] at [phone] if you have questions regarding your ZIO XT patch monitor.  Call them immediately if you see an orange light blinking on your monitor.   If your monitor falls off in less than 4 days contact our Monitor department at [phone].  If your monitor becomes loose or falls off after 4 days call [employer] at [phone] for suggestions on securing your monitor.      Signed, Buford Dresser, MD PhD 10/29/2019 10:28 AM    [place]

## 2019-11-01 ENCOUNTER — Other Ambulatory Visit (INDEPENDENT_AMBULATORY_CARE_PROVIDER_SITE_OTHER): Payer: PPO

## 2019-11-01 DIAGNOSIS — R000 Tachycardia, unspecified: Secondary | ICD-10-CM | POA: Diagnosis not present

## 2019-11-06 DIAGNOSIS — J301 Allergic rhinitis due to pollen: Secondary | ICD-10-CM | POA: Diagnosis not present

## 2019-11-06 DIAGNOSIS — J3081 Allergic rhinitis due to animal (cat) (dog) hair and dander: Secondary | ICD-10-CM | POA: Diagnosis not present

## 2019-11-06 DIAGNOSIS — J3089 Other allergic rhinitis: Secondary | ICD-10-CM | POA: Diagnosis not present

## 2019-11-08 ENCOUNTER — Encounter: Payer: Self-pay | Admitting: Family Medicine

## 2019-11-08 DIAGNOSIS — N401 Enlarged prostate with lower urinary tract symptoms: Secondary | ICD-10-CM

## 2019-11-08 MED ORDER — TADALAFIL 5 MG PO TABS: 5.0000 mg | ORAL_TABLET | Freq: Every day | ORAL | 0 refills | Status: DC

## 2019-11-15 ENCOUNTER — Other Ambulatory Visit: Payer: Self-pay | Admitting: Family Medicine

## 2019-11-20 DIAGNOSIS — J3081 Allergic rhinitis due to animal (cat) (dog) hair and dander: Secondary | ICD-10-CM | POA: Diagnosis not present

## 2019-11-20 DIAGNOSIS — J301 Allergic rhinitis due to pollen: Secondary | ICD-10-CM | POA: Diagnosis not present

## 2019-11-20 DIAGNOSIS — L2089 Other atopic dermatitis: Secondary | ICD-10-CM | POA: Diagnosis not present

## 2019-11-20 DIAGNOSIS — J3089 Other allergic rhinitis: Secondary | ICD-10-CM | POA: Diagnosis not present

## 2019-11-27 DIAGNOSIS — J3089 Other allergic rhinitis: Secondary | ICD-10-CM | POA: Diagnosis not present

## 2019-11-27 DIAGNOSIS — J3081 Allergic rhinitis due to animal (cat) (dog) hair and dander: Secondary | ICD-10-CM | POA: Diagnosis not present

## 2019-11-27 DIAGNOSIS — J301 Allergic rhinitis due to pollen: Secondary | ICD-10-CM | POA: Diagnosis not present

## 2019-11-28 DIAGNOSIS — R000 Tachycardia, unspecified: Secondary | ICD-10-CM | POA: Diagnosis not present

## 2019-12-02 ENCOUNTER — Telehealth (HOSPITAL_COMMUNITY): Payer: Self-pay | Admitting: Emergency Medicine

## 2019-12-02 ENCOUNTER — Encounter (HOSPITAL_COMMUNITY): Payer: Self-pay

## 2019-12-02 NOTE — Telephone Encounter (Signed)
Attempted to call patient regarding upcoming cardiac CT appointment. °Left message on voicemail with name and callback number °Yailyn Strack RN Navigator Cardiac Imaging °[facility] °[phone] Office °[phone] Cell ° °

## 2019-12-02 NOTE — Telephone Encounter (Signed)
Pt returning phone call regarding upcoming cardiac imaging study; pt verbalizes understanding of appt date/time, parking situation and where to check in, pre-test NPO status and medications ordered, and verified current allergies; name and call back number provided for further questions should they arise Jarnell Cordaro RN Navigator Cardiac Imaging [facility] [phone] office [phone] cell   

## 2019-12-03 ENCOUNTER — Telehealth (HOSPITAL_COMMUNITY): Payer: Self-pay | Admitting: Emergency Medicine

## 2019-12-03 ENCOUNTER — Other Ambulatory Visit: Payer: Self-pay

## 2019-12-03 ENCOUNTER — Ambulatory Visit (HOSPITAL_COMMUNITY)
Admission: RE | Admit: 2019-12-03 | Discharge: 2019-12-03 | Disposition: A | Discharge status: Home / Self Care | Payer: PPO | Source: Ambulatory Visit | Attending: Cardiology | Admitting: Cardiology

## 2019-12-03 DIAGNOSIS — R072 Precordial pain: Secondary | ICD-10-CM | POA: Insufficient documentation

## 2019-12-03 MED ORDER — NITROGLYCERIN 0.4 MG SL SUBL
0.8000 mg | SUBLINGUAL_TABLET | Freq: Once | SUBLINGUAL | Status: AC
  Administered 2019-12-03: 0.8 mg via SUBLINGUAL

## 2019-12-03 MED ORDER — IOHEXOL 350 MG/ML SOLN
80.0000 mL | Freq: Once | INTRAVENOUS | Status: AC | PRN
  Administered 2019-12-03: 80 mL via INTRAVENOUS

## 2019-12-03 MED ORDER — NITROGLYCERIN 0.4 MG SL SUBL
SUBLINGUAL_TABLET | SUBLINGUAL | Status: AC
  Filled 2019-12-03: qty 2

## 2019-12-03 NOTE — Telephone Encounter (Signed)
Attempted to call patient regarding upcoming cardiac CT appointment. Left message on voicemail with name and callback number Marchia Bond RN Navigator Cardiac Imaging [facility] [phone] Office [phone] Cell   Pt called and left VM saying he had an additional question. Attempted call but no answer

## 2019-12-04 ENCOUNTER — Ambulatory Visit (HOSPITAL_COMMUNITY)
Admission: RE | Admit: 2019-12-04 | Discharge: 2019-12-04 | Disposition: A | Discharge status: Home / Self Care | Payer: PPO | Source: Ambulatory Visit | Attending: Cardiology | Admitting: Cardiology

## 2019-12-04 DIAGNOSIS — J301 Allergic rhinitis due to pollen: Secondary | ICD-10-CM | POA: Diagnosis not present

## 2019-12-04 DIAGNOSIS — J3089 Other allergic rhinitis: Secondary | ICD-10-CM | POA: Diagnosis not present

## 2019-12-04 DIAGNOSIS — J3081 Allergic rhinitis due to animal (cat) (dog) hair and dander: Secondary | ICD-10-CM | POA: Diagnosis not present

## 2019-12-04 DIAGNOSIS — R072 Precordial pain: Secondary | ICD-10-CM | POA: Diagnosis not present

## 2019-12-06 DIAGNOSIS — R072 Precordial pain: Secondary | ICD-10-CM | POA: Diagnosis not present

## 2019-12-09 NOTE — Telephone Encounter (Signed)
Pt updated with test results along with MD's recommendations. Pt voiced he would like to discuss further. Appointment scheduled for 4/28.

## 2019-12-11 ENCOUNTER — Other Ambulatory Visit: Payer: Self-pay

## 2019-12-11 ENCOUNTER — Ambulatory Visit: Payer: PPO | Admitting: Cardiology

## 2019-12-11 ENCOUNTER — Encounter: Payer: Self-pay | Admitting: Cardiology

## 2019-12-11 VITALS — BP 100/54 | HR 63 | Temp 97.8°F | Ht 69.5 in | Wt 171.0 lb

## 2019-12-11 DIAGNOSIS — E7800 Pure hypercholesterolemia, unspecified: Secondary | ICD-10-CM | POA: Diagnosis not present

## 2019-12-11 DIAGNOSIS — J3089 Other allergic rhinitis: Secondary | ICD-10-CM | POA: Diagnosis not present

## 2019-12-11 DIAGNOSIS — I2510 Atherosclerotic heart disease of native coronary artery without angina pectoris: Secondary | ICD-10-CM

## 2019-12-11 DIAGNOSIS — R002 Palpitations: Secondary | ICD-10-CM

## 2019-12-11 DIAGNOSIS — I25118 Atherosclerotic heart disease of native coronary artery with other forms of angina pectoris: Secondary | ICD-10-CM

## 2019-12-11 DIAGNOSIS — Z8249 Family history of ischemic heart disease and other diseases of the circulatory system: Secondary | ICD-10-CM

## 2019-12-11 DIAGNOSIS — J301 Allergic rhinitis due to pollen: Secondary | ICD-10-CM | POA: Diagnosis not present

## 2019-12-11 DIAGNOSIS — J3081 Allergic rhinitis due to animal (cat) (dog) hair and dander: Secondary | ICD-10-CM | POA: Diagnosis not present

## 2019-12-11 DIAGNOSIS — Z7189 Other specified counseling: Secondary | ICD-10-CM

## 2019-12-11 MED ORDER — NITROGLYCERIN 0.4 MG SL SUBL: 0.4000 mg | SUBLINGUAL_TABLET | SUBLINGUAL | 3 refills | Status: DC | PRN

## 2019-12-11 MED ORDER — ROSUVASTATIN CALCIUM 20 MG PO TABS: 20.0000 mg | ORAL_TABLET | Freq: Every day | ORAL | 3 refills | Status: DC

## 2019-12-11 MED ORDER — ASPIRIN EC 81 MG PO TBEC: 81.0000 mg | DELAYED_RELEASE_TABLET | Freq: Every day | ORAL | 3 refills | Status: AC

## 2019-12-11 NOTE — Progress Notes (Signed)
Cardiology Office Note:    Date:  12/11/2019   ID:  Stanley Bennett, DOB 01/02/1956, MRN [id number]  PCP:  Caren Macadam, MD  Cardiologist:  Buford Dresser, MD  Referring MD: Caren Macadam, MD   Chief Complaint  Patient presents with  . Follow-up    CTA results.    History of Present Illness:    Stanley Bennett is a 64 y.o. male with a hx of hyperlipidemia, family history of heart disease who is seen for follow up. I initially met him 10/29/19 as a new consult at the request of Koberlein, Junell C, MD for the evaluation and management of chest discomfort and variable heart rate.  Today: No chest discomfort with day to day exertion, but has felt with heavy exertion. Has also had at rest. Sharp, pinching sensation.  Here with wife today. Reviewed CT results with him, including viewing actual images, calcium score.   Taking aspirin intermittently over the last two months. No issues with this, no bleeding issues. Discussed guidelines re: daily aspirin given CAD. He is amenable  Discussed lipids extensively today. Discussed intensity of statin, goals for treatment. Willing to intensify today, see below.  Occasional palpitations, awaiting monitor results.  Denies shortness of breath at rest or with normal exertion. No PND, orthopnea, LE edema or unexpected weight gain. No syncope.   Past Medical History:  Diagnosis Date  . Allergy   . Basal cell carcinoma    scalp  . Gout   . Hyperlipidemia     Past Surgical History:  Procedure Laterality Date  . APPENDECTOMY    . BASAL CELL CARCINOMA EXCISION    . CERVICAL DISC SURGERY    . TONSILLECTOMY      Current Medications: Current Outpatient Medications on File Prior to Visit  Medication Sig  . allopurinol (ZYLOPRIM) 300 MG tablet TAKE 1 TABLET(300 MG) BY MOUTH DAILY AS DIRECTED  . Cetirizine HCl (KLS ALLER-TEC PO) Take by mouth.  . fluticasone (FLONASE) 50 MCG/ACT nasal spray Place 1 spray into  both nostrils 2 (two) times daily.   . pantoprazole (PROTONIX) 20 MG tablet TAKE 1 TABLET(20 MG) BY MOUTH DAILY BEFORE BREAKFAST  . PRESCRIPTION MEDICATION Allergy shots every week  . simvastatin (ZOCOR) 40 MG tablet TAKE 1 TABLET(40 MG) BY MOUTH EVERY EVENING  . tadalafil (CIALIS) 5 MG tablet Take 1 tablet (5 mg total) by mouth daily.   No current facility-administered medications on file prior to visit.     Allergies:   Tamiflu [oseltamivir]   Social History   Tobacco Use  . Smoking status: Never Smoker  . Smokeless tobacco: Never Used  Substance Use Topics  . Alcohol use: Yes    Alcohol/week: 6.0 standard drinks    Types: 6 Cans of beer per week    Comment: 2 - 5 days per week  . Drug use: No    Family History: family history includes Arthritis in his father and mother; Cancer in his sister; Diabetes in his sister; Heart disease in his father; Kidney failure in his cousin and cousin; Other in his brother. There is no history of Colon cancer, Esophageal cancer, or Rectal cancer.  father had CABG at 72 years old. Sister just had a stent placed in her late 51s. They were both less active than he is.  ROS:   Please see the history of present illness.  Additional pertinent ROS otherwise unremarkable.   EKGs/Labs/Other Studies Reviewed:    The following studies were reviewed  today: Cardiac CT 12/03/19 FINDINGS: Coronary calcium score: The patient's coronary artery calcium score is 395, which places the patient in the 79th percentile.  Coronary arteries: Normal coronary origins.  Right dominance.  Right Coronary Artery: Normal caliber vessel, gives rise to PDA. Proximal RCA with focal calcified plaque with 25-49% stenosis. There is a mixed calcified and noncalcified plaque in the mid RCA with 1-24% stenosis. There are several small, focal calcified plaques in the distal RCA with 1-24% stenosis.  Left Main Coronary Artery: Normal caliber vessel. No significant plaque or  stenosis. Small caliber ramus intermedius branch.  Left Anterior Descending Coronary Artery: Normal caliber vessel. Proximal portion with mixed calcified and noncalcified plaque with 25-49% stenosis. Mid LAD with mixed plaque, appears to be 25-49% stenosis but cannot exclude higher degree given calcium blooming. Distal LAD with calcified plaque and 1-24% stenosis. LAD gives rise to 4 small diagonal branches.  Left Circumflex Artery: Normal caliber vessel. No significant plaque or stenosis. Gives rise to 2 OM branches.  Aorta: Normal size, 33 mm at the mid ascending aorta (level of the PA bifurcation) measured double oblique. No calcifications. No dissection.  Aortic Valve: No calcifications. Trileaflet.  Other findings:  Normal pulmonary vein drainage into the left atrium.  Normal left atrial appendage without a thrombus.  Normal size of the pulmonary artery.  IMPRESSION: 1. Mild nonobstructive CAD, CADRADS = 2. However, there is one lesion in the mid LAD that cannot be fully visualized due to calcium and may have higher degree stenosis. CT FFR will be performed and reported separately.  2. Coronary calcium score of 395. This was 79th percentile for age and sex matched control.  3. Normal coronary origin with right dominance.  FFR negative for significant stenosis.  EKG:  EKG is personally reviewed.  The ekg ordered 10/29/19 demonstrates sinus bradycardia at 53 bpm  Recent Labs: 01/02/2019: ALT 20 10/25/2019: BUN 16; Creatinine, Ser 0.92; Magnesium 2.0; Potassium 4.5; Sodium 139; TSH 3.27  Recent Lipid Panel    Component Value Date/Time   CHOL 186 01/02/2019 0824   TRIG 104.0 01/02/2019 0824   HDL 80.00 01/02/2019 0824   CHOLHDL 2 01/02/2019 0824   VLDL 20.8 01/02/2019 0824   LDLCALC 86 01/02/2019 0824    Physical Exam:    VS:  BP (!) 100/54 (BP Location: Left Arm, Patient Position: Sitting, Cuff Size: Normal)   Pulse 63   Temp 97.8 F (36.6 C)    Ht 5' 9.5" (1.765 m)   Wt 171 lb (77.6 kg)   BMI 24.89 kg/m     Wt Readings from Last 3 Encounters:  12/11/19 171 lb (77.6 kg)  10/29/19 175 lb (79.4 kg)  10/25/19 174 lb 4.8 oz (79.1 kg)    GEN: Well nourished, well developed in no acute distress HEENT: Normal, moist mucous membranes NECK: No JVD CARDIAC: regular rhythm, normal S1 and S2, no rubs or gallops. No murmur. VASCULAR: Radial and DP pulses 2+ bilaterally. No carotid bruits RESPIRATORY:  Clear to auscultation without rales, wheezing or rhonchi  ABDOMEN: Soft, non-tender, non-distended MUSCULOSKELETAL:  Ambulates independently SKIN: Warm and dry, no edema NEUROLOGIC:  Alert and oriented x 3. No focal neuro deficits noted. PSYCHIATRIC:  Normal affect    ASSESSMENT:    1. Nonobstructive atherosclerosis of coronary artery   2. Coronary artery disease of native artery of native heart with stable angina pectoris (HCC)   3. Palpitations   4. Family history of heart disease   5. Pure hypercholesterolemia  6. Cardiac risk counseling   7. Counseling on health promotion and disease prevention    PLAN:    Nonobstructive CAD with chest pain:  -we reviewed his CT results extensively, including viewing his actual images and calcium score -starting aspirin 81 mg daily -changing statin, see below -no beta blocker as he is in sinus bradycardia at baseline -prescribed sublingual nitroglycerin and instructed on use today. Counseled extensively on risk of NG and cialis. -counseled on red flag warning signs that need immediate medical attention  Intermittent tachycardia: -awaiting monitor results  Hypercholesterolemia: -has been on simvastatin for years, stopping today given CAD -based on CT results, will intensify to rosuvastatin 20 mg daily -recheck lipids at follow up  Family history of heart disease: secondary prevention as below  Cardiac risk counseling and prevention recommendations: -recommend heart  healthy/Mediterranean diet, with whole grains, fruits, vegetable, fish, lean meats, nuts, and olive oil. Limit salt. -recommend moderate walking, 3-5 times/week for 30-50 minutes each session. Aim for at least 150 minutes.week. Goal should be pace of 3 miles/hours, or walking 1.5 miles in 30 minutes -recommend avoidance of tobacco products. Avoid excess alcohol.  Plan for follow up: 3 months  Total time of encounter: 54 minutes total time of encounter, including 44 minutes spent in face-to-face patient care. This time includes coordination of care and counseling regarding CT results and recommendations. Remainder of non-face-to-face time involved reviewing chart documents/testing relevant to the patient encounter and documentation in the medical record.  In room at 2:30 PM, out at 3:14 PM  Buford Dresser, MD, PhD [facility]   Medication Adjustments/Labs and Tests Ordered: Current medicines are reviewed at length with the patient today.  Concerns regarding medicines are outlined above.  No orders of the defined types were placed in this encounter.  Meds ordered this encounter  Medications  . aspirin EC 81 MG tablet    Sig: Take 1 tablet (81 mg total) by mouth daily.    Dispense:  90 tablet    Refill:  3  . rosuvastatin (CRESTOR) 20 MG tablet    Sig: Take 1 tablet (20 mg total) by mouth daily.    Dispense:  90 tablet    Refill:  3  . nitroGLYCERIN (NITROSTAT) 0.4 MG SL tablet    Sig: Place 1 tablet (0.4 mg total) under the tongue every 5 (five) minutes as needed for chest pain. Do not use within 48 ours of using cialis.    Dispense:  90 tablet    Refill:  3    Patient Instructions  Medication Instructions:  Stop taking- Simvastatin 40 mg daily  Start taking- Rosuvastatin 20 mg daily           Aspirin 81 mg daily                      Nitroglycerin 0.4 mg 1 tablet (0.4 mg total) under the tongue every 5 (five) minutes as needed for chest pain. Do not use  within 48 ours of using cialis  *If you need a refill on your cardiac medications before your next appointment, please call your pharmacy*   Lab Work: None    Testing/Procedures: None   Follow-Up: At [facility], you and your health needs are our priority.  As part of our continuing mission to provide you with exceptional heart care, we have created designated Provider Care Teams.  These Care Teams include your primary Cardiologist (physician) and Advanced Practice Providers (APPs -  Physician Assistants and Nurse Practitioners) who all work together to provide you with the care you need, when you need it.  We recommend signing up for the patient portal called "MyChart".  Sign up information is provided on this After Visit Summary.  MyChart is used to connect with patients for Virtual Visits (Telemedicine).  Patients are able to view lab/test results, encounter notes, upcoming appointments, etc.  Non-urgent messages can be sent to your provider as well.   To learn more about what you can do with MyChart, go to [email or web address].    Your next appointment:   3 month(s)  The format for your next appointment:   In Person  Provider:   Buford Dresser, MD  Nitroglycerin sublingual tablets What is this medicine? NITROGLYCERIN (nye troe GLI ser in) is a type of vasodilator. It relaxes blood vessels, increasing the blood and oxygen supply to your heart. This medicine is used to relieve chest pain caused by angina. It is also used to prevent chest pain before activities like climbing stairs, going outdoors in cold weather, or sexual activity. This medicine may be used for other purposes; ask your health care provider or pharmacist if you have questions. COMMON BRAND NAME(S): Nitroquick, Nitrostat, Nitrotab What should I tell my health care provider before I take this medicine? They need to know if you have any of these conditions:  anemia  head injury, recent stroke, or  bleeding in the brain  liver disease  previous heart attack  an unusual or allergic reaction to nitroglycerin, other medicines, foods, dyes, or preservatives  pregnant or trying to get pregnant  breast-feeding How should I use this medicine? Take this medicine by mouth as needed. At the first sign of an angina attack (chest pain or tightness) place one tablet under your tongue. You can also take this medicine 5 to 10 minutes before an event likely to produce chest pain. Follow the directions on the prescription label. Let the tablet dissolve under the tongue. Do not swallow whole. Replace the dose if you accidentally swallow it. It will help if your mouth is not dry. Saliva around the tablet will help it to dissolve more quickly. Do not eat or drink, smoke or chew tobacco while a tablet is dissolving. If you are not better within 5 minutes after taking ONE dose of nitroglycerin, call 9-1-1 immediately to seek emergency medical care. Do not take more than 3 nitroglycerin tablets over 15 minutes. If you take this medicine often to relieve symptoms of angina, your doctor or health care professional may provide you with different instructions to manage your symptoms. If symptoms do not go away after following these instructions, it is important to call 9-1-1 immediately. Do not take more than 3 nitroglycerin tablets over 15 minutes. Talk to your pediatrician regarding the use of this medicine in children. Special care may be needed. Overdosage: If you think you have taken too much of this medicine contact a poison control center or emergency room at once. NOTE: This medicine is only for you. Do not share this medicine with others. What if I miss a dose? This does not apply. This medicine is only used as needed. What may interact with this medicine? Do not take this medicine with any of the following medications:  certain migraine medicines like ergotamine and dihydroergotamine (DHE)  medicines  used to treat erectile dysfunction like sildenafil, tadalafil, and vardenafil  riociguat This medicine may also interact with the following medications:  alteplase  aspirin  heparin  medicines for high blood pressure  medicines for mental depression  other medicines used to treat angina  phenothiazines like chlorpromazine, mesoridazine, prochlorperazine, thioridazine This list may not describe all possible interactions. Give your health care provider a list of all the medicines, herbs, non-prescription drugs, or dietary supplements you use. Also tell them if you smoke, drink alcohol, or use illegal drugs. Some items may interact with your medicine. What should I watch for while using this medicine? Tell your doctor or health care professional if you feel your medicine is no longer working. Keep this medicine with you at all times. Sit or lie down when you take your medicine to prevent falling if you feel dizzy or faint after using it. Try to remain calm. This will help you to feel better faster. If you feel dizzy, take several deep breaths and lie down with your feet propped up, or bend forward with your head resting between your knees. You may get drowsy or dizzy. Do not drive, use machinery, or do anything that needs mental alertness until you know how this drug affects you. Do not stand or sit up quickly, especially if you are an older patient. This reduces the risk of dizzy or fainting spells. Alcohol can make you more drowsy and dizzy. Avoid alcoholic drinks. Do not treat yourself for coughs, colds, or pain while you are taking this medicine without asking your doctor or health care professional for advice. Some ingredients may increase your blood pressure. What side effects may I notice from receiving this medicine? Side effects that you should report to your doctor or health care professional as soon as possible:  blurred vision  dry mouth  skin rash  sweating  the feeling  of extreme pressure in the head  unusually weak or tired Side effects that usually do not require medical attention (report to your doctor or health care professional if they continue or are bothersome):  flushing of the face or neck  headache  irregular heartbeat, palpitations  nausea, vomiting This list may not describe all possible side effects. Call your doctor for medical advice about side effects. You may report side effects to FDA at 1-800-FDA-1088. Where should I keep my medicine? Keep out of the reach of children. Store at room temperature between 20 and 25 degrees C (68 and 77 degrees F). Store in [employer]. Protect from light and moisture. Keep tightly closed. Throw away any unused medicine after the expiration date. NOTE: This sheet is a summary. It may not cover all possible information. If you have questions about this medicine, talk to your doctor, pharmacist, or health care provider.  2020 Elsevier/Gold Standard (2013-05-30 17:57:36)    Signed, Buford Dresser, MD PhD 12/11/2019 4:14 PM    [facility]

## 2019-12-11 NOTE — Patient Instructions (Signed)
Medication Instructions:  Stop taking- Simvastatin 40 mg daily  Start taking- Rosuvastatin 20 mg daily           Aspirin 81 mg daily                      Nitroglycerin 0.4 mg 1 tablet (0.4 mg total) under the tongue every 5 (five) minutes as needed for chest pain. Do not use within 48 ours of using cialis  *If you need a refill on your cardiac medications before your next appointment, please call your pharmacy*   Lab Work: None    Testing/Procedures: None   Follow-Up: At [facility], you and your health needs are our priority.  As part of our continuing mission to provide you with exceptional heart care, we have created designated Provider Care Teams.  These Care Teams include your primary Cardiologist (physician) and Advanced Practice Providers (APPs -  Physician Assistants and Nurse Practitioners) who all work together to provide you with the care you need, when you need it.  We recommend signing up for the patient portal called "MyChart".  Sign up information is provided on this After Visit Summary.  MyChart is used to connect with patients for Virtual Visits (Telemedicine).  Patients are able to view lab/test results, encounter notes, upcoming appointments, etc.  Non-urgent messages can be sent to your provider as well.   To learn more about what you can do with MyChart, go to [email or web address].    Your next appointment:   3 month(s)  The format for your next appointment:   In Person  Provider:   Buford Dresser, MD  Nitroglycerin sublingual tablets What is this medicine? NITROGLYCERIN (nye troe GLI ser in) is a type of vasodilator. It relaxes blood vessels, increasing the blood and oxygen supply to your heart. This medicine is used to relieve chest pain caused by angina. It is also used to prevent chest pain before activities like climbing stairs, going outdoors in cold weather, or sexual activity. This medicine may be used for other purposes; ask your health  care provider or pharmacist if you have questions. COMMON BRAND NAME(S): Nitroquick, Nitrostat, Nitrotab What should I tell my health care provider before I take this medicine? They need to know if you have any of these conditions:  anemia  head injury, recent stroke, or bleeding in the brain  liver disease  previous heart attack  an unusual or allergic reaction to nitroglycerin, other medicines, foods, dyes, or preservatives  pregnant or trying to get pregnant  breast-feeding How should I use this medicine? Take this medicine by mouth as needed. At the first sign of an angina attack (chest pain or tightness) place one tablet under your tongue. You can also take this medicine 5 to 10 minutes before an event likely to produce chest pain. Follow the directions on the prescription label. Let the tablet dissolve under the tongue. Do not swallow whole. Replace the dose if you accidentally swallow it. It will help if your mouth is not dry. Saliva around the tablet will help it to dissolve more quickly. Do not eat or drink, smoke or chew tobacco while a tablet is dissolving. If you are not better within 5 minutes after taking ONE dose of nitroglycerin, call 9-1-1 immediately to seek emergency medical care. Do not take more than 3 nitroglycerin tablets over 15 minutes. If you take this medicine often to relieve symptoms of angina, your doctor or health care professional may provide you with  different instructions to manage your symptoms. If symptoms do not go away after following these instructions, it is important to call 9-1-1 immediately. Do not take more than 3 nitroglycerin tablets over 15 minutes. Talk to your pediatrician regarding the use of this medicine in children. Special care may be needed. Overdosage: If you think you have taken too much of this medicine contact a poison control center or emergency room at once. NOTE: This medicine is only for you. Do not share this medicine with  others. What if I miss a dose? This does not apply. This medicine is only used as needed. What may interact with this medicine? Do not take this medicine with any of the following medications:  certain migraine medicines like ergotamine and dihydroergotamine (DHE)  medicines used to treat erectile dysfunction like sildenafil, tadalafil, and vardenafil  riociguat This medicine may also interact with the following medications:  alteplase  aspirin  heparin  medicines for high blood pressure  medicines for mental depression  other medicines used to treat angina  phenothiazines like chlorpromazine, mesoridazine, prochlorperazine, thioridazine This list may not describe all possible interactions. Give your health care provider a list of all the medicines, herbs, non-prescription drugs, or dietary supplements you use. Also tell them if you smoke, drink alcohol, or use illegal drugs. Some items may interact with your medicine. What should I watch for while using this medicine? Tell your doctor or health care professional if you feel your medicine is no longer working. Keep this medicine with you at all times. Sit or lie down when you take your medicine to prevent falling if you feel dizzy or faint after using it. Try to remain calm. This will help you to feel better faster. If you feel dizzy, take several deep breaths and lie down with your feet propped up, or bend forward with your head resting between your knees. You may get drowsy or dizzy. Do not drive, use machinery, or do anything that needs mental alertness until you know how this drug affects you. Do not stand or sit up quickly, especially if you are an older patient. This reduces the risk of dizzy or fainting spells. Alcohol can make you more drowsy and dizzy. Avoid alcoholic drinks. Do not treat yourself for coughs, colds, or pain while you are taking this medicine without asking your doctor or health care professional for advice.  Some ingredients may increase your blood pressure. What side effects may I notice from receiving this medicine? Side effects that you should report to your doctor or health care professional as soon as possible:  blurred vision  dry mouth  skin rash  sweating  the feeling of extreme pressure in the head  unusually weak or tired Side effects that usually do not require medical attention (report to your doctor or health care professional if they continue or are bothersome):  flushing of the face or neck  headache  irregular heartbeat, palpitations  nausea, vomiting This list may not describe all possible side effects. Call your doctor for medical advice about side effects. You may report side effects to FDA at 1-800-FDA-1088. Where should I keep my medicine? Keep out of the reach of children. Store at room temperature between 20 and 25 degrees C (68 and 77 degrees F). Store in [employer]. Protect from light and moisture. Keep tightly closed. Throw away any unused medicine after the expiration date. NOTE: This sheet is a summary. It may not cover all possible information. If you have questions  about this medicine, talk to your doctor, pharmacist, or health care provider.  2020 Elsevier/Gold Standard (2013-05-30 17:57:36)

## 2019-12-18 ENCOUNTER — Encounter: Payer: Self-pay | Admitting: Cardiology

## 2019-12-18 DIAGNOSIS — J3081 Allergic rhinitis due to animal (cat) (dog) hair and dander: Secondary | ICD-10-CM | POA: Diagnosis not present

## 2019-12-18 DIAGNOSIS — J3089 Other allergic rhinitis: Secondary | ICD-10-CM | POA: Diagnosis not present

## 2019-12-18 DIAGNOSIS — J301 Allergic rhinitis due to pollen: Secondary | ICD-10-CM | POA: Diagnosis not present

## 2019-12-20 DIAGNOSIS — J301 Allergic rhinitis due to pollen: Secondary | ICD-10-CM | POA: Diagnosis not present

## 2019-12-20 DIAGNOSIS — J3089 Other allergic rhinitis: Secondary | ICD-10-CM | POA: Diagnosis not present

## 2019-12-20 DIAGNOSIS — J3081 Allergic rhinitis due to animal (cat) (dog) hair and dander: Secondary | ICD-10-CM | POA: Diagnosis not present

## 2019-12-26 ENCOUNTER — Other Ambulatory Visit: Payer: Self-pay | Admitting: Family Medicine

## 2019-12-26 DIAGNOSIS — J301 Allergic rhinitis due to pollen: Secondary | ICD-10-CM | POA: Diagnosis not present

## 2019-12-26 DIAGNOSIS — J3081 Allergic rhinitis due to animal (cat) (dog) hair and dander: Secondary | ICD-10-CM | POA: Diagnosis not present

## 2019-12-26 DIAGNOSIS — J3089 Other allergic rhinitis: Secondary | ICD-10-CM | POA: Diagnosis not present

## 2020-01-01 DIAGNOSIS — J301 Allergic rhinitis due to pollen: Secondary | ICD-10-CM | POA: Diagnosis not present

## 2020-01-01 DIAGNOSIS — J3089 Other allergic rhinitis: Secondary | ICD-10-CM | POA: Diagnosis not present

## 2020-01-01 DIAGNOSIS — J3081 Allergic rhinitis due to animal (cat) (dog) hair and dander: Secondary | ICD-10-CM | POA: Diagnosis not present

## 2020-01-08 DIAGNOSIS — J301 Allergic rhinitis due to pollen: Secondary | ICD-10-CM | POA: Diagnosis not present

## 2020-01-08 DIAGNOSIS — J3081 Allergic rhinitis due to animal (cat) (dog) hair and dander: Secondary | ICD-10-CM | POA: Diagnosis not present

## 2020-01-08 DIAGNOSIS — J3089 Other allergic rhinitis: Secondary | ICD-10-CM | POA: Diagnosis not present

## 2020-01-16 DIAGNOSIS — J3089 Other allergic rhinitis: Secondary | ICD-10-CM | POA: Diagnosis not present

## 2020-01-16 DIAGNOSIS — J3081 Allergic rhinitis due to animal (cat) (dog) hair and dander: Secondary | ICD-10-CM | POA: Diagnosis not present

## 2020-01-16 DIAGNOSIS — J301 Allergic rhinitis due to pollen: Secondary | ICD-10-CM | POA: Diagnosis not present

## 2020-01-19 ENCOUNTER — Encounter: Payer: Self-pay | Admitting: Cardiology

## 2020-01-19 DIAGNOSIS — Z8249 Family history of ischemic heart disease and other diseases of the circulatory system: Secondary | ICD-10-CM | POA: Insufficient documentation

## 2020-01-19 DIAGNOSIS — E7800 Pure hypercholesterolemia, unspecified: Secondary | ICD-10-CM | POA: Insufficient documentation

## 2020-01-19 DIAGNOSIS — I2510 Atherosclerotic heart disease of native coronary artery without angina pectoris: Secondary | ICD-10-CM | POA: Insufficient documentation

## 2020-01-22 DIAGNOSIS — J3081 Allergic rhinitis due to animal (cat) (dog) hair and dander: Secondary | ICD-10-CM | POA: Diagnosis not present

## 2020-01-22 DIAGNOSIS — J3089 Other allergic rhinitis: Secondary | ICD-10-CM | POA: Diagnosis not present

## 2020-01-22 DIAGNOSIS — J301 Allergic rhinitis due to pollen: Secondary | ICD-10-CM | POA: Diagnosis not present

## 2020-01-31 DIAGNOSIS — J301 Allergic rhinitis due to pollen: Secondary | ICD-10-CM | POA: Diagnosis not present

## 2020-01-31 DIAGNOSIS — J3081 Allergic rhinitis due to animal (cat) (dog) hair and dander: Secondary | ICD-10-CM | POA: Diagnosis not present

## 2020-01-31 DIAGNOSIS — J3089 Other allergic rhinitis: Secondary | ICD-10-CM | POA: Diagnosis not present

## 2020-02-04 ENCOUNTER — Other Ambulatory Visit: Payer: Self-pay | Admitting: Family Medicine

## 2020-02-04 DIAGNOSIS — N401 Enlarged prostate with lower urinary tract symptoms: Secondary | ICD-10-CM

## 2020-02-04 MED ORDER — TADALAFIL 5 MG PO TABS: 5.0000 mg | ORAL_TABLET | Freq: Every day | ORAL | 0 refills | Status: DC

## 2020-02-05 DIAGNOSIS — J3081 Allergic rhinitis due to animal (cat) (dog) hair and dander: Secondary | ICD-10-CM | POA: Diagnosis not present

## 2020-02-05 DIAGNOSIS — J301 Allergic rhinitis due to pollen: Secondary | ICD-10-CM | POA: Diagnosis not present

## 2020-02-05 DIAGNOSIS — J3089 Other allergic rhinitis: Secondary | ICD-10-CM | POA: Diagnosis not present

## 2020-02-12 DIAGNOSIS — J301 Allergic rhinitis due to pollen: Secondary | ICD-10-CM | POA: Diagnosis not present

## 2020-02-12 DIAGNOSIS — J3081 Allergic rhinitis due to animal (cat) (dog) hair and dander: Secondary | ICD-10-CM | POA: Diagnosis not present

## 2020-02-12 DIAGNOSIS — J3089 Other allergic rhinitis: Secondary | ICD-10-CM | POA: Diagnosis not present

## 2020-02-19 ENCOUNTER — Other Ambulatory Visit: Payer: Self-pay

## 2020-02-19 ENCOUNTER — Encounter: Payer: Self-pay | Admitting: Family Medicine

## 2020-02-19 ENCOUNTER — Ambulatory Visit (INDEPENDENT_AMBULATORY_CARE_PROVIDER_SITE_OTHER): Payer: PPO | Admitting: Family Medicine

## 2020-02-19 ENCOUNTER — Other Ambulatory Visit (INDEPENDENT_AMBULATORY_CARE_PROVIDER_SITE_OTHER): Payer: PPO

## 2020-02-19 VITALS — BP 90/60 | HR 51 | Temp 97.8°F | Ht 68.75 in | Wt 161.7 lb

## 2020-02-19 DIAGNOSIS — K219 Gastro-esophageal reflux disease without esophagitis: Secondary | ICD-10-CM

## 2020-02-19 DIAGNOSIS — E7800 Pure hypercholesterolemia, unspecified: Secondary | ICD-10-CM

## 2020-02-19 DIAGNOSIS — Z0000 Encounter for general adult medical examination without abnormal findings: Secondary | ICD-10-CM

## 2020-02-19 DIAGNOSIS — I2510 Atherosclerotic heart disease of native coronary artery without angina pectoris: Secondary | ICD-10-CM | POA: Diagnosis not present

## 2020-02-19 DIAGNOSIS — N529 Male erectile dysfunction, unspecified: Secondary | ICD-10-CM | POA: Diagnosis not present

## 2020-02-19 DIAGNOSIS — R739 Hyperglycemia, unspecified: Secondary | ICD-10-CM | POA: Diagnosis not present

## 2020-02-19 DIAGNOSIS — R252 Cramp and spasm: Secondary | ICD-10-CM

## 2020-02-19 DIAGNOSIS — R42 Dizziness and giddiness: Secondary | ICD-10-CM | POA: Diagnosis not present

## 2020-02-19 DIAGNOSIS — M109 Gout, unspecified: Secondary | ICD-10-CM | POA: Diagnosis not present

## 2020-02-19 DIAGNOSIS — Z23 Encounter for immunization: Secondary | ICD-10-CM | POA: Diagnosis not present

## 2020-02-19 LAB — COMPREHENSIVE METABOLIC PANEL
ALT: 20 U/L (ref 0–53)
AST: 26 U/L (ref 0–37)
Albumin: 4 g/dL (ref 3.5–5.2)
Alkaline Phosphatase: 53 U/L (ref 39–117)
BUN: 19 mg/dL (ref 6–23)
CO2: 28 mEq/L (ref 19–32)
Calcium: 8.9 mg/dL (ref 8.4–10.5)
Chloride: 103 mEq/L (ref 96–112)
Creatinine, Ser: 0.81 mg/dL (ref 0.40–1.50)
GFR: 95.42 mL/min (ref >60.00)
Glucose, Bld: 90 mg/dL (ref 70–99)
Potassium: 4 mEq/L (ref 3.5–5.1)
Sodium: 139 mEq/L (ref 135–145)
Total Bilirubin: 0.9 mg/dL (ref 0.2–1.2)
Total Protein: 6.7 g/dL (ref 6.0–8.3)

## 2020-02-19 LAB — CBC WITH DIFFERENTIAL/PLATELET
Basophils Absolute: 0.1 10*3/uL (ref 0.0–0.1)
Basophils Relative: 0.8 % (ref 0.0–3.0)
Eosinophils Absolute: 0.1 10*3/uL (ref 0.0–0.7)
Eosinophils Relative: 2.1 % (ref 0.0–5.0)
HCT: 42.4 % (ref 39.0–52.0)
Hemoglobin: 14 g/dL (ref 13.0–17.0)
Lymphocytes Relative: 15 % (ref 12.0–46.0)
Lymphs Abs: 1 10*3/uL (ref 0.7–4.0)
MCHC: 32.9 g/dL (ref 30.0–36.0)
MCV: 89.1 fl (ref 78.0–100.0)
Monocytes Absolute: 0.5 10*3/uL (ref 0.1–1.0)
Monocytes Relative: 7.5 % (ref 3.0–12.0)
Neutro Abs: 4.7 10*3/uL (ref 1.4–7.7)
Neutrophils Relative %: 74.6 % (ref 43.0–77.0)
Platelets: 232 10*3/uL (ref 150.0–400.0)
RBC: 4.76 Mil/uL (ref 4.22–5.81)
RDW: 16.4 % — ABNORMAL HIGH (ref 11.5–15.5)
WBC: 6.4 10*3/uL (ref 4.0–10.5)

## 2020-02-19 LAB — CK: Total CK: 109 U/L (ref 44–196)

## 2020-02-19 LAB — LIPID PANEL
Cholesterol: 157 mg/dL (ref 0–200)
HDL: 68.7 mg/dL (ref >39.00)
LDL Cholesterol: 75 mg/dL (ref 0–99)
NonHDL: 87.88
Total CHOL/HDL Ratio: 2
Triglycerides: 65 mg/dL (ref 0.0–149.0)
VLDL: 13 mg/dL (ref 0.0–40.0)

## 2020-02-19 LAB — HEMOGLOBIN A1C: Hgb A1c MFr Bld: 5.8 % (ref 4.6–6.5)

## 2020-02-19 LAB — URIC ACID: Uric Acid, Serum: 3.5 mg/dL — ABNORMAL LOW (ref 4.0–7.8)

## 2020-02-19 MED ORDER — SHINGRIX 50 MCG/0.5ML IM SUSR: 0.5000 mL | Freq: Once | INTRAMUSCULAR | 0 refills | Status: AC

## 2020-02-19 NOTE — Progress Notes (Signed)
Stanley Bennett DOB: 14-Jul-1956 Encounter date: 02/19/2020  This is a 64 y.o. male who presents for complete physical   History of present illness/Additional concerns:  Saw cardiology, Dr. Harrell Gave.  Statin was changed to Crestor 20 mg. No bad side effects that he has noted with this. Has noted some cramping or "precramping" at night when he lays down. No cramps during day.   GERD:hx hiatal hernia, protonix 90m, unable to wean off. Upper endoscopy 05/2017 (Dr. PHenrene Pastor- normal. Has been persistent for as long as he can remember.  Last colonoscopy in October/2018: Recommendation was for repeat in 3 years.  Gout:on allopurinol - hasn't had bad flare up in quite awhile, If he stops taking the allopurinol he gets pain, stiffness in great toe. Every so often during day gets pain from big toe right foot across other toes. Gets stiff, tingling (painful) and then resolves. Usually after more activity. Bothers more by afternoon.   HL: on crestor Regular exercise (walking) 6-8 miles daily. Used to be [place]   Allergies: follows with Dr. WRemus Blakeand still getting allergy shots 2/week. Also using the flonase and zyrtec.   Bilat knee pain - better with activity. Saw ortho 2017. Now having right pain down into right knee. Takes advil about 4 nights/week. Feels it during day but not as significant as it is during night. Has had lower back and knee issues in past, but in last 2 months has been a pretty consistent hip pain. Radiates to knee. Occasionally pain into groin, but feels on side of hip. Not tender to touch. Takes a few strides to get back into normal feel. Advil does help with pain. Would be interested at some point at further evaluation. States that it hasn't gone away since last visit. Did complete therapy across stress - increased flexibility but not pain. Advil helps.   Ed: sildenafil/cialis - cialis   Hx of elevated glucose with labwork; wnl (77) on last bmp 11/2017.    Follows with derm due to basal cell on nose. This was removed and does have regular follow ups with them for recheck.  Past Medical History:  Diagnosis Date   Allergy    Basal cell carcinoma    scalp   Gout    Hyperlipidemia    Past Surgical History:  Procedure Laterality Date   APPENDECTOMY     BASAL CELL CARCINOMA EXCISION     CERVICAL DISC SURGERY     TONSILLECTOMY     Allergies  Allergen Reactions   Tamiflu [Oseltamivir] Rash    Mild rash 2018   Current Meds  Medication Sig   allopurinol (ZYLOPRIM) 300 MG tablet TAKE 1 TABLET(300 MG) BY MOUTH DAILY AS DIRECTED   aspirin EC 81 MG tablet Take 1 tablet (81 mg total) by mouth daily.   Cetirizine HCl (KLS ALLER-TEC PO) Take by mouth.   fluticasone (FLONASE) 50 MCG/ACT nasal spray Place 1 spray into both nostrils 2 (two) times daily.    nitroGLYCERIN (NITROSTAT) 0.4 MG SL tablet Place 1 tablet (0.4 mg total) under the tongue every 5 (five) minutes as needed for chest pain. Do not use within 48 ours of using cialis.   pantoprazole (PROTONIX) 20 MG tablet TAKE 1 TABLET(20 MG) BY MOUTH DAILY BEFORE BREAKFAST   PRESCRIPTION MEDICATION Allergy shots every week   rosuvastatin (CRESTOR) 20 MG tablet Take 1 tablet (20 mg total) by mouth daily.   tadalafil (CIALIS) 5 MG tablet Take 1 tablet (5 mg total) by mouth  daily.   Social History   Tobacco Use   Smoking status: Never Smoker   Smokeless tobacco: Never Used  Substance Use Topics   Alcohol use: Yes    Alcohol/week: 6.0 standard drinks    Types: 6 Cans of beer per week    Comment: 2 - 5 days per week   Family History  Problem Relation Age of Onset   Arthritis Mother        osteo   Arthritis Father        osteo   Heart disease Father        (pneumonia cause of death 7 yrs)   Cancer Sister        ovarian   Diabetes Sister    Kidney failure Cousin        pat cousin   Other Brother        celiac sprue   Kidney failure Cousin         pat cousin   Colon cancer Neg Hx    Esophageal cancer Neg Hx    Rectal cancer Neg Hx      Review of Systems  Constitutional: Negative for activity change, appetite change, chills, fatigue, fever and unexpected weight change.  HENT: Negative for congestion, ear pain, hearing loss, sinus pressure, sinus pain, sore throat and trouble swallowing.   Eyes: Negative for pain and visual disturbance.  Respiratory: Negative for cough, chest tightness, shortness of breath and wheezing.   Cardiovascular: Negative for chest pain, palpitations and leg swelling.  Gastrointestinal: Negative for abdominal distention, abdominal pain, blood in stool, constipation, diarrhea, nausea and vomiting.  Genitourinary: Negative for decreased urine volume, difficulty urinating, dysuria, penile pain and testicular pain.  Musculoskeletal: Positive for arthralgias, back pain (intermittent) and myalgias (mild in calves at night). Negative for joint swelling.  Skin: Negative for rash.  Neurological: Positive for dizziness (has had increased vertigo. comes with head turning, but also notes when lying down in bed at night. has had some issues with balance while walking; intermittently). Negative for weakness, numbness and headaches.  Hematological: Negative for adenopathy. Does not bruise/bleed easily.  Psychiatric/Behavioral: Negative for agitation, sleep disturbance and suicidal ideas. The patient is not nervous/anxious.     CBC:  Lab Results  Component Value Date   WBC 5.8 03/06/2017   HGB 13.0 03/06/2017   HCT 40.1 03/06/2017   MCHC 32.4 03/06/2017   RDW 17.4 (H) 03/06/2017   PLT 291.0 03/06/2017   CMP: Lab Results  Component Value Date   NA 139 10/25/2019   K 4.5 10/25/2019   CL 104 10/25/2019   CO2 25 10/25/2019   GLUCOSE 62 (L) 10/25/2019   BUN 16 10/25/2019   CREATININE 0.92 10/25/2019   GFRAA  07/02/2009    >60        The eGFR has been calculated using the MDRD equation. This calculation has  not been validated in all clinical situations. eGFR's persistently <60 mL/min signify possible Chronic Kidney Disease.   CALCIUM 9.2 10/25/2019   PROT 7.0 01/02/2019   BILITOT 0.9 01/02/2019   ALKPHOS 68 01/02/2019   ALT 20 01/02/2019   AST 24 01/02/2019   LIPID: Lab Results  Component Value Date   CHOL 186 01/02/2019   TRIG 104.0 01/02/2019   HDL 80.00 01/02/2019   LDLCALC 86 01/02/2019    Objective:  BP 90/60 (BP Location: Left Arm, Patient Position: Sitting, Cuff Size: Normal)    Pulse (!) 51    Temp 97.8 F (36.6  C) (Temporal)    Ht 5' 8.75" (1.746 m)    Wt 161 lb 11.2 oz (73.3 kg)    BMI 24.05 kg/m   Weight: 161 lb 11.2 oz (73.3 kg)   BP Readings from Last 3 Encounters:  02/19/20 90/60  12/11/19 (!) 100/54  12/03/19 112/65   Wt Readings from Last 3 Encounters:  02/19/20 161 lb 11.2 oz (73.3 kg)  12/11/19 171 lb (77.6 kg)  10/29/19 175 lb (79.4 kg)    Physical Exam Constitutional:      General: He is not in acute distress.    Appearance: He is well-developed.  HENT:     Head: Normocephalic and atraumatic.     Right Ear: External ear normal.     Left Ear: External ear normal.     Nose: Nose normal.     Mouth/Throat:     Pharynx: No oropharyngeal exudate.  Eyes:     Conjunctiva/sclera: Conjunctivae normal.     Pupils: Pupils are equal, round, and reactive to light.  Neck:     Thyroid: No thyromegaly.  Cardiovascular:     Rate and Rhythm: Normal rate and regular rhythm.     Heart sounds: Normal heart sounds. No murmur heard.  No friction rub. No gallop.   Pulmonary:     Effort: Pulmonary effort is normal. No respiratory distress.     Breath sounds: Normal breath sounds. No stridor. No wheezing or rales.  Abdominal:     General: Bowel sounds are normal.     Palpations: Abdomen is soft.  Musculoskeletal:        General: Normal range of motion.     Cervical back: Neck supple.  Skin:    General: Skin is warm and dry.  Neurological:     Mental  Status: He is alert and oriented to person, place, and time.     Cranial Nerves: Cranial nerves are intact.     Motor: Motor function is intact.     Coordination: Romberg sign positive (slight imbalance with romberg). Coordination normal. Rapid alternating movements normal.     Gait: Gait is intact. Gait and tandem walk normal.     Deep Tendon Reflexes:     Reflex Scores:      Bicep reflexes are 2+ on the right side and 2+ on the left side.      Brachioradialis reflexes are 2+ on the right side and 2+ on the left side.      Patellar reflexes are 2+ on the right side and 2+ on the left side.      Achilles reflexes are 2+ on the right side and 2+ on the left side. Psychiatric:        Behavior: Behavior normal.        Thought Content: Thought content normal.        Judgment: Judgment normal.     Assessment/Plan: There are no preventive care reminders to display for this patient. Health Maintenance reviewed - penumovax given, shingrix ordered to pharmacy. Due for colonoscopy in October 2021 (patient aware).   1. Preventative health care Keep up with healthy lifestyle, regular activity. - Pneumococcal polysaccharide vaccine 23-valent greater than or equal to 2yo subcutaneous/IM; Future - Zoster Vaccine Adjuvanted [facility]) injection; Inject 0.5 mLs into the muscle once for 1 dose. Repeat in 2-6 months  Dispense: 0.5 mL; Refill: 0  2. Gastroesophageal reflux disease, unspecified whether esophagitis present Controlled with current protonix dose. Has been unable to wean off.  3. Nonobstructive atherosclerosis  of coronary artery Tolerating crestor; uncertain if leg discomfort is related. Will start with bloodwork and consider follow up pending these results.   4. Gout, unspecified cause, unspecified chronicity, unspecified site Has been stable on allopurinol, but now with increased foot pain. - Uric acid; Future  5. Erectile dysfunction, unspecified erectile dysfunction type Does well  with cialis.  6. Pure hypercholesterolemia - Comprehensive metabolic panel; Future - Lipid panel; Future  7. Leg cramps See above - CBC with Differential/Platelet; Future - CK; Future  8. Hyperglycemia - Hemoglobin A1c; Future  9. Vertigo I am concerned with increase in episodes over last few months as well as intermittent balance changes. Since no prior imaging, and although vertigo does sound more positional, I think it is worthwhile to further investigate.  - MR BRAIN WO CONTRAST; Future  Return for pending lab results,imaging.  Micheline Rough, MD

## 2020-02-20 ENCOUNTER — Encounter: Payer: Self-pay | Admitting: Family Medicine

## 2020-02-20 DIAGNOSIS — J301 Allergic rhinitis due to pollen: Secondary | ICD-10-CM | POA: Diagnosis not present

## 2020-02-20 DIAGNOSIS — J3081 Allergic rhinitis due to animal (cat) (dog) hair and dander: Secondary | ICD-10-CM | POA: Diagnosis not present

## 2020-02-20 DIAGNOSIS — J3089 Other allergic rhinitis: Secondary | ICD-10-CM | POA: Diagnosis not present

## 2020-03-09 DIAGNOSIS — J301 Allergic rhinitis due to pollen: Secondary | ICD-10-CM | POA: Diagnosis not present

## 2020-03-09 DIAGNOSIS — J3089 Other allergic rhinitis: Secondary | ICD-10-CM | POA: Diagnosis not present

## 2020-03-09 DIAGNOSIS — J3081 Allergic rhinitis due to animal (cat) (dog) hair and dander: Secondary | ICD-10-CM | POA: Diagnosis not present

## 2020-03-16 ENCOUNTER — Other Ambulatory Visit: Payer: Self-pay

## 2020-03-16 ENCOUNTER — Ambulatory Visit
Admission: RE | Admit: 2020-03-16 | Discharge: 2020-03-16 | Disposition: A | Discharge status: Home / Self Care | Payer: PPO | Source: Ambulatory Visit | Attending: Family Medicine | Admitting: Family Medicine

## 2020-03-16 DIAGNOSIS — R42 Dizziness and giddiness: Secondary | ICD-10-CM

## 2020-03-16 DIAGNOSIS — R9082 White matter disease, unspecified: Secondary | ICD-10-CM | POA: Diagnosis not present

## 2020-03-16 DIAGNOSIS — I6782 Cerebral ischemia: Secondary | ICD-10-CM | POA: Diagnosis not present

## 2020-03-16 DIAGNOSIS — J320 Chronic maxillary sinusitis: Secondary | ICD-10-CM | POA: Diagnosis not present

## 2020-03-17 ENCOUNTER — Other Ambulatory Visit: Payer: Self-pay | Admitting: *Deleted

## 2020-03-17 DIAGNOSIS — R42 Dizziness and giddiness: Secondary | ICD-10-CM

## 2020-03-17 DIAGNOSIS — J320 Chronic maxillary sinusitis: Secondary | ICD-10-CM

## 2020-03-22 ENCOUNTER — Other Ambulatory Visit: Payer: Self-pay | Admitting: Family Medicine

## 2020-03-24 DIAGNOSIS — J301 Allergic rhinitis due to pollen: Secondary | ICD-10-CM | POA: Diagnosis not present

## 2020-03-24 DIAGNOSIS — J3089 Other allergic rhinitis: Secondary | ICD-10-CM | POA: Diagnosis not present

## 2020-03-24 DIAGNOSIS — J3081 Allergic rhinitis due to animal (cat) (dog) hair and dander: Secondary | ICD-10-CM | POA: Diagnosis not present

## 2020-03-25 ENCOUNTER — Ambulatory Visit: Payer: PPO | Admitting: Cardiology

## 2020-03-25 ENCOUNTER — Other Ambulatory Visit: Payer: Self-pay

## 2020-03-25 ENCOUNTER — Encounter: Payer: Self-pay | Admitting: Cardiology

## 2020-03-25 VITALS — BP 90/60 | HR 58 | Ht 69.0 in | Wt 162.8 lb

## 2020-03-25 DIAGNOSIS — Z8249 Family history of ischemic heart disease and other diseases of the circulatory system: Secondary | ICD-10-CM

## 2020-03-25 DIAGNOSIS — I493 Ventricular premature depolarization: Secondary | ICD-10-CM | POA: Diagnosis not present

## 2020-03-25 DIAGNOSIS — I2510 Atherosclerotic heart disease of native coronary artery without angina pectoris: Secondary | ICD-10-CM | POA: Diagnosis not present

## 2020-03-25 DIAGNOSIS — Z7189 Other specified counseling: Secondary | ICD-10-CM | POA: Diagnosis not present

## 2020-03-25 DIAGNOSIS — E7800 Pure hypercholesterolemia, unspecified: Secondary | ICD-10-CM

## 2020-03-25 NOTE — Progress Notes (Signed)
Cardiology Office Note:    Date:  03/25/2020   ID:  Stanley Bennett, DOB November 29, 1955, MRN [id number]  PCP:  Caren Macadam, MD  Cardiologist:  Buford Dresser, MD  Referring MD: Caren Macadam, MD   Chief Complaint  Patient presents with  . Follow-up    History of Present Illness:    Stanley Bennett is a 64 y.o. male with a hx of hyperlipidemia, family history of heart disease who is seen for follow up. I initially met him 10/29/19 as a new consult at the request of Koberlein, Junell C, MD for the evaluation and management of chest discomfort and variable heart rate.  Today: Doing well overall. No events. Has changed diet a lot, misses some of the smoked meats etc, but doing well with a more vegetarian focused/healthy diet. Remains very active. Got 30,000 steps/day last week. Walks 6.5 miles daily, can hike additional 5 miles on some days. 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   Taking aspirin every day, no bleeding issues. Occasional bruising, mild. Has not needed nitro.   Tolerating rosuvastatin. Has some cramps at night in his legs, but these are mild and were present prior to the crestor use.  Reviewed monitor results.  Denies chest pain, shortness of breath at rest or with normal exertion. No PND, orthopnea, LE edema or unexpected weight gain. No syncope or palpitations.   Past Medical History:  Diagnosis Date  . Allergy   . Basal cell carcinoma    scalp  . Gout   . Hyperlipidemia     Past Surgical History:  Procedure Laterality Date  . APPENDECTOMY    . BASAL CELL CARCINOMA EXCISION    . CERVICAL DISC SURGERY    . TONSILLECTOMY      Current Medications: Current Outpatient Medications on File Prior to Visit  Medication Sig  . allopurinol (ZYLOPRIM) 300 MG tablet TAKE 1 TABLET(300 MG) BY MOUTH DAILY AS DIRECTED  . aspirin EC 81 MG tablet Take 1 tablet (81 mg total) by mouth daily.  . Cetirizine HCl (KLS ALLER-TEC PO) Take by mouth.  .  fluticasone (FLONASE) 50 MCG/ACT nasal spray Place 1 spray into both nostrils 2 (two) times daily.   . pantoprazole (PROTONIX) 20 MG tablet TAKE 1 TABLET(20 MG) BY MOUTH DAILY BEFORE BREAKFAST  . PRESCRIPTION MEDICATION Allergy shots every week  . rosuvastatin (CRESTOR) 20 MG tablet Take 20 mg by mouth daily.  . tadalafil (CIALIS) 5 MG tablet Take 1 tablet (5 mg total) by mouth daily.  . nitroGLYCERIN (NITROSTAT) 0.4 MG SL tablet Place 1 tablet (0.4 mg total) under the tongue every 5 (five) minutes as needed for chest pain. Do not use within 48 ours of using cialis.   No current facility-administered medications on file prior to visit.     Allergies:   Tamiflu [oseltamivir]   Social History   Tobacco Use  . Smoking status: Never Smoker  . Smokeless tobacco: Never Used  Vaping Use  . Vaping Use: Never used  Substance Use Topics  . Alcohol use: Yes    Alcohol/week: 6.0 standard drinks    Types: 6 Cans of beer per week    Comment: 2 - 5 days per week  . Drug use: No    Family History: family history includes Arthritis in his father and mother; Cancer in his sister; Diabetes in his sister; Heart disease in his father; Kidney failure in his cousin and cousin; Other in his brother. There is no history  of Colon cancer, Esophageal cancer, or Rectal cancer.  father had CABG at 7 years old. Sister just had a stent placed in her late 23s. They were both less active than he is.  ROS:   Please see the history of present illness.  Additional pertinent ROS otherwise unremarkable.   EKGs/Labs/Other Studies Reviewed:    The following studies were reviewed today: Monitor 12/18/19 14 days of data recorded on Zio monitor. Patient had a min HR of 39 bpm, max HR of 148 bpm, and avg HR of 58 bpm. Predominant underlying rhythm was Sinus Rhythm. No VT, atrial fibrillation, high degree block, or pauses noted. Isolated atrial ectopy was rare (<1%), and isolated ventricular ectopy was occasional (2.6%).  There were 5 triggered events, which were sinus with/without PVCs. 4 Supraventricular Tachycardia runs occurred, the run with the fastest interval lasting 5 beats with a max rate of 148 bpm, the longest lasting 9 beats with an avg rate of 107 bpm.   Cardiac CT 12/03/19 FINDINGS: Coronary calcium score: The patient's coronary artery calcium score is 395, which places the patient in the 79th percentile.  Coronary arteries: Normal coronary origins.  Right dominance.  Right Coronary Artery: Normal caliber vessel, gives rise to PDA. Proximal RCA with focal calcified plaque with 25-49% stenosis. There is a mixed calcified and noncalcified plaque in the mid RCA with 1-24% stenosis. There are several small, focal calcified plaques in the distal RCA with 1-24% stenosis.  Left Main Coronary Artery: Normal caliber vessel. No significant plaque or stenosis. Small caliber ramus intermedius branch.  Left Anterior Descending Coronary Artery: Normal caliber vessel. Proximal portion with mixed calcified and noncalcified plaque with 25-49% stenosis. Mid LAD with mixed plaque, appears to be 25-49% stenosis but cannot exclude higher degree given calcium blooming. Distal LAD with calcified plaque and 1-24% stenosis. LAD gives rise to 4 small diagonal branches.  Left Circumflex Artery: Normal caliber vessel. No significant plaque or stenosis. Gives rise to 2 OM branches.  Aorta: Normal size, 33 mm at the mid ascending aorta (level of the PA bifurcation) measured double oblique. No calcifications. No dissection.  Aortic Valve: No calcifications. Trileaflet.  Other findings:  Normal pulmonary vein drainage into the left atrium.  Normal left atrial appendage without a thrombus.  Normal size of the pulmonary artery.  IMPRESSION: 1. Mild nonobstructive CAD, CADRADS = 2. However, there is one lesion in the mid LAD that cannot be fully visualized due to calcium and may have higher degree  stenosis. CT FFR will be performed and reported separately.  2. Coronary calcium score of 395. This was 79th percentile for age and sex matched control.  3. Normal coronary origin with right dominance.  FFR negative for significant stenosis.  EKG:  EKG is personally reviewed.  The ekg ordered 10/29/19 demonstrates sinus bradycardia at 53 bpm  Recent Labs: 10/25/2019: Magnesium 2.0; TSH 3.27 02/19/2020: ALT 20; BUN 19; Creatinine, Ser 0.81; Hemoglobin 14.0; Platelets 232.0; Potassium 4.0; Sodium 139  Recent Lipid Panel    Component Value Date/Time   CHOL 157 02/19/2020 1104   TRIG 65.0 02/19/2020 1104   HDL 68.70 02/19/2020 1104   CHOLHDL 2 02/19/2020 1104   VLDL 13.0 02/19/2020 1104   LDLCALC 75 02/19/2020 1104    Physical Exam:    VS:  BP 90/60 (BP Location: Left Arm, Patient Position: Sitting, Cuff Size: Normal)   Pulse (!) 58   Ht 5' 9"  (1.753 m)   Wt 162 lb 12.8 oz (73.8 kg)  SpO2 98%   BMI 24.04 kg/m     Wt Readings from Last 3 Encounters:  03/25/20 162 lb 12.8 oz (73.8 kg)  02/19/20 161 lb 11.2 oz (73.3 kg)  12/11/19 171 lb (77.6 kg)    GEN: Well nourished, well developed in no acute distress HEENT: Normal, moist mucous membranes NECK: No JVD CARDIAC: regular rhythm, normal S1 and S2, no rubs or gallops. No murmur. VASCULAR: Radial and DP pulses 2+ bilaterally. No carotid bruits RESPIRATORY:  Clear to auscultation without rales, wheezing or rhonchi  ABDOMEN: Soft, non-tender, non-distended MUSCULOSKELETAL:  Ambulates independently SKIN: Warm and dry, no edema NEUROLOGIC:  Alert and oriented x 3. No focal neuro deficits noted. PSYCHIATRIC:  Normal affect   ASSESSMENT:    1. Nonobstructive atherosclerosis of coronary artery   2. Family history of heart disease   3. PVC (premature ventricular contraction)   4. Pure hypercholesterolemia   5. Cardiac risk counseling   6. Counseling on health promotion and disease prevention    PLAN:    Nonobstructive  CAD with prior chest pain:  -no further symptoms -tolerating aspirin 81 mg daily -tolerating rosuvastatin 20 mg daily -no beta blocker as he is in sinus bradycardia at baseline -prescribed sublingual nitroglycerin and instructed on use today. Counseled extensively on risk of NG and cialis. -counseled on red flag warning signs that need immediate medical attention  Occasional PVCs on monitor: -burden 2.6% -also rare/brief (<10 beats) SVT -reviewed today. No treatment required.  Hypercholesterolemia: -tolerating rosuvastatin 20 mg daily -lipids 02/19/20: Tchol 157, HDL 68, LDL 75, TG 65 -has made further lifestyle changes. Goal LDL <70, but ok to continue to focus on diet/lifestyle to get to goal for now  Family history of heart disease: secondary prevention as below  Cardiac risk counseling and prevention recommendations: -recommend heart healthy/Mediterranean diet, with whole grains, fruits, vegetable, fish, lean meats, nuts, and olive oil. Limit salt. -recommend moderate walking, 3-5 times/week for 30-50 minutes each session. Aim for at least 150 minutes.week. Goal should be pace of 3 miles/hours, or walking 1.5 miles in 30 minutes -recommend avoidance of tobacco products. Avoid excess alcohol.  Plan for follow up: 1 year or sooner as needed  Buford Dresser, MD, PhD [place]  [facility]   Medication Adjustments/Labs and Tests Ordered: Current medicines are reviewed at length with the patient today.  Concerns regarding medicines are outlined above.  No orders of the defined types were placed in this encounter.  No orders of the defined types were placed in this encounter.   Patient Instructions  Medication Instructions:  Your Physician recommend you continue on your current medication as directed.    *If you need a refill on your cardiac medications before your next appointment, please call your pharmacy*   Lab  Work: None   Testing/Procedures: None   Follow-Up: At [facility], you and your health needs are our priority.  As part of our continuing mission to provide you with exceptional heart care, we have created designated Provider Care Teams.  These Care Teams include your primary Cardiologist (physician) and Advanced Practice Providers (APPs -  Physician Assistants and Nurse Practitioners) who all work together to provide you with the care you need, when you need it.  We recommend signing up for the patient portal called "MyChart".  Sign up information is provided on this After Visit Summary.  MyChart is used to connect with patients for Virtual Visits (Telemedicine).  Patients are able to view lab/test results, encounter notes,  upcoming appointments, etc.  Non-urgent messages can be sent to your provider as well.   To learn more about what you can do with MyChart, go to [email or web address].    Your next appointment:   1 year(s)  The format for your next appointment:   In Person  Provider:   Buford Dresser, MD       Signed, Buford Dresser, MD PhD 03/25/2020  [place]

## 2020-03-25 NOTE — Patient Instructions (Signed)

## 2020-03-26 ENCOUNTER — Encounter: Payer: Self-pay | Admitting: Cardiology

## 2020-03-30 DIAGNOSIS — J3081 Allergic rhinitis due to animal (cat) (dog) hair and dander: Secondary | ICD-10-CM | POA: Diagnosis not present

## 2020-03-30 DIAGNOSIS — J3089 Other allergic rhinitis: Secondary | ICD-10-CM | POA: Diagnosis not present

## 2020-03-30 DIAGNOSIS — J301 Allergic rhinitis due to pollen: Secondary | ICD-10-CM | POA: Diagnosis not present

## 2020-04-08 DIAGNOSIS — J301 Allergic rhinitis due to pollen: Secondary | ICD-10-CM | POA: Diagnosis not present

## 2020-04-08 DIAGNOSIS — J3081 Allergic rhinitis due to animal (cat) (dog) hair and dander: Secondary | ICD-10-CM | POA: Diagnosis not present

## 2020-04-08 DIAGNOSIS — J3089 Other allergic rhinitis: Secondary | ICD-10-CM | POA: Diagnosis not present

## 2020-04-13 DIAGNOSIS — J301 Allergic rhinitis due to pollen: Secondary | ICD-10-CM | POA: Diagnosis not present

## 2020-04-13 DIAGNOSIS — J3089 Other allergic rhinitis: Secondary | ICD-10-CM | POA: Diagnosis not present

## 2020-04-13 DIAGNOSIS — J3081 Allergic rhinitis due to animal (cat) (dog) hair and dander: Secondary | ICD-10-CM | POA: Diagnosis not present

## 2020-04-14 DIAGNOSIS — Z20822 Contact with and (suspected) exposure to covid-19: Secondary | ICD-10-CM | POA: Diagnosis not present

## 2020-04-23 ENCOUNTER — Other Ambulatory Visit: Payer: Self-pay | Admitting: Family Medicine

## 2020-04-23 DIAGNOSIS — J301 Allergic rhinitis due to pollen: Secondary | ICD-10-CM | POA: Diagnosis not present

## 2020-04-23 DIAGNOSIS — R3912 Poor urinary stream: Secondary | ICD-10-CM

## 2020-04-23 DIAGNOSIS — N401 Enlarged prostate with lower urinary tract symptoms: Secondary | ICD-10-CM

## 2020-04-23 DIAGNOSIS — J3081 Allergic rhinitis due to animal (cat) (dog) hair and dander: Secondary | ICD-10-CM | POA: Diagnosis not present

## 2020-04-23 DIAGNOSIS — J3089 Other allergic rhinitis: Secondary | ICD-10-CM | POA: Diagnosis not present

## 2020-04-24 MED ORDER — TADALAFIL 5 MG PO TABS: 5.0000 mg | ORAL_TABLET | Freq: Every day | ORAL | 0 refills | Status: DC

## 2020-05-05 ENCOUNTER — Encounter: Payer: Self-pay | Admitting: Family Medicine

## 2020-05-11 ENCOUNTER — Ambulatory Visit (INDEPENDENT_AMBULATORY_CARE_PROVIDER_SITE_OTHER): Payer: PPO | Admitting: Otolaryngology

## 2020-05-11 ENCOUNTER — Other Ambulatory Visit: Payer: Self-pay

## 2020-05-11 ENCOUNTER — Encounter (INDEPENDENT_AMBULATORY_CARE_PROVIDER_SITE_OTHER): Payer: Self-pay | Admitting: Otolaryngology

## 2020-05-11 VITALS — Temp 97.5°F

## 2020-05-11 DIAGNOSIS — J3081 Allergic rhinitis due to animal (cat) (dog) hair and dander: Secondary | ICD-10-CM | POA: Diagnosis not present

## 2020-05-11 DIAGNOSIS — J301 Allergic rhinitis due to pollen: Secondary | ICD-10-CM | POA: Diagnosis not present

## 2020-05-11 DIAGNOSIS — H8110 Benign paroxysmal vertigo, unspecified ear: Secondary | ICD-10-CM

## 2020-05-11 DIAGNOSIS — J3089 Other allergic rhinitis: Secondary | ICD-10-CM | POA: Diagnosis not present

## 2020-05-11 NOTE — Progress Notes (Signed)
HPI: Stanley Bennett is a 64 y.o. male who presents is referred by his PCP for evaluation of dizziness and vertigo that patient has had for several years.  He recently underwent an MRI scan on 8-21 that showed some subtle diffusion in the right mastoid region as well as some mucosal thickening or retention cyst within the floor of the maxillary sinuses worse on the left side.  Remaining sinuses were clear on review of the scan. Patient notices his dizziness or vertigo when he lays his head down or looks up things will spin for several seconds consistent with BPPV.  He has previously been treated with the Epley maneuver and has previously seen neurology for his dizziness. He has not noticed any hearing problems..  Past Medical History:  Diagnosis Date  . Allergy   . Basal cell carcinoma    scalp  . Gout   . Hyperlipidemia    Past Surgical History:  Procedure Laterality Date  . APPENDECTOMY    . BASAL CELL CARCINOMA EXCISION    . CERVICAL DISC SURGERY    . TONSILLECTOMY     Social History   Socioeconomic History  . Marital status: Married    Spouse name: [name]  . Number of children: 3  . Years of education: Not on file  . Highest education level: Not on file  Occupational History  . Occupation: Development worker, international aid  Tobacco Use  . Smoking status: Never Smoker  . Smokeless tobacco: Never Used  Vaping Use  . Vaping Use: Never used  Substance and Sexual Activity  . Alcohol use: Yes    Alcohol/week: 6.0 standard drinks    Types: 6 Cans of beer per week    Comment: 2 - 5 days per week  . Drug use: No  . Sexual activity: Yes    Partners: Female  Other Topics Concern  . Not on file  Social History Narrative   Work or School: [employer], 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      Home Situation: lives with wife      Spiritual Beliefs: Catholic      Lifestyle: regular exercise and healthy diet   Social Determinants of Health   Financial Resource Strain:   .  Difficulty of Paying Living Expenses: Not on file  Food Insecurity:   . Worried About Charity fundraiser in the Last Year: Not on file  . Ran Out of Food in the Last Year: Not on file  Transportation Needs:   . Lack of Transportation (Medical): Not on file  . Lack of Transportation (Non-Medical): Not on file  Physical Activity:   . Days of Exercise per Week: Not on file  . Minutes of Exercise per Session: Not on file  Stress:   . Feeling of Stress : Not on file  Social Connections:   . Frequency of Communication with Friends and Family: Not on file  . Frequency of Social Gatherings with Friends and Family: Not on file  . Attends Religious Services: Not on file  . Active Member of Clubs or Organizations: Not on file  . Attends Archivist Meetings: Not on file  . Marital Status: Not on file   Family History  Problem Relation Age of Onset  . Arthritis Mother        osteo  . Arthritis Father        osteo  . Heart disease Father        (pneumonia cause of death 23 yrs)  .  Cancer Sister        ovarian  . Diabetes Sister   . Kidney failure Cousin        pat cousin  . Other Brother        celiac sprue  . Kidney failure Cousin        pat cousin  . Colon cancer Neg Hx   . Esophageal cancer Neg Hx   . Rectal cancer Neg Hx    Allergies  Allergen Reactions  . Tamiflu [Oseltamivir] Rash    Mild rash 2018   Prior to Admission medications   Medication Sig Start Date End Date Taking? Authorizing Provider  allopurinol (ZYLOPRIM) 300 MG tablet TAKE 1 TABLET(300 MG) BY MOUTH DAILY AS DIRECTED 03/23/20  Yes Koberlein, Junell C, MD  aspirin EC 81 MG tablet Take 1 tablet (81 mg total) by mouth daily. 12/11/19  Yes Buford Dresser, MD  Cetirizine HCl (KLS ALLER-TEC PO) Take by mouth.   Yes [provider]  fluticasone (FLONASE) 50 MCG/ACT nasal spray Place 1 spray into both nostrils 2 (two) times daily.  07/28/16  Yes [provider]  pantoprazole  (PROTONIX) 20 MG tablet TAKE 1 TABLET(20 MG) BY MOUTH DAILY BEFORE BREAKFAST 11/18/19  Yes Koberlein, Steele Berg, MD  PRESCRIPTION MEDICATION Allergy shots every week   Yes [provider]  rosuvastatin (CRESTOR) 20 MG tablet Take 20 mg by mouth daily.   Yes [provider]  tadalafil (CIALIS) 5 MG tablet Take 1 tablet (5 mg total) by mouth daily. 04/24/20  Yes Koberlein, Steele Berg, MD  nitroGLYCERIN (NITROSTAT) 0.4 MG SL tablet Place 1 tablet (0.4 mg total) under the tongue every 5 (five) minutes as needed for chest pain. Do not use within 48 ours of using cialis. 12/11/19 03/10/20  Buford Dresser, MD     Positive ROS: Otherwise negative  All other systems have been reviewed and were otherwise negative with the exception of those mentioned in the HPI and as above.  Physical Exam: Constitutional: Alert, well-appearing, no acute distress Ears: External ears without lesions or tenderness. Ear canals are clear bilaterally.  TMs are clear bilaterally with good mobility on pneumatic otoscopy.  Hearing screening with the 512 1024 tuning fork reveals symmetric hearing with AC > BC bilaterally.  On Dix-Hallpike testing patient had short episode of nystagmus or vertigo consistent with positional vertigo. Nasal: External nose without lesions. Clear nasal passages Oral: Lips and gums without lesions. Tongue and palate mucosa without lesions. Posterior oropharynx clear. Neck: No palpable adenopathy or masses Respiratory: Breathing comfortably  Skin: No facial/neck lesions or rash noted.  Procedures  Assessment: Findings are consistent with BPPV. On review of the MRI scan patient has minimal mucosal thickening within the right mastoid area as well as along the floor the maxillary sinuses but I do not feel like this is contributing to his dizziness and requires no further therapy.  Plan: Reviewed with patient concerning benign positional vertigo as well as the Epley maneuver would  recommend using this which he has used in the past.  Also gave him information on BPPV as well as the Epley maneuver.  Discussed with him that if he continues have problems with vertigo that is not well controlled with the Epley maneuver he will call us to schedule further evaluation of his dizziness with VNG testing.   Radene Journey, MD   CC:

## 2020-05-18 DIAGNOSIS — J3089 Other allergic rhinitis: Secondary | ICD-10-CM | POA: Diagnosis not present

## 2020-05-18 DIAGNOSIS — J301 Allergic rhinitis due to pollen: Secondary | ICD-10-CM | POA: Diagnosis not present

## 2020-05-18 DIAGNOSIS — J3081 Allergic rhinitis due to animal (cat) (dog) hair and dander: Secondary | ICD-10-CM | POA: Diagnosis not present

## 2020-05-26 DIAGNOSIS — J3089 Other allergic rhinitis: Secondary | ICD-10-CM | POA: Diagnosis not present

## 2020-05-26 DIAGNOSIS — J301 Allergic rhinitis due to pollen: Secondary | ICD-10-CM | POA: Diagnosis not present

## 2020-05-26 DIAGNOSIS — J3081 Allergic rhinitis due to animal (cat) (dog) hair and dander: Secondary | ICD-10-CM | POA: Diagnosis not present

## 2020-06-02 DIAGNOSIS — J3089 Other allergic rhinitis: Secondary | ICD-10-CM | POA: Diagnosis not present

## 2020-06-02 DIAGNOSIS — J3081 Allergic rhinitis due to animal (cat) (dog) hair and dander: Secondary | ICD-10-CM | POA: Diagnosis not present

## 2020-06-02 DIAGNOSIS — J301 Allergic rhinitis due to pollen: Secondary | ICD-10-CM | POA: Diagnosis not present

## 2020-06-07 ENCOUNTER — Other Ambulatory Visit: Payer: Self-pay | Admitting: Family Medicine

## 2020-06-10 DIAGNOSIS — J3089 Other allergic rhinitis: Secondary | ICD-10-CM | POA: Diagnosis not present

## 2020-06-10 DIAGNOSIS — J301 Allergic rhinitis due to pollen: Secondary | ICD-10-CM | POA: Diagnosis not present

## 2020-06-10 DIAGNOSIS — J3081 Allergic rhinitis due to animal (cat) (dog) hair and dander: Secondary | ICD-10-CM | POA: Diagnosis not present

## 2020-06-17 ENCOUNTER — Ambulatory Visit (INDEPENDENT_AMBULATORY_CARE_PROVIDER_SITE_OTHER): Payer: PPO

## 2020-06-17 ENCOUNTER — Other Ambulatory Visit: Payer: Self-pay

## 2020-06-17 DIAGNOSIS — Z0000 Encounter for general adult medical examination without abnormal findings: Secondary | ICD-10-CM

## 2020-06-17 DIAGNOSIS — Z1211 Encounter for screening for malignant neoplasm of colon: Secondary | ICD-10-CM

## 2020-06-17 DIAGNOSIS — J3081 Allergic rhinitis due to animal (cat) (dog) hair and dander: Secondary | ICD-10-CM | POA: Diagnosis not present

## 2020-06-17 DIAGNOSIS — J3089 Other allergic rhinitis: Secondary | ICD-10-CM | POA: Diagnosis not present

## 2020-06-17 DIAGNOSIS — J301 Allergic rhinitis due to pollen: Secondary | ICD-10-CM | POA: Diagnosis not present

## 2020-06-17 NOTE — Progress Notes (Signed)
Virtual Visit via Telephone Note  I connected with  Stanley Bennett on 06/17/20 at  9:30 AM EDT by telephone and verified that I am speaking with the correct person using two identifiers.  Medicare Annual Wellness visit completed telephonically due to Covid-19 pandemic.   Persons participating in this call: This Health Coach and this patient.   Location: Patient: Home Provider: Office   I discussed the limitations, risks, security and privacy concerns of performing an evaluation and management service by telephone and the availability of in person appointments. The patient expressed understanding and agreed to proceed.  Unable to perform video visit due to video visit attempted and failed and/or patient does not have video capability.   Some vital signs may be absent or patient reported.   Stanley Brace, LPN    Subjective:   Stanley Bennett is a 64 y.o. male who presents for an Initial Medicare Annual Wellness Visit.  Review of Systems     Cardiac Risk Factors include: dyslipidemia     Objective:    There were no vitals filed for this visit. There is no height or weight on file to calculate BMI.  Advanced Directives 06/17/2020 02/13/2019 05/15/2017  Does Patient Have a Medical Advance Directive? Yes Yes Yes  Type of Paramedic of [place];Living will Living will [place];Living will  Does patient want to make changes to medical advance directive? - No - Patient declined -  Copy of [place] in Chart? No - copy requested - No - copy requested    Current Medications (verified) Outpatient Encounter Medications as of 06/17/2020  Medication Sig  . allopurinol (ZYLOPRIM) 300 MG tablet TAKE 1 TABLET(300 MG) BY MOUTH DAILY AS DIRECTED  . aspirin EC 81 MG tablet Take 1 tablet (81 mg total) by mouth daily.  . Cetirizine HCl (KLS ALLER-TEC PO) Take by mouth.  Marland Kitchen FLUAD QUADRIVALENT 0.5 ML injection   .  fluticasone (FLONASE) 50 MCG/ACT nasal spray Place 1 spray into both nostrils 2 (two) times daily.   . pantoprazole (PROTONIX) 20 MG tablet TAKE 1 TABLET(20 MG) BY MOUTH DAILY BEFORE BREAKFAST  . PRESCRIPTION MEDICATION Allergy shots every week  . rosuvastatin (CRESTOR) 20 MG tablet Take 20 mg by mouth daily.  Marland Kitchen SHINGRIX injection   . tadalafil (CIALIS) 5 MG tablet Take 1 tablet (5 mg total) by mouth daily.  Marland Kitchen EPINEPHrine 0.3 mg/0.3 mL IJ SOAJ injection SMARTSIG:Injection As Directed  . nitroGLYCERIN (NITROSTAT) 0.4 MG SL tablet Place 1 tablet (0.4 mg total) under the tongue every 5 (five) minutes as needed for chest pain. Do not use within 48 ours of using cialis.   No facility-administered encounter medications on file as of 06/17/2020.    Allergies (verified) Tamiflu [oseltamivir]   History: Past Medical History:  Diagnosis Date  . Allergy   . Basal cell carcinoma    scalp  . Gout   . Hyperlipidemia    Past Surgical History:  Procedure Laterality Date  . APPENDECTOMY    . BASAL CELL CARCINOMA EXCISION    . CERVICAL DISC SURGERY    . TONSILLECTOMY     Family History  Problem Relation Age of Onset  . Arthritis Mother        osteo  . Arthritis Father        osteo  . Heart disease Father        (pneumonia cause of death 66 yrs)  . Cancer Sister  ovarian  . Diabetes Sister   . Kidney failure Cousin        pat cousin  . Other Brother        celiac sprue  . Kidney failure Cousin        pat cousin  . Colon cancer Neg Hx   . Esophageal cancer Neg Hx   . Rectal cancer Neg Hx    Social History   Socioeconomic History  . Marital status: Married    Spouse name: Stanley Bennett  . Number of children: 3  . Years of education: Not on file  . Highest education level: Not on file  Occupational History  . Occupation: Development worker, international aid    Comment: retired  Tobacco Use  . Smoking status: Never Smoker  . Smokeless tobacco: Never Used  Vaping Use  . Vaping Use:  Never used  Substance and Sexual Activity  . Alcohol use: Yes    Alcohol/week: 6.0 standard drinks    Types: 6 Cans of beer per week    Comment: 2 - 5 days per week  . Drug use: No  . Sexual activity: Yes    Partners: Female  Other Topics Concern  . Not on file  Social History Narrative   Work or School: [employer], 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      Home Situation: lives with wife      Spiritual Beliefs: Catholic      Lifestyle: regular exercise and healthy diet   Social Determinants of Health   Financial Resource Strain: Low Risk   . Difficulty of Paying Living Expenses: Not hard at all  Food Insecurity: No Food Insecurity  . Worried About Charity fundraiser in the Last Year: Never true  . Ran Out of Food in the Last Year: Never true  Transportation Needs: No Transportation Needs  . Lack of Transportation (Medical): No  . Lack of Transportation (Non-Medical): No  Physical Activity: Sufficiently Active  . Days of Exercise per Week: 6 days  . Minutes of Exercise per Session: 120 min  Stress: No Stress Concern Present  . Feeling of Stress : Not at all  Social Connections: Moderately Integrated  . Frequency of Communication with Friends and Family: Three times a week  . Frequency of Social Gatherings with Friends and Family: More than three times a week  . Attends Religious Services: More than 4 times per year  . Active Member of Clubs or Organizations: No  . Attends Archivist Meetings: Never  . Marital Status: Married    Tobacco Counseling Counseling given: Not Answered   Clinical Intake:  Pre-visit preparation completed: Yes  Pain : No/denies pain     BMI - recorded: 24.04 Nutritional Status: BMI of 19-24  Normal Nutritional Risks: None Diabetes: No  How often do you need to have someone help you when you read instructions, pamphlets, or other written materials from your doctor or pharmacy?: 1 - Never  Diabetic?No  Interpreter Needed?:  No  Information entered by :: Charlott Rakes, LPN   Activities of Daily Living In your present state of health, do you have any difficulty performing the following activities: 06/17/2020  Vision? N  Difficulty concentrating or making decisions? N  Walking or climbing stairs? N  Dressing or bathing? N  Doing errands, shopping? N  Preparing Food and eating ? N  Using the Toilet? N  In the past six months, have you accidently leaked urine? N  Do you have problems with loss  of bowel control? N  Managing your Medications? N  Managing your Finances? N  Housekeeping or managing your Housekeeping? N  Some recent data might be hidden    Patient Care Team: Caren Macadam, MD as PCP - General (Family Medicine) Buford Dresser, MD as PCP - [facility]iology ([facility] [place] any recent Medical Services you may have received from other than [facility] providers in the past year (date may be approximate).     Assessment:   This is a routine wellness examination for [place].  Hearing/Vision screen  Hearing Screening   125Hz  250Hz  500Hz  1000Hz  2000Hz  3000Hz  4000Hz  6000Hz  8000Hz   Right ear:           Left ear:           Comments: Pt denies any hearing issue  Vision Screening Comments: [facility]s up with [facility] 2 years   Dietary issues and exercise activities discussed: Current Exercise Habits: Home exercise routine, Type of exercise: walking, Time (Minutes): > 60, Frequency (Times/Week): 6, Weekly Exercise (Minutes/Week): 0  Goals    . Patient Stated     Maintain weight      Depression Screen PHQ 2/9 Scores 06/17/2020 02/19/2020 12/11/2017  PHQ - 2 Score 0 0 0    Fall Risk Fall Risk  06/17/2020 02/19/2020  Falls in the past year? 0 0  Number falls in past yr: 0 0  Injury with Fall? 0 0  Risk for fall due to : Impaired vision;Impaired balance/gait -  Risk for fall due to: Comment vertigo at times -  Follow up Falls prevention discussed -     Any stairs in or around the home? Yes  If so, are there any without handrails? No  Home free of loose throw rugs in walkways, pet beds, electrical cords, etc? Yes  Adequate lighting in your home to reduce risk of falls? Yes   ASSISTIVE DEVICES UTILIZED TO PREVENT FALLS:  Life alert? No  Use of a cane, walker or w/c? No  Grab bars in the bathroom? No  Shower chair or bench in shower? No  Elevated toilet seat or a handicapped toilet? Yes   TIMED UP AND GO:  Was the test performed? No    Cognitive Function:     6CIT Screen 06/17/2020  What Year? 0 points  What month? 0 points  Count back from 20 0 points  Months in reverse 0 points  Repeat phrase 0 points    Immunizations Immunization History  Administered Date(s) Administered  . Influenza-Unspecified 05/27/2010, 05/14/2013, 05/26/2015, 05/15/2017, 06/03/2017, 05/17/2018, 05/19/2019, 05/05/2020  . PFIZER SARS-COV-2 Vaccination 09/04/2019, 09/22/2019  . Pneumococcal Polysaccharide-23 02/19/2020  . Tdap 06/29/2006, 11/22/2016  . Typhoid Live 11/22/2016  . Yellow Fever 11/22/2016  . Zoster 06/23/2015  . Zoster Recombinat (Shingrix) 03/24/2020    TDAP status: Up to date Flu Vaccine status: Up to date Pneumococcal vaccine status: Up to date Covid-19 vaccine status: Completed vaccines  Qualifies for Shingles Vaccine? Yes   Zostavax completed Yes   Shingrix Completed?: No.    Education has been provided regarding the importance of this vaccine. Patient has been advised to call insurance company to determine out of pocket expense if they have not yet received this vaccine. Advised may also receive vaccine at local pharmacy or Health Dept. Verbalized acceptance and understanding.  Screening Tests Health Maintenance  Topic Date Due  . COLONOSCOPY  05/15/2020  . PNA vac Low Risk Adult (2 of 2 - PCV13) 02/18/2021  .  TETANUS/TDAP  11/23/2026  . INFLUENZA VACCINE  Completed  . COVID-19 Vaccine  Completed  . Hepatitis C  Screening  Completed    Health Maintenance  Health Maintenance Due  Topic Date Due  . COLONOSCOPY  05/15/2020    Colorectal cancer screening: Completed 05/15/17. Repeat every 3 years    Additional Screening:  Hepatitis C Screening:  Completed 12/06/16  Vision Screening: Recommended annual ophthalmology exams for early detection of glaucoma and other disorders of the eye. Is the patient up to date with their annual eye exam?  Yes  Who is the provider or what is the name of the office in which the patient attends annual eye exams? [facility]  Dental Screening: Recommended annual dental exams for proper oral hygiene  Community Resource Referral / Chronic Care Management: CRR required this visit?  No   CCM required this visit?  No      Plan:     I have personally reviewed and noted the following in the patient's chart:   . Medical and social history . Use of alcohol, tobacco or illicit drugs  . Current medications and supplements . Functional ability and status . Nutritional status . Physical activity . Advanced directives . List of other physicians . Hospitalizations, surgeries, and ER visits in previous 12 months . Vitals . Screenings to include cognitive, depression, and falls . Referrals and appointments  In addition, I have reviewed and discussed with patient certain preventive protocols, quality metrics, and best practice recommendations. A written personalized care plan for preventive services as well as general preventive health recommendations were provided to patient.     Stanley Brace, LPN   [id number]   Nurse Notes: None

## 2020-06-17 NOTE — Patient Instructions (Addendum)
[name] , Thank you for taking time to come for your Medicare Wellness Visit. I appreciate your ongoing commitment to your health goals. Please review the following plan we discussed and let me know if I can assist you in the future.   Screening recommendations/referrals: Colonoscopy: Done 05/15/2017, ordered 06/17/20 Recommended yearly ophthalmology/optometry visit for glaucoma screening and checkup Recommended yearly dental visit for hygiene and checkup  Vaccinations: Influenza vaccine: Up to date 05/05/20 Pneumococcal vaccine: Done 02/19/20 (PPSV) Tdap vaccine: Up to date Shingles vaccine: Shingrix discussed. Please contact your pharmacy for coverage information.  Covid-19: Completed 1/20 & 09/22/19  Advanced directives: total hip arthroplasty  Conditions/risks identified: Maintain weight   Next appointment: Follow up in one year for your annual wellness visit.   Preventive Care 66 Years and Older, Male Preventive care refers to lifestyle choices and visits with your health care provider that can promote health and wellness. What does preventive care include?  A yearly physical exam. This is also called an annual well check.  Dental exams once or twice a year.  Routine eye exams. Ask your health care provider how often you should have your eyes checked.  Personal lifestyle choices, including:  Daily care of your teeth and gums.  Regular physical activity.  Eating a healthy diet.  Avoiding tobacco and drug use.  Limiting alcohol use.  Practicing safe sex.  Taking low doses of aspirin every day.  Taking vitamin and mineral supplements as recommended by your health care provider. What happens during an annual well check? The services and screenings done by your health care provider during your annual well check will depend on your age, overall health, lifestyle risk factors, and family history of disease. Counseling  Your health care provider may ask you questions  about your:  Alcohol use.  Tobacco use.  Drug use.  Emotional well-being.  Home and relationship well-being.  Sexual activity.  Eating habits.  History of falls.  Memory and ability to understand (cognition).  Work and work Statistician. Screening  You may have the following tests or measurements:  Height, weight, and BMI.  Blood pressure.  Lipid and cholesterol levels. These may be checked every 5 years, or more frequently if you are over 61 years old.  Skin check.  Lung cancer screening. You may have this screening every year starting at age 66 if you have a 30-pack-year history of smoking and currently smoke or have quit within the past 15 years.  Fecal occult blood test (FOBT) of the stool. You may have this test every year starting at age 66.  Flexible sigmoidoscopy or colonoscopy. You may have a sigmoidoscopy every 5 years or a colonoscopy every 10 years starting at age 72.  Prostate cancer screening. Recommendations will vary depending on your family history and other risks.  Hepatitis C blood test.  Hepatitis B blood test.  Sexually transmitted disease (STD) testing.  Diabetes screening. This is done by checking your blood sugar (glucose) after you have not eaten for a while (fasting). You may have this done every 1-3 years.  Abdominal aortic aneurysm (AAA) screening. You may need this if you are a current or former smoker.  Osteoporosis. You may be screened starting at age 45 if you are at high risk. Talk with your health care provider about your test results, treatment options, and if necessary, the need for more tests. Vaccines  Your health care provider may recommend certain vaccines, such as:  Influenza vaccine. This is recommended every year.  Tetanus, diphtheria, and acellular pertussis (Tdap, Td) vaccine. You may need a Td booster every 10 years.  Zoster vaccine. You may need this after age 66.  Pneumococcal 13-valent conjugate (PCV13)  vaccine. One dose is recommended after age 46.  Pneumococcal polysaccharide (PPSV23) vaccine. One dose is recommended after age 18. Talk to your health care provider about which screenings and vaccines you need and how often you need them. This information is not intended to replace advice given to you by your health care provider. Make sure you discuss any questions you have with your health care provider. Document Released: 08/28/2015 Document Revised: 04/20/2016 Document Reviewed: 06/02/2015 Elsevier Interactive Patient Education  2017 [employer] Prevention in the Home Falls can cause injuries. They can happen to people of all ages. There are many things you can do to make your home safe and to help prevent falls. What can I do on the outside of my home?  Regularly fix the edges of walkways and driveways and fix any cracks.  Remove anything that might make you trip as you walk through a door, such as a raised step or threshold.  Trim any bushes or trees on the path to your home.  Use bright outdoor lighting.  Clear any walking paths of anything that might make someone trip, such as rocks or tools.  Regularly check to see if handrails are loose or broken. Make sure that both sides of any steps have handrails.  Any raised decks and porches should have guardrails on the edges.  Have any leaves, snow, or ice cleared regularly.  Use sand or salt on walking paths during winter.  Clean up any spills in your garage right away. This includes oil or grease spills. What can I do in the bathroom?  Use night lights.  Install grab bars by the toilet and in the tub and shower. Do not use towel bars as grab bars.  Use non-skid mats or decals in the tub or shower.  If you need to sit down in the shower, use a plastic, non-slip stool.  Keep the floor dry. Clean up any water that spills on the floor as soon as it happens.  Remove soap buildup in the tub or shower  regularly.  Attach bath mats securely with double-sided non-slip rug tape.  Do not have throw rugs and other things on the floor that can make you trip. What can I do in the bedroom?  Use night lights.  Make sure that you have a light by your bed that is easy to reach.  Do not use any sheets or blankets that are too big for your bed. They should not hang down onto the floor.  Have a firm chair that has side arms. You can use this for support while you get dressed.  Do not have throw rugs and other things on the floor that can make you trip. What can I do in the kitchen?  Clean up any spills right away.  Avoid walking on wet floors.  Keep items that you use a lot in easy-to-reach places.  If you need to reach something above you, use a strong step stool that has a grab bar.  Keep electrical cords out of the way.  Do not use floor polish or wax that makes floors slippery. If you must use wax, use non-skid floor wax.  Do not have throw rugs and other things on the floor that can make you trip. What can I do  with my stairs?  Do not leave any items on the stairs.  Make sure that there are handrails on both sides of the stairs and use them. Fix handrails that are broken or loose. Make sure that handrails are as long as the stairways.  Check any carpeting to make sure that it is firmly attached to the stairs. Fix any carpet that is loose or worn.  Avoid having throw rugs at the top or bottom of the stairs. If you do have throw rugs, attach them to the floor with carpet tape.  Make sure that you have a light switch at the top of the stairs and the bottom of the stairs. If you do not have them, ask someone to add them for you. What else can I do to help prevent falls?  Wear shoes that:  Do not have high heels.  Have rubber bottoms.  Are comfortable and fit you well.  Are closed at the toe. Do not wear sandals.  If you use a stepladder:  Make sure that it is fully  opened. Do not climb a closed stepladder.  Make sure that both sides of the stepladder are locked into place.  Ask someone to hold it for you, if possible.  Clearly mark and make sure that you can see:  Any grab bars or handrails.  First and last steps.  Where the edge of each step is.  Use tools that help you move around (mobility aids) if they are needed. These include:  Canes.  Walkers.  Scooters.  Crutches.  Turn on the lights when you go into a dark area. Replace any light bulbs as soon as they burn out.  Set up your furniture so you have a clear path. Avoid moving your furniture around.  If any of your floors are uneven, fix them.  If there are any pets around you, be aware of where they are.  Review your medicines with your doctor. Some medicines can make you feel dizzy. This can increase your chance of falling. Ask your doctor what other things that you can do to help prevent falls. This information is not intended to replace advice given to you by your health care provider. Make sure you discuss any questions you have with your health care provider. Document Released: 05/28/2009 Document Revised: 01/07/2016 Document Reviewed: 09/05/2014 Elsevier Interactive Patient Education  2017 [employer].

## 2020-06-19 ENCOUNTER — Other Ambulatory Visit: Payer: Self-pay | Admitting: Family Medicine

## 2020-07-01 DIAGNOSIS — J3089 Other allergic rhinitis: Secondary | ICD-10-CM | POA: Diagnosis not present

## 2020-07-01 DIAGNOSIS — J301 Allergic rhinitis due to pollen: Secondary | ICD-10-CM | POA: Diagnosis not present

## 2020-07-01 DIAGNOSIS — J3081 Allergic rhinitis due to animal (cat) (dog) hair and dander: Secondary | ICD-10-CM | POA: Diagnosis not present

## 2020-07-08 DIAGNOSIS — J3089 Other allergic rhinitis: Secondary | ICD-10-CM | POA: Diagnosis not present

## 2020-07-08 DIAGNOSIS — J301 Allergic rhinitis due to pollen: Secondary | ICD-10-CM | POA: Diagnosis not present

## 2020-07-08 DIAGNOSIS — J3081 Allergic rhinitis due to animal (cat) (dog) hair and dander: Secondary | ICD-10-CM | POA: Diagnosis not present

## 2020-07-13 DIAGNOSIS — J3089 Other allergic rhinitis: Secondary | ICD-10-CM | POA: Diagnosis not present

## 2020-07-13 DIAGNOSIS — J3081 Allergic rhinitis due to animal (cat) (dog) hair and dander: Secondary | ICD-10-CM | POA: Diagnosis not present

## 2020-07-13 DIAGNOSIS — J301 Allergic rhinitis due to pollen: Secondary | ICD-10-CM | POA: Diagnosis not present

## 2020-07-15 ENCOUNTER — Encounter: Payer: Self-pay | Admitting: Internal Medicine

## 2020-07-15 DIAGNOSIS — J301 Allergic rhinitis due to pollen: Secondary | ICD-10-CM | POA: Diagnosis not present

## 2020-07-15 DIAGNOSIS — J3081 Allergic rhinitis due to animal (cat) (dog) hair and dander: Secondary | ICD-10-CM | POA: Diagnosis not present

## 2020-07-15 DIAGNOSIS — J3089 Other allergic rhinitis: Secondary | ICD-10-CM | POA: Diagnosis not present

## 2020-07-20 ENCOUNTER — Other Ambulatory Visit: Payer: Self-pay | Admitting: Family Medicine

## 2020-07-20 DIAGNOSIS — J301 Allergic rhinitis due to pollen: Secondary | ICD-10-CM | POA: Diagnosis not present

## 2020-07-20 DIAGNOSIS — N401 Enlarged prostate with lower urinary tract symptoms: Secondary | ICD-10-CM

## 2020-07-20 DIAGNOSIS — J3081 Allergic rhinitis due to animal (cat) (dog) hair and dander: Secondary | ICD-10-CM | POA: Diagnosis not present

## 2020-07-20 DIAGNOSIS — J3089 Other allergic rhinitis: Secondary | ICD-10-CM | POA: Diagnosis not present

## 2020-07-20 MED ORDER — TADALAFIL 5 MG PO TABS: 5.0000 mg | ORAL_TABLET | Freq: Every day | ORAL | 0 refills | Status: DC

## 2020-07-28 DIAGNOSIS — J3089 Other allergic rhinitis: Secondary | ICD-10-CM | POA: Diagnosis not present

## 2020-07-28 DIAGNOSIS — J3081 Allergic rhinitis due to animal (cat) (dog) hair and dander: Secondary | ICD-10-CM | POA: Diagnosis not present

## 2020-07-28 DIAGNOSIS — J301 Allergic rhinitis due to pollen: Secondary | ICD-10-CM | POA: Diagnosis not present

## 2020-07-30 ENCOUNTER — Other Ambulatory Visit: Payer: Self-pay

## 2020-07-30 ENCOUNTER — Ambulatory Visit (AMBULATORY_SURGERY_CENTER): Payer: Self-pay

## 2020-07-30 VITALS — Ht 69.0 in | Wt 165.0 lb

## 2020-07-30 DIAGNOSIS — Z86010 Personal history of colonic polyps: Secondary | ICD-10-CM

## 2020-07-30 MED ORDER — SUTAB 1479-225-188 MG PO TABS: 1.0000 | ORAL_TABLET | ORAL | 0 refills | Status: DC

## 2020-07-30 NOTE — Progress Notes (Signed)
No egg or soy allergy known to patient  No issues with past sedation with any surgeries or procedures No intubation problems in the past  No FH of Malignant Hyperthermia No diet pills per patient No home 02 use per patient  No blood thinners per patient  Pt denies issues with constipation  No A fib or A flutter  EMMI video via [place] 19 guidelines implemented in PV today with Pt and RN  Pt is fully vaccinated  for Covid + booster Coupon given to pt in PV today , Code to Pharmacy  Due to the COVID-19 pandemic we are asking patients to follow certain guidelines.  Pt aware of COVID protocols and [facility] guidelines

## 2020-08-04 ENCOUNTER — Encounter: Payer: Self-pay | Admitting: Internal Medicine

## 2020-08-05 DIAGNOSIS — J3089 Other allergic rhinitis: Secondary | ICD-10-CM | POA: Diagnosis not present

## 2020-08-05 DIAGNOSIS — J3081 Allergic rhinitis due to animal (cat) (dog) hair and dander: Secondary | ICD-10-CM | POA: Diagnosis not present

## 2020-08-05 DIAGNOSIS — J301 Allergic rhinitis due to pollen: Secondary | ICD-10-CM | POA: Diagnosis not present

## 2020-08-10 DIAGNOSIS — J301 Allergic rhinitis due to pollen: Secondary | ICD-10-CM | POA: Diagnosis not present

## 2020-08-10 DIAGNOSIS — J3089 Other allergic rhinitis: Secondary | ICD-10-CM | POA: Diagnosis not present

## 2020-08-10 DIAGNOSIS — J3081 Allergic rhinitis due to animal (cat) (dog) hair and dander: Secondary | ICD-10-CM | POA: Diagnosis not present

## 2020-08-18 DIAGNOSIS — J3089 Other allergic rhinitis: Secondary | ICD-10-CM | POA: Diagnosis not present

## 2020-08-18 DIAGNOSIS — J301 Allergic rhinitis due to pollen: Secondary | ICD-10-CM | POA: Diagnosis not present

## 2020-08-18 DIAGNOSIS — J3081 Allergic rhinitis due to animal (cat) (dog) hair and dander: Secondary | ICD-10-CM | POA: Diagnosis not present

## 2020-08-19 ENCOUNTER — Encounter: Payer: Self-pay | Admitting: Family Medicine

## 2020-08-19 DIAGNOSIS — Z1283 Encounter for screening for malignant neoplasm of skin: Secondary | ICD-10-CM

## 2020-08-21 DIAGNOSIS — J3081 Allergic rhinitis due to animal (cat) (dog) hair and dander: Secondary | ICD-10-CM | POA: Diagnosis not present

## 2020-08-21 DIAGNOSIS — J3089 Other allergic rhinitis: Secondary | ICD-10-CM | POA: Diagnosis not present

## 2020-08-21 DIAGNOSIS — J301 Allergic rhinitis due to pollen: Secondary | ICD-10-CM | POA: Diagnosis not present

## 2020-08-25 ENCOUNTER — Encounter: Payer: Self-pay | Admitting: Internal Medicine

## 2020-08-25 ENCOUNTER — Ambulatory Visit (AMBULATORY_SURGERY_CENTER): Payer: PPO | Admitting: Internal Medicine

## 2020-08-25 ENCOUNTER — Other Ambulatory Visit: Payer: Self-pay

## 2020-08-25 VITALS — BP 143/77 | HR 52 | Temp 96.9°F | Resp 12 | Ht 69.0 in | Wt 165.0 lb

## 2020-08-25 DIAGNOSIS — D120 Benign neoplasm of cecum: Secondary | ICD-10-CM | POA: Diagnosis not present

## 2020-08-25 DIAGNOSIS — Z86010 Personal history of colonic polyps: Secondary | ICD-10-CM

## 2020-08-25 DIAGNOSIS — Z1211 Encounter for screening for malignant neoplasm of colon: Secondary | ICD-10-CM | POA: Diagnosis not present

## 2020-08-25 MED ORDER — SODIUM CHLORIDE 0.9 % IV SOLN: 500.0000 mL | Freq: Once | INTRAVENOUS | Status: DC

## 2020-08-25 NOTE — Progress Notes (Signed)
VS-SP  Pt's states no medical or surgical changes since previsit or office visit.  

## 2020-08-25 NOTE — Patient Instructions (Signed)
HANDOUTS PROVIDED ON: POLYPS & HEMORRHOIDS  The polyp removed today have been sent for pathology.  The results can take 1-3 weeks to receive.  When your next colonoscopy should occur will be based on the pathology results.    You may resume your previous diet and medication schedule.  Thank you for allowing [place] to care for you today!!!   YOU HAD AN ENDOSCOPIC PROCEDURE TODAY AT [facility]:   Refer to the procedure report that was given to you for any specific questions about what was found during the examination.  If the procedure report does not answer your questions, please call your gastroenterologist to clarify.  If you requested that your care partner not be given the details of your procedure findings, then the procedure report has been included in a sealed envelope for you to review at your convenience later.  YOU SHOULD EXPECT: Some feelings of bloating in the abdomen. Passage of more gas than usual.  Walking can help get rid of the air that was put into your GI tract during the procedure and reduce the bloating. If you had a lower endoscopy (such as a colonoscopy or flexible sigmoidoscopy) you may notice spotting of blood in your stool or on the toilet paper. If you underwent a bowel prep for your procedure, you may not have a normal bowel movement for a few days.  Please Note:  You might notice some irritation and congestion in your nose or some drainage.  This is from the oxygen used during your procedure.  There is no need for concern and it should clear up in a day or so.  SYMPTOMS TO REPORT IMMEDIATELY:   Following lower endoscopy (colonoscopy or flexible sigmoidoscopy):  Excessive amounts of blood in the stool  Significant tenderness or worsening of abdominal pains  Swelling of the abdomen that is new, acute  Fever of 100F or higher  For urgent or emergent issues, a gastroenterologist can be reached at any hour by calling [phone]. Do not use MyChart  messaging for urgent concerns.    DIET:  We do recommend a small meal at first, but then you may proceed to your regular diet.  Drink plenty of fluids but you should avoid alcoholic beverages for 24 hours.  ACTIVITY:  You should plan to take it easy for the rest of today and you should NOT DRIVE or use heavy machinery until tomorrow (because of the sedation medicines used during the test).    FOLLOW UP: Our staff will call the number listed on your records Thursday morning between 7:15 am and 8:15 am to check on you and address any questions or concerns that you may have regarding the information given to you following your procedure. If we do not reach you, we will leave a message.  We will attempt to reach you two times.  During this call, we will ask if you have developed any symptoms of COVID 19. If you develop any symptoms (ie: fever, flu-like symptoms, shortness of breath, cough etc.) before then, please call [phone].  If you test positive for Covid 19 in the 2 weeks post procedure, please call and report this information to [place].    If any biopsies were taken you will be contacted by phone or by letter within the next 1-3 weeks.  Please call us at [phone] if you have not heard about the biopsies in 3 weeks.    SIGNATURES/CONFIDENTIALITY: You and/or your care partner have signed paperwork  which will be entered into your electronic medical record.  These signatures attest to the fact that that the information above on your After Visit Summary has been reviewed and is understood.  Full responsibility of the confidentiality of this discharge information lies with you and/or your care-partner.

## 2020-08-25 NOTE — Progress Notes (Signed)
A and O x3. Report to RN. Tolerated MAC anesthesia well.

## 2020-08-25 NOTE — Op Note (Signed)
[facility] Patient Name: Stanley Bennett Procedure Date: 08/25/2020 2:38 PM MRN: [id number] Endoscopist: Docia Chuck. Henrene Pastor , MD Age: 65 Referring MD:  Date of Birth: 1955/11/20 Gender: Male Account #: [id number] Procedure:                Colonoscopy cold snare polypectomy x 1 Indications:              High risk colon cancer surveillance: Personal                            history of multiple (3 or more) adenomas. Previous                            examinations 2006, 2013. Medicines:                Monitored Anesthesia Care Procedure:                Pre-Anesthesia Assessment:                           - Prior to the procedure, a History and Physical                            was performed, and patient medications and                            allergies were reviewed. The patient's tolerance of                            previous anesthesia was also reviewed. The risks                            and benefits of the procedure and the sedation                            options and risks were discussed with the patient.                            All questions were answered, and informed consent                            was obtained. Prior Anticoagulants: The patient has                            taken no previous anticoagulant or antiplatelet                            agents. ASA Grade Assessment: II - A patient with                            mild systemic disease. After reviewing the risks                            and benefits, the patient was deemed in  satisfactory condition to undergo the procedure.                           After obtaining informed consent, the colonoscope                            was passed under direct vision. Throughout the                            procedure, the patient's blood pressure, pulse, and                            oxygen saturations were monitored continuously. The                            Olympus  [id number]-HQ190L [id number]) Colonoscope was                            introduced through the anus and advanced to the the                            cecum, identified by appendiceal orifice and                            ileocecal valve. The ileocecal valve, appendiceal                            orifice, and rectum were photographed. The quality                            of the bowel preparation was excellent. The                            colonoscopy was performed without difficulty. The                            patient tolerated the procedure well. The bowel                            preparation used was SUPREP via split dose                            instruction.                           NOTE: The initial colonoscope was passed through                            the entire colon to the cecum without difficulty.                            However, there was an oily substance on the lens  which decreased optimal visualization. Thus, the                            first colonoscope was exchanged for a new                            colonoscope and the examination completed without                            issues. Scope In: 3:13:07 PM Scope Out: 3:20:30 PM Scope Withdrawal Time: 0 hours 5 minutes 26 seconds  Total Procedure Duration: 0 hours 7 minutes 23 seconds  Findings:                 A 6 mm polyp was found in the cecum. The polyp was                            sessile. The polyp was removed with a cold snare.                            Resection and retrieval were complete.                           Internal hemorrhoids were found during retroflexion.                           The exam was otherwise without abnormality on                            direct and retroflexion views. Complications:            No immediate complications. Estimated blood loss:                            None. Estimated Blood Loss:     Estimated blood loss: none. Impression:                - One 6 mm polyp in the cecum, removed with a cold                            snare. Resected and retrieved.                           - Internal hemorrhoids.                           - The examination was otherwise normal on direct                            and retroflexion views. Recommendation:           - Repeat colonoscopy in 5 years for surveillance                            (history of adenomatous polyps).                           -  Patient has a contact number available for                            emergencies. The signs and symptoms of potential                            delayed complications were discussed with the                            patient. Return to normal activities tomorrow.                            Written discharge instructions were provided to the                            patient.                           - Resume previous diet.                           - Continue present medications.                           - Await pathology results. Docia Chuck. Henrene Pastor, MD 08/25/2020 3:27:42 PM This report has been signed electronically.

## 2020-08-25 NOTE — Progress Notes (Signed)
Called to room to assist during endoscopic procedure.  Patient ID and intended procedure confirmed with present staff. Received instructions for my participation in the procedure from the performing physician.  

## 2020-08-26 DIAGNOSIS — J3089 Other allergic rhinitis: Secondary | ICD-10-CM | POA: Diagnosis not present

## 2020-08-26 DIAGNOSIS — J3081 Allergic rhinitis due to animal (cat) (dog) hair and dander: Secondary | ICD-10-CM | POA: Diagnosis not present

## 2020-08-26 DIAGNOSIS — J301 Allergic rhinitis due to pollen: Secondary | ICD-10-CM | POA: Diagnosis not present

## 2020-08-27 ENCOUNTER — Telehealth: Payer: Self-pay

## 2020-08-27 NOTE — Telephone Encounter (Signed)
  Follow up Call-  Call back number 08/25/2020  Post procedure Call Back phone  # [phone]  Permission to leave phone message Yes  Some recent data might be hidden     Patient questions:  Do you have a fever, pain , or abdominal swelling? No. Pain Score  0 *  Have you tolerated food without any problems? Yes.    Have you been able to return to your normal activities? Yes.    Do you have any questions about your discharge instructions: Diet   No. Medications  No. Follow up visit  No.  Do you have questions or concerns about your Care? No.  Actions: * If pain score is 4 or above: No action needed, pain <4.  1. Have you developed a fever since your procedure? no  2.   Have you had an respiratory symptoms (SOB or cough) since your procedure? no  3.   Have you tested positive for COVID 19 since your procedure no  4.   Have you had any family members/close contacts diagnosed with the COVID 19 since your procedure?  no   If yes to any of these questions please route to Joylene John, RN and Joella Prince, RN

## 2020-08-27 NOTE — Telephone Encounter (Signed)
Left message on follow up call. 

## 2020-09-01 ENCOUNTER — Encounter: Payer: Self-pay | Admitting: Internal Medicine

## 2020-09-02 ENCOUNTER — Encounter: Payer: Self-pay | Admitting: Family Medicine

## 2020-09-02 DIAGNOSIS — D2262 Melanocytic nevi of left upper limb, including shoulder: Secondary | ICD-10-CM | POA: Diagnosis not present

## 2020-09-02 DIAGNOSIS — D1801 Hemangioma of skin and subcutaneous tissue: Secondary | ICD-10-CM | POA: Diagnosis not present

## 2020-09-02 DIAGNOSIS — D692 Other nonthrombocytopenic purpura: Secondary | ICD-10-CM | POA: Diagnosis not present

## 2020-09-02 DIAGNOSIS — D2271 Melanocytic nevi of right lower limb, including hip: Secondary | ICD-10-CM | POA: Diagnosis not present

## 2020-09-02 DIAGNOSIS — L821 Other seborrheic keratosis: Secondary | ICD-10-CM | POA: Diagnosis not present

## 2020-09-02 DIAGNOSIS — D225 Melanocytic nevi of trunk: Secondary | ICD-10-CM | POA: Diagnosis not present

## 2020-09-02 DIAGNOSIS — L570 Actinic keratosis: Secondary | ICD-10-CM | POA: Diagnosis not present

## 2020-09-02 DIAGNOSIS — C4441 Basal cell carcinoma of skin of scalp and neck: Secondary | ICD-10-CM | POA: Diagnosis not present

## 2020-09-02 DIAGNOSIS — L814 Other melanin hyperpigmentation: Secondary | ICD-10-CM | POA: Diagnosis not present

## 2020-09-03 DIAGNOSIS — J301 Allergic rhinitis due to pollen: Secondary | ICD-10-CM | POA: Diagnosis not present

## 2020-09-03 DIAGNOSIS — J3089 Other allergic rhinitis: Secondary | ICD-10-CM | POA: Diagnosis not present

## 2020-09-03 DIAGNOSIS — J3081 Allergic rhinitis due to animal (cat) (dog) hair and dander: Secondary | ICD-10-CM | POA: Diagnosis not present

## 2020-09-07 DIAGNOSIS — J3089 Other allergic rhinitis: Secondary | ICD-10-CM | POA: Diagnosis not present

## 2020-09-07 DIAGNOSIS — J3081 Allergic rhinitis due to animal (cat) (dog) hair and dander: Secondary | ICD-10-CM | POA: Diagnosis not present

## 2020-09-07 DIAGNOSIS — J301 Allergic rhinitis due to pollen: Secondary | ICD-10-CM | POA: Diagnosis not present

## 2020-09-13 ENCOUNTER — Other Ambulatory Visit: Payer: Self-pay | Admitting: Family Medicine

## 2020-09-15 DIAGNOSIS — J3089 Other allergic rhinitis: Secondary | ICD-10-CM | POA: Diagnosis not present

## 2020-09-15 DIAGNOSIS — J301 Allergic rhinitis due to pollen: Secondary | ICD-10-CM | POA: Diagnosis not present

## 2020-09-15 DIAGNOSIS — J3081 Allergic rhinitis due to animal (cat) (dog) hair and dander: Secondary | ICD-10-CM | POA: Diagnosis not present

## 2020-09-22 DIAGNOSIS — J3081 Allergic rhinitis due to animal (cat) (dog) hair and dander: Secondary | ICD-10-CM | POA: Diagnosis not present

## 2020-09-22 DIAGNOSIS — J3089 Other allergic rhinitis: Secondary | ICD-10-CM | POA: Diagnosis not present

## 2020-09-22 DIAGNOSIS — J301 Allergic rhinitis due to pollen: Secondary | ICD-10-CM | POA: Diagnosis not present

## 2020-09-23 DIAGNOSIS — C4441 Basal cell carcinoma of skin of scalp and neck: Secondary | ICD-10-CM | POA: Diagnosis not present

## 2020-09-23 DIAGNOSIS — Z85828 Personal history of other malignant neoplasm of skin: Secondary | ICD-10-CM | POA: Diagnosis not present

## 2020-09-28 DIAGNOSIS — J301 Allergic rhinitis due to pollen: Secondary | ICD-10-CM | POA: Diagnosis not present

## 2020-09-28 DIAGNOSIS — J3081 Allergic rhinitis due to animal (cat) (dog) hair and dander: Secondary | ICD-10-CM | POA: Diagnosis not present

## 2020-09-28 DIAGNOSIS — J3089 Other allergic rhinitis: Secondary | ICD-10-CM | POA: Diagnosis not present

## 2020-10-07 DIAGNOSIS — J301 Allergic rhinitis due to pollen: Secondary | ICD-10-CM | POA: Diagnosis not present

## 2020-10-07 DIAGNOSIS — J3089 Other allergic rhinitis: Secondary | ICD-10-CM | POA: Diagnosis not present

## 2020-10-07 DIAGNOSIS — J3081 Allergic rhinitis due to animal (cat) (dog) hair and dander: Secondary | ICD-10-CM | POA: Diagnosis not present

## 2020-10-12 DIAGNOSIS — Z1159 Encounter for screening for other viral diseases: Secondary | ICD-10-CM | POA: Diagnosis not present

## 2020-10-14 DIAGNOSIS — J3081 Allergic rhinitis due to animal (cat) (dog) hair and dander: Secondary | ICD-10-CM | POA: Diagnosis not present

## 2020-10-14 DIAGNOSIS — J301 Allergic rhinitis due to pollen: Secondary | ICD-10-CM | POA: Diagnosis not present

## 2020-10-14 DIAGNOSIS — J3089 Other allergic rhinitis: Secondary | ICD-10-CM | POA: Diagnosis not present

## 2020-10-20 DIAGNOSIS — J3089 Other allergic rhinitis: Secondary | ICD-10-CM | POA: Diagnosis not present

## 2020-10-20 DIAGNOSIS — J301 Allergic rhinitis due to pollen: Secondary | ICD-10-CM | POA: Diagnosis not present

## 2020-10-20 DIAGNOSIS — J3081 Allergic rhinitis due to animal (cat) (dog) hair and dander: Secondary | ICD-10-CM | POA: Diagnosis not present

## 2020-10-22 ENCOUNTER — Other Ambulatory Visit: Payer: Self-pay | Admitting: Family Medicine

## 2020-10-22 DIAGNOSIS — N401 Enlarged prostate with lower urinary tract symptoms: Secondary | ICD-10-CM

## 2020-10-23 MED ORDER — TADALAFIL 5 MG PO TABS: 5.0000 mg | ORAL_TABLET | Freq: Every day | ORAL | 0 refills | Status: DC

## 2020-10-28 DIAGNOSIS — J3089 Other allergic rhinitis: Secondary | ICD-10-CM | POA: Diagnosis not present

## 2020-10-28 DIAGNOSIS — J301 Allergic rhinitis due to pollen: Secondary | ICD-10-CM | POA: Diagnosis not present

## 2020-10-28 DIAGNOSIS — J3081 Allergic rhinitis due to animal (cat) (dog) hair and dander: Secondary | ICD-10-CM | POA: Diagnosis not present

## 2020-11-05 DIAGNOSIS — J3081 Allergic rhinitis due to animal (cat) (dog) hair and dander: Secondary | ICD-10-CM | POA: Diagnosis not present

## 2020-11-05 DIAGNOSIS — J3089 Other allergic rhinitis: Secondary | ICD-10-CM | POA: Diagnosis not present

## 2020-11-05 DIAGNOSIS — J301 Allergic rhinitis due to pollen: Secondary | ICD-10-CM | POA: Diagnosis not present

## 2020-11-11 DIAGNOSIS — J3081 Allergic rhinitis due to animal (cat) (dog) hair and dander: Secondary | ICD-10-CM | POA: Diagnosis not present

## 2020-11-11 DIAGNOSIS — J3089 Other allergic rhinitis: Secondary | ICD-10-CM | POA: Diagnosis not present

## 2020-11-11 DIAGNOSIS — J301 Allergic rhinitis due to pollen: Secondary | ICD-10-CM | POA: Diagnosis not present

## 2020-11-19 DIAGNOSIS — J301 Allergic rhinitis due to pollen: Secondary | ICD-10-CM | POA: Diagnosis not present

## 2020-11-19 DIAGNOSIS — J3081 Allergic rhinitis due to animal (cat) (dog) hair and dander: Secondary | ICD-10-CM | POA: Diagnosis not present

## 2020-11-19 DIAGNOSIS — J3089 Other allergic rhinitis: Secondary | ICD-10-CM | POA: Diagnosis not present

## 2020-12-01 DIAGNOSIS — J301 Allergic rhinitis due to pollen: Secondary | ICD-10-CM | POA: Diagnosis not present

## 2020-12-01 DIAGNOSIS — J3081 Allergic rhinitis due to animal (cat) (dog) hair and dander: Secondary | ICD-10-CM | POA: Diagnosis not present

## 2020-12-01 DIAGNOSIS — J3089 Other allergic rhinitis: Secondary | ICD-10-CM | POA: Diagnosis not present

## 2020-12-14 DIAGNOSIS — J3089 Other allergic rhinitis: Secondary | ICD-10-CM | POA: Diagnosis not present

## 2020-12-14 DIAGNOSIS — J301 Allergic rhinitis due to pollen: Secondary | ICD-10-CM | POA: Diagnosis not present

## 2020-12-14 DIAGNOSIS — J3081 Allergic rhinitis due to animal (cat) (dog) hair and dander: Secondary | ICD-10-CM | POA: Diagnosis not present

## 2020-12-16 ENCOUNTER — Other Ambulatory Visit: Payer: Self-pay | Admitting: Cardiology

## 2020-12-16 ENCOUNTER — Other Ambulatory Visit: Payer: Self-pay | Admitting: Family Medicine

## 2020-12-21 DIAGNOSIS — J3081 Allergic rhinitis due to animal (cat) (dog) hair and dander: Secondary | ICD-10-CM | POA: Diagnosis not present

## 2020-12-21 DIAGNOSIS — J3089 Other allergic rhinitis: Secondary | ICD-10-CM | POA: Diagnosis not present

## 2020-12-21 DIAGNOSIS — J301 Allergic rhinitis due to pollen: Secondary | ICD-10-CM | POA: Diagnosis not present

## 2020-12-25 DIAGNOSIS — J3081 Allergic rhinitis due to animal (cat) (dog) hair and dander: Secondary | ICD-10-CM | POA: Diagnosis not present

## 2020-12-25 DIAGNOSIS — J3089 Other allergic rhinitis: Secondary | ICD-10-CM | POA: Diagnosis not present

## 2020-12-25 DIAGNOSIS — J301 Allergic rhinitis due to pollen: Secondary | ICD-10-CM | POA: Diagnosis not present

## 2020-12-28 DIAGNOSIS — J3089 Other allergic rhinitis: Secondary | ICD-10-CM | POA: Diagnosis not present

## 2020-12-28 DIAGNOSIS — J301 Allergic rhinitis due to pollen: Secondary | ICD-10-CM | POA: Diagnosis not present

## 2020-12-28 DIAGNOSIS — J3081 Allergic rhinitis due to animal (cat) (dog) hair and dander: Secondary | ICD-10-CM | POA: Diagnosis not present

## 2021-01-05 DIAGNOSIS — J3089 Other allergic rhinitis: Secondary | ICD-10-CM | POA: Diagnosis not present

## 2021-01-05 DIAGNOSIS — J301 Allergic rhinitis due to pollen: Secondary | ICD-10-CM | POA: Diagnosis not present

## 2021-01-05 DIAGNOSIS — L2089 Other atopic dermatitis: Secondary | ICD-10-CM | POA: Diagnosis not present

## 2021-01-05 DIAGNOSIS — J3081 Allergic rhinitis due to animal (cat) (dog) hair and dander: Secondary | ICD-10-CM | POA: Diagnosis not present

## 2021-01-12 DIAGNOSIS — J3081 Allergic rhinitis due to animal (cat) (dog) hair and dander: Secondary | ICD-10-CM | POA: Diagnosis not present

## 2021-01-12 DIAGNOSIS — J301 Allergic rhinitis due to pollen: Secondary | ICD-10-CM | POA: Diagnosis not present

## 2021-01-12 DIAGNOSIS — J3089 Other allergic rhinitis: Secondary | ICD-10-CM | POA: Diagnosis not present

## 2021-01-18 IMAGING — MR MR HEAD W/O CM
10 series · 48 of 48 positions shown · non-contrast
Comparison: None.

CLINICAL DATA: Vertigo.

EXAM:
MRI HEAD WITHOUT CONTRAST
TECHNIQUE: Multiplanar, multiecho pulse sequences of the brain and surrounding
structures were obtained without intravenous contrast.

[Series 2: T1 · sagittal · 5.0mm · 0.45mm/px · 2 of 21 slices shown]
[im 1/21]
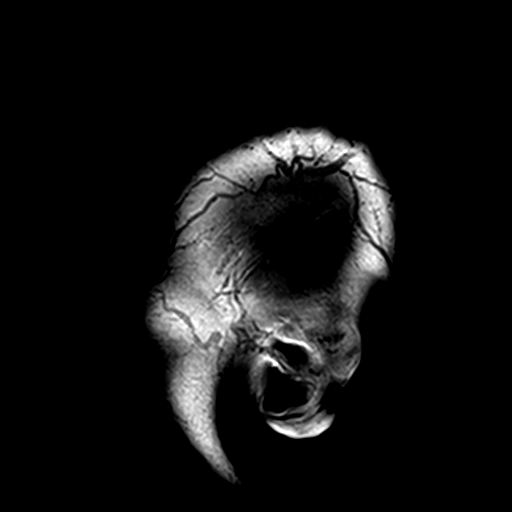
[im 21/21]
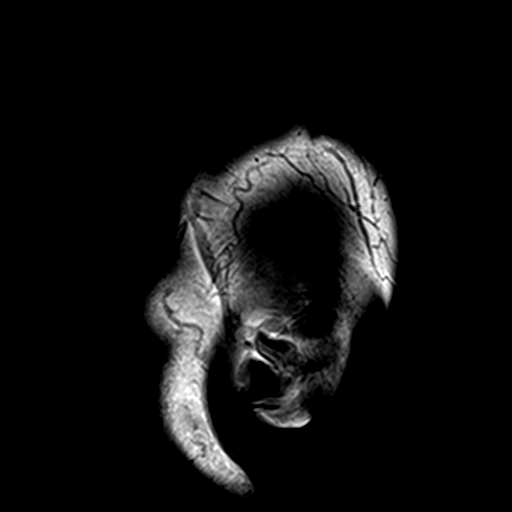

[Series 3: DWI · axial · 3.0mm · 1.80mm/px · z∈[-48,+98]mm · 9 of 100 slices shown (1 of 4)]
[im 1/100]
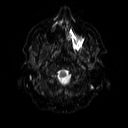
[im 13/100]
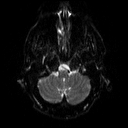
[im 25/100]
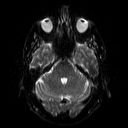
[im 38/100]
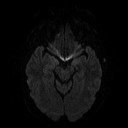
[im 50/100]
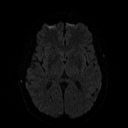
[im 62/100]
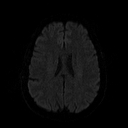
[im 75/100]
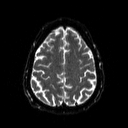
[im 87/100]
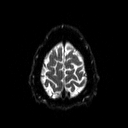
[im 100/100]
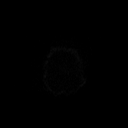

[Series 4: DWI · axial · 3.0mm · 1.80mm/px · z∈[-48,+98]mm · 4 of 46 slices shown (2 of 4)]
[im 1/46]
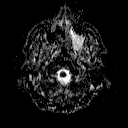
[im 16/46]
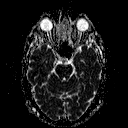
[im 31/46]
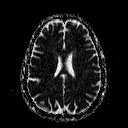
[im 46/46]
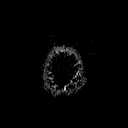

[Series 5: DWI · coronal · 5.0mm · 1.80mm/px · 6 of 68 slices shown (3 of 4)]
[im 1/68]
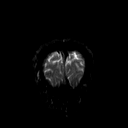
[im 14/68]
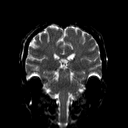
[im 27/68]
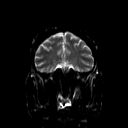
[im 41/68]
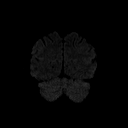
[im 54/68]
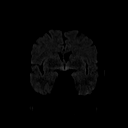
[im 68/68]
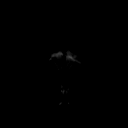

[Series 6: DWI · coronal · 5.0mm · 1.80mm/px · 3 of 34 slices shown (4 of 4)]
[im 1/34]
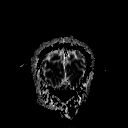
[im 17/34]
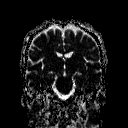
[im 34/34]
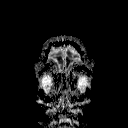

[Series 7: T2 · axial · 5.0mm · 0.51mm/px · z∈[-46,+100]mm · 2 of 22 slices shown (1 of 2)]
[im 1/22]
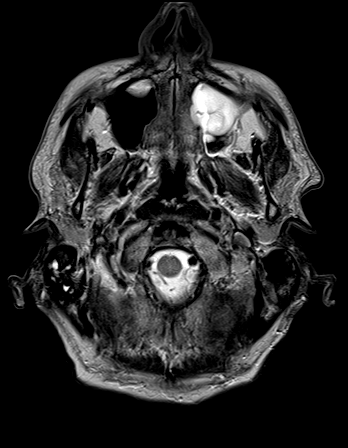
[im 22/22]
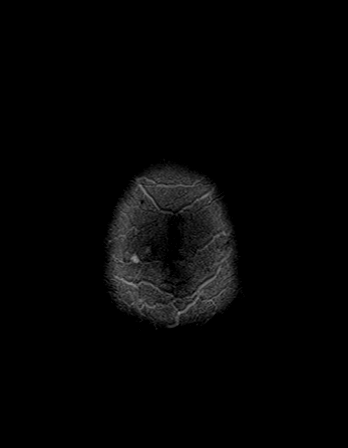

[Series 8: FLAIR · axial · 3.0mm · 0.45mm/px · z∈[-47,+97]mm · 3 of 32 slices shown]
[im 1/32]
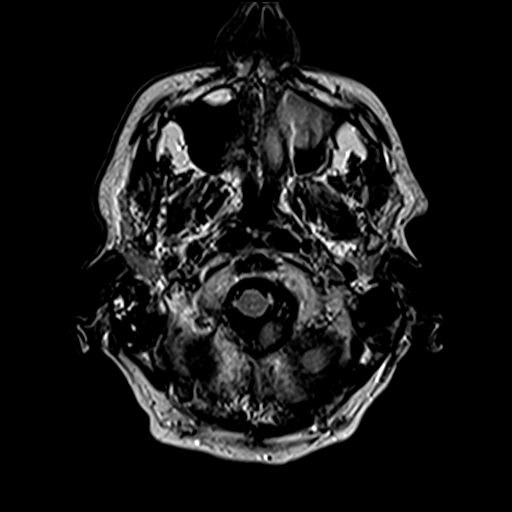
[im 16/32]
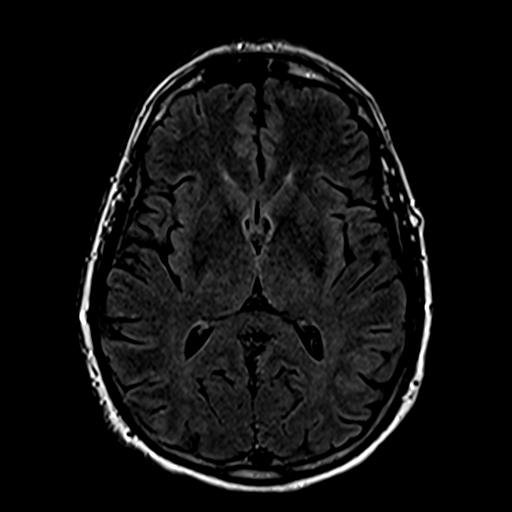
[im 32/32]
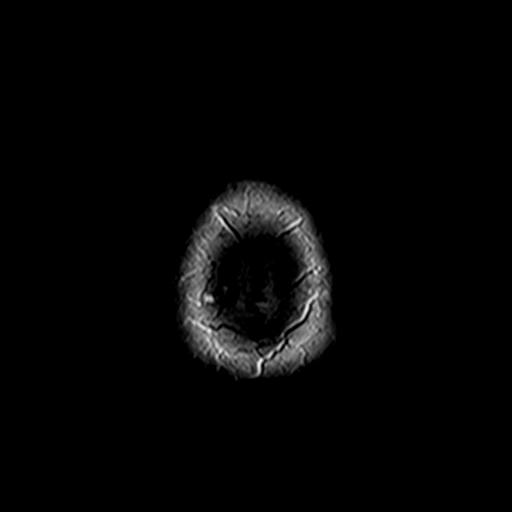

[Series 10: swi_images · axial · 4.0mm · 0.90mm/px · z∈[-52,+103]mm · 4 of 40 slices shown]
[im 1/40]
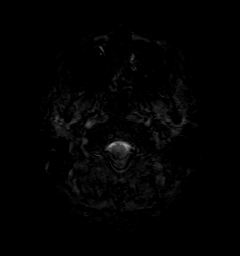
[im 14/40]
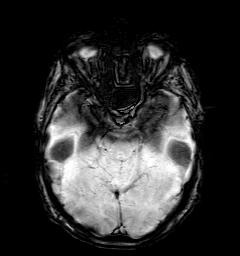
[im 27/40]
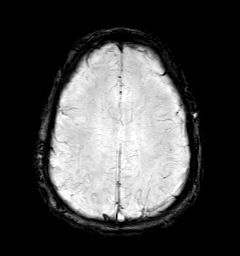
[im 40/40]
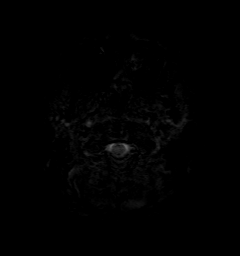

[Series 11: t1_mpr_tra · axial · 1.0mm · 0.71mm/px · z∈[-44,+98]mm · 13 of 144 slices shown]
[im 1/144]
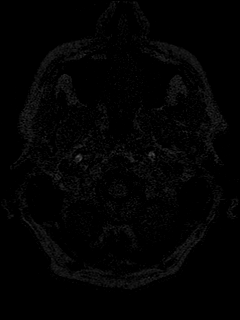
[im 12/144]
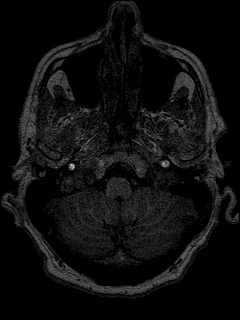
[im 24/144]
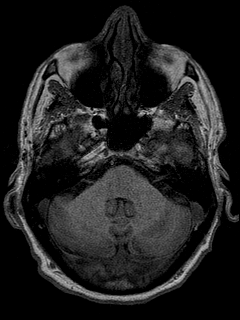
[im 36/144]
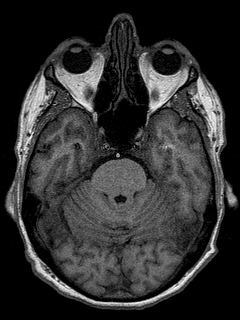
[im 48/144]
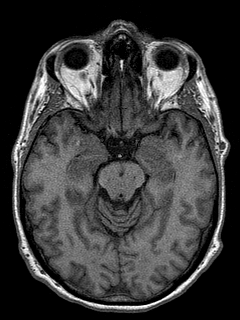
[im 60/144]
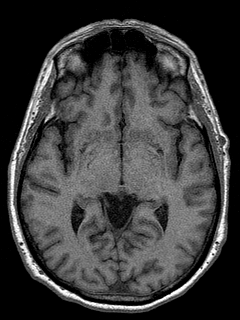
[im 72/144]
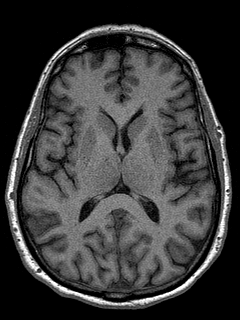
[im 84/144]
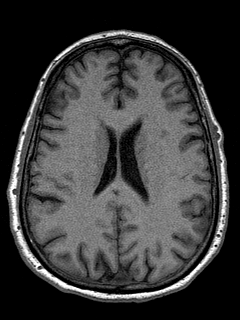
[im 96/144]
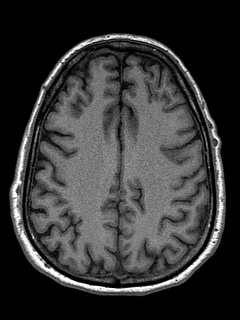
[im 108/144]
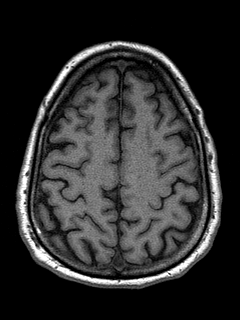
[im 120/144]
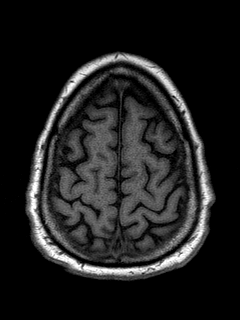
[im 132/144]
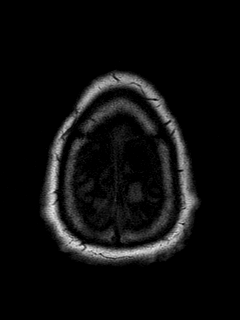
[im 144/144]
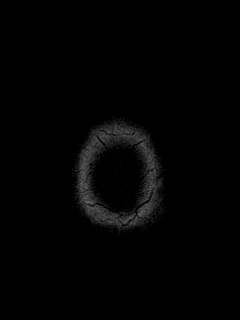

[Series 12: T2 · coronal · 5.0mm · 0.45mm/px · 2 of 25 slices shown (2 of 2)]
[im 1/25]
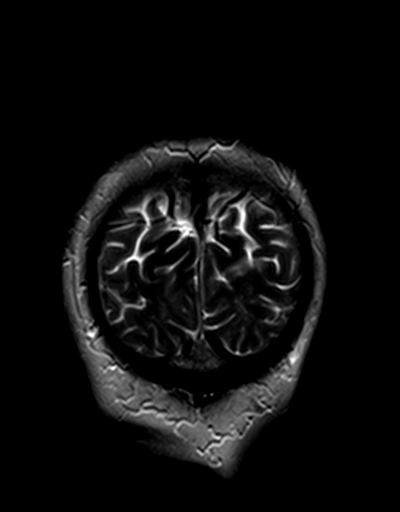
[im 25/25]
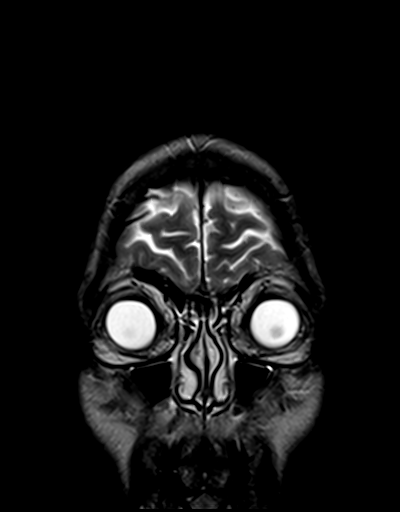

[48 of 48 positions shown; findings below may reference images not displayed]

FINDINGS: Brain: There is no significant cerebral volume loss. Minimal
scattered T2/FLAIR hyperintense foci involving the periventricular
and subcortical white matter are nonspecific however commonly
associated with chronic microvascular ischemic changes.

No acute infarct or intracranial hemorrhage. No midline shift,
ventriculomegaly or extra-axial fluid collection. No mass lesion.
The bilateral internal auditory canals and cerebellopontine angles
are grossly unremarkable. Limited evaluation of the temporal bones
demonstrates grossly normal bilateral inner ear morphology.

Vascular: Normal flow voids.

Skull and upper cervical spine: Normal marrow signal.

Sinuses/Orbits: Normal orbits. Bilateral maxillary sinus disease.
Trace right mastoid effusion.

Other: None.
IMPRESSION: No acute intracranial process. Minimal chronic microvascular
ischemic changes.

Grossly normal appearance of the internal auditory canals and
cerebellopontine angles. If suspicion persists consider MRI brain
(IAC protocol).

Bilateral maxillary sinus disease.  Trace right mastoid effusion.

## 2021-01-26 DIAGNOSIS — J3081 Allergic rhinitis due to animal (cat) (dog) hair and dander: Secondary | ICD-10-CM | POA: Diagnosis not present

## 2021-01-26 DIAGNOSIS — J3089 Other allergic rhinitis: Secondary | ICD-10-CM | POA: Diagnosis not present

## 2021-01-26 DIAGNOSIS — J301 Allergic rhinitis due to pollen: Secondary | ICD-10-CM | POA: Diagnosis not present

## 2021-02-10 DIAGNOSIS — J3089 Other allergic rhinitis: Secondary | ICD-10-CM | POA: Diagnosis not present

## 2021-02-10 DIAGNOSIS — J301 Allergic rhinitis due to pollen: Secondary | ICD-10-CM | POA: Diagnosis not present

## 2021-02-10 DIAGNOSIS — J3081 Allergic rhinitis due to animal (cat) (dog) hair and dander: Secondary | ICD-10-CM | POA: Diagnosis not present

## 2021-02-18 DIAGNOSIS — J301 Allergic rhinitis due to pollen: Secondary | ICD-10-CM | POA: Diagnosis not present

## 2021-02-18 DIAGNOSIS — J3089 Other allergic rhinitis: Secondary | ICD-10-CM | POA: Diagnosis not present

## 2021-02-18 DIAGNOSIS — J3081 Allergic rhinitis due to animal (cat) (dog) hair and dander: Secondary | ICD-10-CM | POA: Diagnosis not present

## 2021-03-01 ENCOUNTER — Ambulatory Visit (INDEPENDENT_AMBULATORY_CARE_PROVIDER_SITE_OTHER): Payer: PPO | Admitting: Family Medicine

## 2021-03-01 ENCOUNTER — Other Ambulatory Visit: Payer: Self-pay

## 2021-03-01 ENCOUNTER — Encounter: Payer: Self-pay | Admitting: Family Medicine

## 2021-03-01 VITALS — BP 110/74 | HR 58 | Temp 97.9°F | Ht 69.0 in | Wt 165.0 lb

## 2021-03-01 DIAGNOSIS — E7800 Pure hypercholesterolemia, unspecified: Secondary | ICD-10-CM

## 2021-03-01 DIAGNOSIS — N401 Enlarged prostate with lower urinary tract symptoms: Secondary | ICD-10-CM | POA: Diagnosis not present

## 2021-03-01 DIAGNOSIS — G5793 Unspecified mononeuropathy of bilateral lower limbs: Secondary | ICD-10-CM | POA: Diagnosis not present

## 2021-03-01 DIAGNOSIS — R3912 Poor urinary stream: Secondary | ICD-10-CM | POA: Diagnosis not present

## 2021-03-01 DIAGNOSIS — Z0000 Encounter for general adult medical examination without abnormal findings: Secondary | ICD-10-CM | POA: Diagnosis not present

## 2021-03-01 DIAGNOSIS — K219 Gastro-esophageal reflux disease without esophagitis: Secondary | ICD-10-CM | POA: Diagnosis not present

## 2021-03-01 DIAGNOSIS — M79652 Pain in left thigh: Secondary | ICD-10-CM | POA: Diagnosis not present

## 2021-03-01 LAB — CBC WITH DIFFERENTIAL/PLATELET
Basophils Absolute: 0.1 10*3/uL (ref 0.0–0.1)
Basophils Relative: 0.9 % (ref 0.0–3.0)
Eosinophils Absolute: 0.1 10*3/uL (ref 0.0–0.7)
Eosinophils Relative: 2.2 % (ref 0.0–5.0)
HCT: 44.9 % (ref 39.0–52.0)
Hemoglobin: 15 g/dL (ref 13.0–17.0)
Lymphocytes Relative: 21.1 % (ref 12.0–46.0)
Lymphs Abs: 1.3 10*3/uL (ref 0.7–4.0)
MCHC: 33.3 g/dL (ref 30.0–36.0)
MCV: 91.7 fl (ref 78.0–100.0)
Monocytes Absolute: 0.5 10*3/uL (ref 0.1–1.0)
Monocytes Relative: 9 % (ref 3.0–12.0)
Neutro Abs: 4 10*3/uL (ref 1.4–7.7)
Neutrophils Relative %: 66.8 % (ref 43.0–77.0)
Platelets: 217 10*3/uL (ref 150.0–400.0)
RBC: 4.9 Mil/uL (ref 4.22–5.81)
RDW: 14.8 % (ref 11.5–15.5)
WBC: 6 10*3/uL (ref 4.0–10.5)

## 2021-03-01 LAB — LIPID PANEL
Cholesterol: 176 mg/dL (ref 0–200)
HDL: 85.5 mg/dL (ref >39.00)
LDL Cholesterol: 81 mg/dL (ref 0–99)
NonHDL: 90.16
Total CHOL/HDL Ratio: 2
Triglycerides: 47 mg/dL (ref 0.0–149.0)
VLDL: 9.4 mg/dL (ref 0.0–40.0)

## 2021-03-01 LAB — COMPREHENSIVE METABOLIC PANEL
ALT: 24 U/L (ref 0–53)
AST: 28 U/L (ref 0–37)
Albumin: 4.2 g/dL (ref 3.5–5.2)
Alkaline Phosphatase: 59 U/L (ref 39–117)
BUN: 19 mg/dL (ref 6–23)
CO2: 28 mEq/L (ref 19–32)
Calcium: 9 mg/dL (ref 8.4–10.5)
Chloride: 103 mEq/L (ref 96–112)
Creatinine, Ser: 0.86 mg/dL (ref 0.40–1.50)
GFR: 90.07 mL/min (ref >60.00)
Glucose, Bld: 88 mg/dL (ref 70–99)
Potassium: 4.5 mEq/L (ref 3.5–5.1)
Sodium: 138 mEq/L (ref 135–145)
Total Bilirubin: 1 mg/dL (ref 0.2–1.2)
Total Protein: 6.7 g/dL (ref 6.0–8.3)

## 2021-03-01 LAB — FOLATE: Folate: 18.5 ng/mL (ref >5.9)

## 2021-03-01 LAB — TSH: TSH: 3.78 u[IU]/mL (ref 0.35–5.50)

## 2021-03-01 LAB — CK: Total CK: 96 U/L (ref 7–232)

## 2021-03-01 LAB — PSA: PSA: 1.29 ng/mL (ref 0.10–4.00)

## 2021-03-01 LAB — VITAMIN D 25 HYDROXY (VIT D DEFICIENCY, FRACTURES): VITD: 25.49 ng/mL — ABNORMAL LOW (ref 30.00–100.00)

## 2021-03-01 LAB — VITAMIN B12: Vitamin B-12: 349 pg/mL (ref 211–911)

## 2021-03-01 MED ORDER — ROSUVASTATIN CALCIUM 20 MG PO TABS: ORAL_TABLET | ORAL | 1 refills | Status: DC

## 2021-03-01 MED ORDER — TADALAFIL 5 MG PO TABS: 5.0000 mg | ORAL_TABLET | Freq: Every day | ORAL | 3 refills | Status: DC

## 2021-03-01 NOTE — Progress Notes (Signed)
Stanley Bennett DOB: June 04, 1956 Encounter date: 03/01/2021  This is a 65 y.o. male who presents for complete physical   History of present illness/Additional concerns: Still walking 6.5-7.5 miles/day. Has been getting some left thigh pain; doesn't note with activity or walking; not worse after, but it Is worse after sitting. Feels like knot in mid back of thigh and there is radiation into buttocks and down to toes. He does have chronic neuropathy - hands, feet. No swelling noted in leg, no changes in skin or bruising, feels like knot,but not had something palpable.  GERD: has been under control with protonix 52m.   HL: tolerating crestor.   BPV: has been generally under control. Not coming much; notes just with laying down a little spin and then goes away.   Seasonal allergies: well controlled; saw allergy recently and they are moving shots every other week. Still taking claritin, flonase.   Erectile dysfunction: cialis daily  Gout: no flares since we last talked.   Does see derm regularly - [place] [facility]. Had basal cell removed this year.    Past Medical History:  Diagnosis Date   Allergy    seasonal allergies   Arthritis    generalized   Basal cell carcinoma    scalp   GERD (gastroesophageal reflux disease)    on meds   Gout    on gout   Hyperlipidemia    on meds   Past Surgical History:  Procedure Laterality Date   APPENDECTOMY     BASAL CELL CARCINOMA EXCISION     CERVICAL DISC SURGERY     COLONOSCOPY  05/2017   MAC-suprep(exc)-polyps   TONSILLECTOMY     UPPER GASTROINTESTINAL ENDOSCOPY  05/2017   normal   VASECTOMY     WISDOM TOOTH EXTRACTION     Allergies  Allergen Reactions   Teriflunomide Other (See Comments)   Tamiflu [Oseltamivir] Rash    Mild rash 2018   Current Meds  Medication Sig   allopurinol (ZYLOPRIM) 300 MG tablet TAKE 1 TABLET(300 MG) BY MOUTH DAILY AS DIRECTED   aspirin EC 81 MG tablet Take 1 tablet (81 mg total) by mouth daily.    EPINEPHrine 0.3 mg/0.3 mL IJ SOAJ injection SMARTSIG:Injection As Directed   fluticasone (FLONASE) 50 MCG/ACT nasal spray Place 1 spray into both nostrils 2 (two) times daily.    loratadine (CLARITIN) 10 MG tablet Take 10 mg by mouth daily.   pantoprazole (PROTONIX) 20 MG tablet TAKE 1 TABLET(20 MG) BY MOUTH DAILY BEFORE BREAKFAST   PRESCRIPTION MEDICATION Allergy shots every other week   [DISCONTINUED] tadalafil (CIALIS) 5 MG tablet Take 1 tablet (5 mg total) by mouth daily.   Social History   Tobacco Use   Smoking status: Never   Smokeless tobacco: Never  Substance Use Topics   Alcohol use: Yes    Alcohol/week: 5.0 standard drinks    Types: 5 Standard drinks or equivalent per week    Comment: 5   Family History  Problem Relation Age of Onset   Arthritis Mother        osteo   Arthritis Father        osteo   Heart disease Father        (pneumonia cause of death 948yrs)   Prostate cancer Father    Diabetes Sister    Ovarian cancer Sister    Other Brother        celiac sprue   Prostate cancer Brother [phone]  Kidney failure Cousin  pat cousin   Kidney failure Cousin        pat cousin   Colon cancer Neg Hx    Esophageal cancer Neg Hx    Rectal cancer Neg Hx    Colon polyps Neg Hx    Stomach cancer Neg Hx      Review of Systems  Constitutional:  Negative for activity change, appetite change, chills, fatigue, fever and unexpected weight change.  HENT:  Negative for congestion, ear pain, hearing loss, sinus pressure, sinus pain, sore throat and trouble swallowing.   Eyes:  Negative for pain and visual disturbance.  Respiratory:  Negative for cough, chest tightness, shortness of breath and wheezing.   Cardiovascular:  Negative for chest pain, palpitations and leg swelling.  Gastrointestinal:  Negative for abdominal distention, abdominal pain, blood in stool, constipation, diarrhea, nausea and vomiting.  Genitourinary:  Negative for decreased urine volume, difficulty  urinating, dysuria, penile pain and testicular pain.  Musculoskeletal:  Negative for arthralgias, back pain and joint swelling. Myalgias: see hpi. Skin:  Negative for rash.  Neurological:  Negative for dizziness, weakness, numbness and headaches.  Hematological:  Negative for adenopathy. Does not bruise/bleed easily.  Psychiatric/Behavioral:  Negative for agitation, sleep disturbance and suicidal ideas. The patient is not nervous/anxious.    CBC:  Lab Results  Component Value Date   WBC 6.0 03/01/2021   HGB 15.0 03/01/2021   HCT 44.9 03/01/2021   MCHC 33.3 03/01/2021   RDW 14.8 03/01/2021   PLT 217.0 03/01/2021   CMP: Lab Results  Component Value Date   NA 138 03/01/2021   K 4.5 03/01/2021   CL 103 03/01/2021   CO2 28 03/01/2021   GLUCOSE 88 03/01/2021   BUN 19 03/01/2021   CREATININE 0.86 03/01/2021   GFRAA  07/02/2009    >60        The eGFR has been calculated using the MDRD equation. This calculation has not been validated in all clinical situations. eGFR's persistently <60 mL/min signify possible Chronic Kidney Disease.   CALCIUM 9.0 03/01/2021   PROT 6.7 03/01/2021   BILITOT 1.0 03/01/2021   ALKPHOS 59 03/01/2021   ALT 24 03/01/2021   AST 28 03/01/2021   LIPID: Lab Results  Component Value Date   CHOL 176 03/01/2021   TRIG 47.0 03/01/2021   HDL 85.50 03/01/2021   LDLCALC 81 03/01/2021    Objective:  BP 110/74 (BP Location: Left Arm, Patient Position: Sitting, Cuff Size: Normal)   Pulse (!) 58   Temp 97.9 F (36.6 C) (Oral)   Ht 5' 9" (1.753 m)   Wt 165 lb (74.8 kg)   SpO2 97%   BMI 24.37 kg/m   Weight: 165 lb (74.8 kg)   BP Readings from Last 3 Encounters:  03/01/21 110/74  08/25/20 (!) 143/77  03/25/20 90/60   Wt Readings from Last 3 Encounters:  03/01/21 165 lb (74.8 kg)  08/25/20 165 lb (74.8 kg)  07/30/20 165 lb (74.8 kg)    Physical Exam Constitutional:      General: He is not in acute distress.    Appearance: He is  well-developed.  HENT:     Head: Normocephalic and atraumatic.     Right Ear: External ear normal.     Left Ear: External ear normal.     Nose: Nose normal.     Mouth/Throat:     Pharynx: No oropharyngeal exudate.  Eyes:     Conjunctiva/sclera: Conjunctivae normal.     Pupils: Pupils are equal,  round, and reactive to light.  Neck:     Thyroid: No thyromegaly.  Cardiovascular:     Rate and Rhythm: Normal rate and regular rhythm.     Heart sounds: Normal heart sounds. No murmur heard.   No friction rub. No gallop.  Pulmonary:     Effort: Pulmonary effort is normal. No respiratory distress.     Breath sounds: Normal breath sounds. No stridor. No wheezing or rales.  Abdominal:     General: Bowel sounds are normal.     Palpations: Abdomen is soft.  Genitourinary:    Prostate: Enlarged (4). Not tender and no nodules present.     Rectum: Guaiac result negative. No mass.  Musculoskeletal:        General: Normal range of motion.     Cervical back: Neck supple.     Comments: Normal appearance posterior legs; symmetric. There is tenderness to palpation mid-upper left hamstring. No palpable abnormality.   Skin:    General: Skin is warm and dry.  Neurological:     Mental Status: He is alert and oriented to person, place, and time.     Deep Tendon Reflexes:     Reflex Scores:      Bicep reflexes are 2+ on the right side.      Brachioradialis reflexes are 2+ on the right side.      Patellar reflexes are 2+ on the right side.      Achilles reflexes are 2+ on the right side. Psychiatric:        Behavior: Behavior normal.        Thought Content: Thought content normal.        Judgment: Judgment normal.    Assessment/Plan: Health Maintenance Due  Topic Date Due   COVID-19 Vaccine (3 - Booster for Pfizer series) 02/19/2020   PNA vac Low Risk Adult (2 of 2 - PCV13) 02/18/2021   Health Maintenance reviewed.  1. Preventative health care Keep up with regular exercise and activity.    2. Pure hypercholesterolemia Has been well controlled. Recheck bloodwork today. Continue with crestor 48m.  - rosuvastatin (CRESTOR) 20 MG tablet; TAKE 1 TABLET(20 MG) BY MOUTH DAILY  Dispense: 90 tablet; Refill: 1 - Lipid panel; Future - Comprehensive metabolic panel; Future - Lipid panel - Comprehensive metabolic panel  3. Gastroesophageal reflux disease, unspecified whether esophagitis present Continue with protonix 248mdaily. Has sx without medication.   4. Benign prostatic hyperplasia with weak urinary stream Well controlled with cialis.  - tadalafil (CIALIS) 5 MG tablet; Take 1 tablet (5 mg total) by mouth daily.  Dispense: 90 tablet; Refill: 3 - PSA; Future - PSA  5. Pain of left thigh Start with some basic bloodwork; did advise short hold of statin; just in case related to muscle pain; but if not improving would consider sports med referral.  - VITAMIN D 25 Hydroxy (Vit-D Deficiency, Fractures); Future - CK; Future - VITAMIN D 25 Hydroxy (Vit-D Deficiency, Fractures) - CK  6. Neuropathy involving both lower extremities - Folate; Future - VITAMIN D 25 Hydroxy (Vit-D Deficiency, Fractures); Future - Vitamin B12; Future - TSH; Future - CBC with Differential/Platelet; Future - Folate - VITAMIN D 25 Hydroxy (Vit-D Deficiency, Fractures) - Vitamin B12 - TSH - CBC with Differential/Platelet   Return in about 1 year (around 03/01/2022) for Chronic condition visit, physical exam.  JuMicheline RoughMD

## 2021-03-02 ENCOUNTER — Encounter: Payer: Self-pay | Admitting: Family Medicine

## 2021-03-03 ENCOUNTER — Encounter: Payer: Self-pay | Admitting: Family Medicine

## 2021-03-05 DIAGNOSIS — J3089 Other allergic rhinitis: Secondary | ICD-10-CM | POA: Diagnosis not present

## 2021-03-05 DIAGNOSIS — J301 Allergic rhinitis due to pollen: Secondary | ICD-10-CM | POA: Diagnosis not present

## 2021-03-05 DIAGNOSIS — J3081 Allergic rhinitis due to animal (cat) (dog) hair and dander: Secondary | ICD-10-CM | POA: Diagnosis not present

## 2021-03-09 ENCOUNTER — Encounter: Payer: Self-pay | Admitting: Family Medicine

## 2021-03-09 DIAGNOSIS — J3081 Allergic rhinitis due to animal (cat) (dog) hair and dander: Secondary | ICD-10-CM | POA: Diagnosis not present

## 2021-03-09 DIAGNOSIS — J301 Allergic rhinitis due to pollen: Secondary | ICD-10-CM | POA: Diagnosis not present

## 2021-03-09 DIAGNOSIS — J3089 Other allergic rhinitis: Secondary | ICD-10-CM | POA: Diagnosis not present

## 2021-03-11 DIAGNOSIS — J301 Allergic rhinitis due to pollen: Secondary | ICD-10-CM | POA: Diagnosis not present

## 2021-03-11 DIAGNOSIS — J3081 Allergic rhinitis due to animal (cat) (dog) hair and dander: Secondary | ICD-10-CM | POA: Diagnosis not present

## 2021-03-11 DIAGNOSIS — J3089 Other allergic rhinitis: Secondary | ICD-10-CM | POA: Diagnosis not present

## 2021-03-16 DIAGNOSIS — J301 Allergic rhinitis due to pollen: Secondary | ICD-10-CM | POA: Diagnosis not present

## 2021-03-16 DIAGNOSIS — J3089 Other allergic rhinitis: Secondary | ICD-10-CM | POA: Diagnosis not present

## 2021-03-16 DIAGNOSIS — J3081 Allergic rhinitis due to animal (cat) (dog) hair and dander: Secondary | ICD-10-CM | POA: Diagnosis not present

## 2021-03-17 ENCOUNTER — Encounter: Payer: Self-pay | Admitting: Family Medicine

## 2021-03-17 DIAGNOSIS — M79652 Pain in left thigh: Secondary | ICD-10-CM

## 2021-03-23 DIAGNOSIS — J301 Allergic rhinitis due to pollen: Secondary | ICD-10-CM | POA: Diagnosis not present

## 2021-03-23 DIAGNOSIS — J3081 Allergic rhinitis due to animal (cat) (dog) hair and dander: Secondary | ICD-10-CM | POA: Diagnosis not present

## 2021-03-23 DIAGNOSIS — J3089 Other allergic rhinitis: Secondary | ICD-10-CM | POA: Diagnosis not present

## 2021-03-28 ENCOUNTER — Other Ambulatory Visit: Payer: Self-pay | Admitting: Family Medicine

## 2021-04-07 DIAGNOSIS — J301 Allergic rhinitis due to pollen: Secondary | ICD-10-CM | POA: Diagnosis not present

## 2021-04-07 DIAGNOSIS — J3089 Other allergic rhinitis: Secondary | ICD-10-CM | POA: Diagnosis not present

## 2021-04-07 DIAGNOSIS — J3081 Allergic rhinitis due to animal (cat) (dog) hair and dander: Secondary | ICD-10-CM | POA: Diagnosis not present

## 2021-04-08 ENCOUNTER — Ambulatory Visit: Payer: PPO | Admitting: Family Medicine

## 2021-04-08 ENCOUNTER — Ambulatory Visit (INDEPENDENT_AMBULATORY_CARE_PROVIDER_SITE_OTHER): Payer: PPO

## 2021-04-08 ENCOUNTER — Other Ambulatory Visit: Payer: Self-pay

## 2021-04-08 VITALS — BP 122/84 | HR 68 | Ht 69.0 in | Wt 168.0 lb

## 2021-04-08 DIAGNOSIS — M5441 Lumbago with sciatica, right side: Secondary | ICD-10-CM

## 2021-04-08 DIAGNOSIS — M542 Cervicalgia: Secondary | ICD-10-CM

## 2021-04-08 DIAGNOSIS — M5030 Other cervical disc degeneration, unspecified cervical region: Secondary | ICD-10-CM

## 2021-04-08 DIAGNOSIS — M545 Low back pain, unspecified: Secondary | ICD-10-CM | POA: Diagnosis not present

## 2021-04-08 DIAGNOSIS — G8929 Other chronic pain: Secondary | ICD-10-CM | POA: Diagnosis not present

## 2021-04-08 MED ORDER — VITAMIN D (ERGOCALCIFEROL) 1.25 MG (50000 UNIT) PO CAPS: 50000.0000 [IU] | ORAL_CAPSULE | ORAL | 0 refills | Status: DC

## 2021-04-08 MED ORDER — GABAPENTIN 100 MG PO CAPS: 200.0000 mg | ORAL_CAPSULE | Freq: Every day | ORAL | 0 refills | Status: DC

## 2021-04-08 NOTE — Progress Notes (Signed)
[place] Phone: [phone] Subjective:   Fontaine No, am serving as a scribe for Dr. Hulan Saas. This visit occurred during the SARS-CoV-2 public health emergency.  Safety protocols were in place, including screening questions prior to the visit, additional usage of staff PPE, and extensive cleaning of exam room while observing appropriate contact time as indicated for disinfecting solutions.   I'm seeing this patient by the request  of:  Koberlein, Steele Berg, MD  CC: neck and back pain   [id number]  Stanley Bennett is a 65 y.o. male coming in with complaint of L thigh pain. Patient states that he is having lower back pain, B hip pain over anterior aspect of hip. Pain in L hamstring is worst of all locations occurring when he is seated for prolonged. Less pain with movement. Walks 6 miles a day. Stretches and uses nsaids for pain relief.   Patient notes that his legs and arms are numb constantly. History of neck fusion surgery 12 years ago. States that this does feel different. Feels though that the neck never fullly resolved patient continued to have discomfort as well as some mild radicular symptoms but now seems to be also in the lower extremities and seems to be worsening.  Did review most recent labs from primary care provider and was found to have low vitamin D.    Past Medical History:  Diagnosis Date   Allergy    seasonal allergies   Arthritis    generalized   Basal cell carcinoma    scalp   GERD (gastroesophageal reflux disease)    on meds   Gout    on gout   Hyperlipidemia    on meds   Past Surgical History:  Procedure Laterality Date   APPENDECTOMY     BASAL CELL CARCINOMA EXCISION     CERVICAL DISC SURGERY     COLONOSCOPY  05/2017   MAC-suprep(exc)-polyps   TONSILLECTOMY     UPPER GASTROINTESTINAL ENDOSCOPY  05/2017   normal   VASECTOMY     WISDOM TOOTH EXTRACTION      Social History   Socioeconomic History   Marital status: Married    Spouse name: [name]   Number of children: 3   Years of education: Not on file   Highest education level: Not on file  Occupational History   Occupation: Development worker, international aid    Comment: retired  Tobacco Use   Smoking status: Never   Smokeless tobacco: Never  Vaping Use   Vaping Use: Never used  Substance and Sexual Activity   Alcohol use: Yes    Alcohol/week: 5.0 standard drinks    Types: 5 Standard drinks or equivalent per week    Comment: 5   Drug use: No   Sexual activity: Yes    Partners: Female  Other Topics Concern   Not on file  Social History Narrative   Work or School: [employer], 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      Home Situation: lives with wife      Spiritual Beliefs: Catholic      Lifestyle: regular exercise and healthy diet   Social Determinants of Radio broadcast assistant Strain: Low Risk    Difficulty of Paying Living Expenses: Not hard at all  Food Insecurity: No Food Insecurity   Worried About Charity fundraiser in the Last Year: Never true   [place] [employer] in the Last  Year: Never true  Transportation Needs: No Transportation Needs   Lack of Transportation (Medical): No   Lack of Transportation (Non-Medical): No  Physical Activity: Sufficiently Active   Days of Exercise per Week: 6 days   Minutes of Exercise per Session: 120 min  Stress: No Stress Concern Present   Feeling of Stress : Not at all  Social Connections: Moderately Integrated   Frequency of Communication with Friends and Family: Three times a week   Frequency of Social Gatherings with Friends and Family: More than three times a week   Attends Religious Services: More than 4 times per year   Active Member of [employer] or Organizations: No   Attends Music therapist: Never   Marital Status: Married   Allergies  Allergen Reactions   Teriflunomide Other (See Comments)   Tamiflu  [Oseltamivir] Rash    Mild rash 2018   Family History  Problem Relation Age of Onset   Arthritis Mother        osteo   Arthritis Father        osteo   Heart disease Father        (pneumonia cause of death 106 yrs)   Prostate cancer Father    Diabetes Sister    Ovarian cancer Sister    Other Brother        celiac sprue   Prostate cancer Brother 26   Kidney failure Cousin        pat cousin   Kidney failure Cousin        pat cousin   Colon cancer Neg Hx    Esophageal cancer Neg Hx    Rectal cancer Neg Hx    Colon polyps Neg Hx    Stomach cancer Neg Hx      Current Outpatient Medications (Cardiovascular):    EPINEPHrine 0.3 mg/0.3 mL IJ SOAJ injection, SMARTSIG:Injection As Directed   nitroGLYCERIN (NITROSTAT) 0.4 MG SL tablet, Place 1 tablet (0.4 mg total) under the tongue every 5 (five) minutes as needed for chest pain. Do not use within 48 ours of using cialis.   rosuvastatin (CRESTOR) 20 MG tablet, TAKE 1 TABLET(20 MG) BY MOUTH DAILY   tadalafil (CIALIS) 5 MG tablet, Take 1 tablet (5 mg total) by mouth daily.  Current Outpatient Medications (Respiratory):    fluticasone (FLONASE) 50 MCG/ACT nasal spray, Place 1 spray into both nostrils 2 (two) times daily.    loratadine (CLARITIN) 10 MG tablet, Take 10 mg by mouth daily.  Current Outpatient Medications (Analgesics):    allopurinol (ZYLOPRIM) 300 MG tablet, TAKE 1 TABLET(300 MG) BY MOUTH DAILY AS DIRECTED   aspirin EC 81 MG tablet, Take 1 tablet (81 mg total) by mouth daily.   Current Outpatient Medications (Other):    pantoprazole (PROTONIX) 20 MG tablet, TAKE 1 TABLET(20 MG) BY MOUTH DAILY BEFORE BREAKFAST   PRESCRIPTION MEDICATION, Allergy shots every other week   Reviewed prior external information including notes and imaging from  primary care provider As well as notes that were available from care everywhere and other healthcare systems.  Past medical history, social, surgical and family history all reviewed  in electronic medical record.  No pertanent information unless stated regarding to the chief complaint.   Review of Systems:  No headache, visual changes, nausea, vomiting, diarrhea, constipation, dizziness, abdominal pain, skin rash, fevers, chills, night sweats, weight loss, swollen lymph nodes, joint swelling, chest pain, shortness of breath, mood changes. POSITIVE muscle aches, body aches  Objective  There were  no vitals taken for this visit.   General: No apparent distress alert and oriented x3 mood and affect normal, dressed appropriately.  HEENT: Pupils equal, extraocular movements intact  Respiratory: Patient's speak in full sentences and does not appear short of breath  Cardiovascular: No lower extremity edema, non tender, no erythema  Gait normal with good balance and coordination.  MSK: Patient does have some mild stiffness in multiple areas.  Patient's low back does have loss of lordosis.  Tenderness to palpation in the paraspinal musculature of the lumbar spine right greater than left.  Positive FABER test. Patient's neck exam has severe loss of lordosis.  Mild positive Spurling's with mild radicular symptoms in the upper extremity bilaterally.  Grip strength though does seem to be intact but does have some mild weakness noted in the C8 distribution on the left side.  Patient may have some very mild weakness also with hip flexion on the left side.  Deep tendon reflexes do appear to be intact    Impression and Recommendations:     The above documentation has been reviewed and is accurate and complete Lyndal Pulley, DO

## 2021-04-08 NOTE — Patient Instructions (Signed)
Xray today Gabapentin '200mg'$  at night Once weekly Vit D  Tart cherry extract '1200mg'$  at night Exercises This will be slow and sweet See me in 5-6 weeks

## 2021-04-11 DIAGNOSIS — M5030 Other cervical disc degeneration, unspecified cervical region: Secondary | ICD-10-CM | POA: Insufficient documentation

## 2021-04-11 NOTE — Assessment & Plan Note (Signed)
Patient has had a history of fusion.  Patient may have some adjacent segment disease.  Mild weakness noted with the C8 distribution on the left side.  Increase activity slowly.  Gabapentin started.  X-rays pending but may need to consider advanced imaging if this continues.

## 2021-04-11 NOTE — Assessment & Plan Note (Signed)
Past medical history significant for gout.  Patient has had radicular symptoms but seems to be more on the left side at the moment.  I am concerned that potential gout could be playing a role.  Also concerned that there is a cervical myelopathy occurring with the C8 impingement.  Discussed with patient at great length.  Started on gabapentin, home exercises, over-the-counter medications that I think will be beneficial.  New x-rays pending and I do believe we will see adjacent segment disease of the cervical spine and may need to consider advanced imaging.  Follow-up again in 4 to 8 weeks

## 2021-04-12 DIAGNOSIS — J301 Allergic rhinitis due to pollen: Secondary | ICD-10-CM | POA: Diagnosis not present

## 2021-04-12 DIAGNOSIS — J3089 Other allergic rhinitis: Secondary | ICD-10-CM | POA: Diagnosis not present

## 2021-04-12 DIAGNOSIS — J3081 Allergic rhinitis due to animal (cat) (dog) hair and dander: Secondary | ICD-10-CM | POA: Diagnosis not present

## 2021-04-21 DIAGNOSIS — J3081 Allergic rhinitis due to animal (cat) (dog) hair and dander: Secondary | ICD-10-CM | POA: Diagnosis not present

## 2021-04-21 DIAGNOSIS — J3089 Other allergic rhinitis: Secondary | ICD-10-CM | POA: Diagnosis not present

## 2021-04-21 DIAGNOSIS — J301 Allergic rhinitis due to pollen: Secondary | ICD-10-CM | POA: Diagnosis not present

## 2021-05-05 DIAGNOSIS — J3089 Other allergic rhinitis: Secondary | ICD-10-CM | POA: Diagnosis not present

## 2021-05-05 DIAGNOSIS — J3081 Allergic rhinitis due to animal (cat) (dog) hair and dander: Secondary | ICD-10-CM | POA: Diagnosis not present

## 2021-05-05 DIAGNOSIS — J301 Allergic rhinitis due to pollen: Secondary | ICD-10-CM | POA: Diagnosis not present

## 2021-05-12 NOTE — Progress Notes (Signed)
Stanley Bennett [address] Time [place] Phone: [phone] Subjective:   Stanley Bennett, am serving as a scribe for Dr. Hulan Saas. This visit occurred during the SARS-CoV-2 public health emergency.  Safety protocols were in place, including screening questions prior to the visit, additional usage of staff PPE, and extensive cleaning of exam room while observing appropriate contact time as indicated for disinfecting solutions.   I'm seeing this patient by the request  of:  Caren Macadam, MD  CC: Neck pain follow-up  NOI:[id number]  04/08/2021 Patient has had a history of fusion.  Patient may have some adjacent segment disease.  Mild weakness noted with the C8 distribution on the left side.  Increase activity slowly.  Gabapentin started.  X-rays pending but may need to consider advanced imaging if this continues. Past medical history significant for gout.  Patient has had radicular symptoms but seems to be more on the left side at the moment.  I am concerned that potential gout could be playing a role.  Also concerned that there is a cervical myelopathy occurring with the C8 impingement.  Discussed with patient at great length.  Started on gabapentin, home exercises, over-the-counter medications that I think will be beneficial.  New x-rays pending and I do believe we will see adjacent segment disease of the cervical spine and may need to consider advanced imaging.  Follow-up again in 4 to 8 weeks  Updated 05/13/2021 Stanley Bennett is a 65 y.o. male coming in with complaint of pain behind his right scapula. Characterized as a constant ache he feels like something is stuck under his scapula. Sitting and playing the guitar gets uncomfortable. He has tried Advil and switched to Aleve, with no relief.  Xray IMPRESSION: 04/08/2021 Minor endplate bony spurring. Stable exam. No acute osseous finding.   Aortic Atherosclerosis  (ICD10-I70.0).  IMPRESSION: Stable ACDF with solid fusion at C5-6. Progressive degenerative disc disease above the hardware at C4-5.   Carotid atherosclerosis      Past Medical History:  Diagnosis Date   Allergy    seasonal allergies   Arthritis    generalized   Basal cell carcinoma    scalp   GERD (gastroesophageal reflux disease)    on meds   Gout    on gout   Hyperlipidemia    on meds   Past Surgical History:  Procedure Laterality Date   APPENDECTOMY     BASAL CELL CARCINOMA EXCISION     CERVICAL DISC SURGERY     COLONOSCOPY  05/2017   MAC-suprep(exc)-polyps   TONSILLECTOMY     UPPER GASTROINTESTINAL ENDOSCOPY  05/2017   normal   VASECTOMY     WISDOM TOOTH EXTRACTION     Social History   Socioeconomic History   Marital status: Married    Spouse name: [name]   Number of children: 3   Years of education: Not on file   Highest education level: Not on file  Occupational History   Occupation: Development worker, international aid    Comment: retired  Tobacco Use   Smoking status: Never   Smokeless tobacco: Never  Vaping Use   Vaping Use: Never used  Substance and Sexual Activity   Alcohol use: Yes    Alcohol/week: 5.0 standard drinks    Types: 5 Standard drinks or equivalent per week    Comment: 5   Drug use: No   Sexual activity: Yes    Partners: Female  Other Topics Concern  Not on file  Social History Narrative   Work or School: [employer], 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      Home Situation: lives with wife      Spiritual Beliefs: Catholic      Lifestyle: regular exercise and healthy diet   Social Determinants of Radio broadcast assistant Strain: Low Risk    Difficulty of Paying Living Expenses: Not hard at all  Food Insecurity: No Food Insecurity   Worried About Charity fundraiser in the Last Year: Never true   Arboriculturist in the Last Year: Never true  Transportation Needs: No Transportation Needs   Lack of Transportation (Medical):  No   Lack of Transportation (Non-Medical): No  Physical Activity: Sufficiently Active   Days of Exercise per Week: 6 days   Minutes of Exercise per Session: 120 min  Stress: No Stress Concern Present   Feeling of Stress : Not at all  Social Connections: Moderately Integrated   Frequency of Communication with Friends and Family: Three times a week   Frequency of Social Gatherings with Friends and Family: More than three times a week   Attends Religious Services: More than 4 times per year   Active Member of [employer] or Organizations: No   Attends Music therapist: Never   Marital Status: Married   Allergies  Allergen Reactions   Teriflunomide Other (See Comments)   Tamiflu [Oseltamivir] Rash    Mild rash 2018   Family History  Problem Relation Age of Onset   Arthritis Mother        osteo   Arthritis Father        osteo   Heart disease Father        (pneumonia cause of death 64 yrs)   Prostate cancer Father    Diabetes Sister    Ovarian cancer Sister    Other Brother        celiac sprue   Prostate cancer Brother 24   Kidney failure Cousin        pat cousin   Kidney failure Cousin        pat cousin   Colon cancer Neg Hx    Esophageal cancer Neg Hx    Rectal cancer Neg Hx    Colon polyps Neg Hx    Stomach cancer Neg Hx      Current Outpatient Medications (Cardiovascular):    EPINEPHrine 0.3 mg/0.3 mL IJ SOAJ injection, SMARTSIG:Injection As Directed   nitroGLYCERIN (NITROSTAT) 0.4 MG SL tablet, Place 1 tablet (0.4 mg total) under the tongue every 5 (five) minutes as needed for chest pain. Do not use within 48 ours of using cialis.   rosuvastatin (CRESTOR) 20 MG tablet, TAKE 1 TABLET(20 MG) BY MOUTH DAILY   tadalafil (CIALIS) 5 MG tablet, Take 1 tablet (5 mg total) by mouth daily.  Current Outpatient Medications (Respiratory):    fluticasone (FLONASE) 50 MCG/ACT nasal spray, Place 1 spray into both nostrils 2 (two) times daily.    loratadine (CLARITIN) 10  MG tablet, Take 10 mg by mouth daily.  Current Outpatient Medications (Analgesics):    allopurinol (ZYLOPRIM) 300 MG tablet, TAKE 1 TABLET(300 MG) BY MOUTH DAILY AS DIRECTED   aspirin EC 81 MG tablet, Take 1 tablet (81 mg total) by mouth daily.   Current Outpatient Medications (Other):    gabapentin (NEURONTIN) 100 MG capsule, Take 2 capsules (200 mg total) by mouth at bedtime.   pantoprazole (PROTONIX) 20 MG tablet, TAKE  1 TABLET(20 MG) BY MOUTH DAILY BEFORE BREAKFAST   PRESCRIPTION MEDICATION, Allergy shots every other week   Vitamin D, Ergocalciferol, (DRISDOL) 1.25 MG (50000 UNIT) CAPS capsule, Take 1 capsule (50,000 Units total) by mouth every 7 (seven) days.   Reviewed prior external information including notes and imaging from  primary care provider As well as notes that were available from care everywhere and other healthcare systems.  Past medical history, social, surgical and family history all reviewed in electronic medical record.  No pertanent information unless stated regarding to the chief complaint.   Review of Systems:  No headache, visual changes, nausea, vomiting, diarrhea, constipation, dizziness, abdominal pain, skin rash, fevers, chills, night sweats, weight loss, swollen lymph nodes, body aches, joint swelling, chest pain, shortness of breath, mood changes. POSITIVE muscle aches  Objective  Blood pressure 114/66, pulse (!) 57, height _0  (1.753 m), weight 170 lb (77.1 kg), SpO2 99 %.   General: No apparent distress alert and oriented x3 mood and affect normal, dressed appropriately.  HEENT: Pupils equal, extraocular movements intact  Respiratory: Patient's speak in full sentences and does not appear short of breath  Cardiovascular: No lower extremity edema, non tender, no erythema  Gait normal with good balance and coordination.  MSK: Right shoulder exam shows there are multiple trigger points at the inferior angle of the scapula noted.  Just medial to this.   Patient is severely tender. Neck exam does have some loss of lordosis.  Patient has pain with greater than 10 degrees of extension with radicular symptoms in the C8 distribution bilaterally.  Patient does have weakness in the C8 distribution bilaterally  After verbal consent patient was prepped with alcohol swab and with a 25-gauge half inch needle injecting 3cc of 0.5% Marcaine and 0.5 cc of Kenalog 40 mg/mL into 3 distinct trigger points on the right side.  Minimal blood loss.  Band-Aid placed.  Postinjection instructions given    Impression and Recommendations:     The above documentation has been reviewed and is accurate and complete Lyndal Pulley, DO

## 2021-05-13 ENCOUNTER — Ambulatory Visit: Payer: PPO | Admitting: Family Medicine

## 2021-05-13 ENCOUNTER — Other Ambulatory Visit: Payer: Self-pay

## 2021-05-13 ENCOUNTER — Encounter: Payer: Self-pay | Admitting: Family Medicine

## 2021-05-13 DIAGNOSIS — M25511 Pain in right shoulder: Secondary | ICD-10-CM

## 2021-05-13 DIAGNOSIS — M5030 Other cervical disc degeneration, unspecified cervical region: Secondary | ICD-10-CM

## 2021-05-13 NOTE — Patient Instructions (Addendum)
MRI cervical [id number] Injected trigger points today

## 2021-05-13 NOTE — Assessment & Plan Note (Signed)
Patient has a stone at the inferior angle of the scapula.  Patient responded relatively well.  Hopefully this will make some difference.  Patient has not noticed significant improvement with the gabapentin at this moment.  Posture and illness.  Follow-up with me again in 6 weeks.  Once again do feel it is more secondary to cervical radiculopathy and awaiting MRI of the cervical spine.

## 2021-05-13 NOTE — Assessment & Plan Note (Signed)
Patient has had a history of the fusion previously.  Patient does have adjacent segment disease noted on exam.  Continuing to have what seems to be more radicular symptoms again.  Patient is even having some mild weakness noted today.  He attempted some trigger point injections in the shoulder region secondary to the amount of discomfort but did not feel that it would be likely short-lived.  Patient will have MRI of the neck secondary to the weakness and as well as the findings on the x-ray and patient having this affecting daily activities.  Patient and will follow up with me again after imaging we will discuss the possibility for the epidural.

## 2021-05-14 ENCOUNTER — Encounter: Payer: Self-pay | Admitting: Family Medicine

## 2021-05-20 ENCOUNTER — Other Ambulatory Visit: Payer: Self-pay

## 2021-05-20 ENCOUNTER — Encounter: Payer: Self-pay | Admitting: Family Medicine

## 2021-05-20 DIAGNOSIS — M542 Cervicalgia: Secondary | ICD-10-CM

## 2021-05-20 NOTE — Telephone Encounter (Signed)
Patient called back follow up on his MRI order. I do not see any orders put in.  Can you put this order in?  Patient is going to call [facility] this afternoon to schedule.

## 2021-05-20 NOTE — Telephone Encounter (Signed)
Noted  

## 2021-05-20 NOTE — Telephone Encounter (Signed)
Order placed

## 2021-05-21 DIAGNOSIS — J3081 Allergic rhinitis due to animal (cat) (dog) hair and dander: Secondary | ICD-10-CM | POA: Diagnosis not present

## 2021-05-21 DIAGNOSIS — J3089 Other allergic rhinitis: Secondary | ICD-10-CM | POA: Diagnosis not present

## 2021-05-21 DIAGNOSIS — J301 Allergic rhinitis due to pollen: Secondary | ICD-10-CM | POA: Diagnosis not present

## 2021-05-27 ENCOUNTER — Other Ambulatory Visit: Payer: Self-pay

## 2021-05-27 ENCOUNTER — Ambulatory Visit
Admission: RE | Admit: 2021-05-27 | Discharge: 2021-05-27 | Disposition: A | Discharge status: Home / Self Care | Payer: PPO | Source: Ambulatory Visit | Attending: Family Medicine | Admitting: Family Medicine

## 2021-05-27 DIAGNOSIS — M542 Cervicalgia: Secondary | ICD-10-CM

## 2021-05-27 DIAGNOSIS — M4802 Spinal stenosis, cervical region: Secondary | ICD-10-CM | POA: Diagnosis not present

## 2021-06-07 ENCOUNTER — Encounter: Payer: Self-pay | Admitting: Family Medicine

## 2021-06-08 ENCOUNTER — Other Ambulatory Visit: Payer: Self-pay

## 2021-06-08 DIAGNOSIS — J3089 Other allergic rhinitis: Secondary | ICD-10-CM | POA: Diagnosis not present

## 2021-06-08 DIAGNOSIS — J301 Allergic rhinitis due to pollen: Secondary | ICD-10-CM | POA: Diagnosis not present

## 2021-06-08 DIAGNOSIS — J3081 Allergic rhinitis due to animal (cat) (dog) hair and dander: Secondary | ICD-10-CM | POA: Diagnosis not present

## 2021-06-08 DIAGNOSIS — M5030 Other cervical disc degeneration, unspecified cervical region: Secondary | ICD-10-CM

## 2021-06-09 ENCOUNTER — Ambulatory Visit: Payer: PPO | Attending: Family Medicine

## 2021-06-09 ENCOUNTER — Other Ambulatory Visit: Payer: Self-pay

## 2021-06-09 DIAGNOSIS — R293 Abnormal posture: Secondary | ICD-10-CM | POA: Insufficient documentation

## 2021-06-09 DIAGNOSIS — M542 Cervicalgia: Secondary | ICD-10-CM | POA: Insufficient documentation

## 2021-06-09 DIAGNOSIS — R252 Cramp and spasm: Secondary | ICD-10-CM | POA: Insufficient documentation

## 2021-06-09 NOTE — Patient Instructions (Addendum)
Access Code: [email or web address] URL: [email or web address] Date: 06/09/2021 Prepared by: Claiborne Billings  Exercises Seated Cervical Flexion AROM - 3 x daily - 7 x weekly - 1 sets - 3 reps - 20 hold Seated Cervical Sidebending AROM - 3 x daily - 7 x weekly - 1 sets - 3 reps - 20 hold Seated Cervical Rotation AROM - 3 x daily - 7 x weekly - 1 sets - 3 reps - 20 hold Seated Correct Posture - 1 x daily - 7 x weekly - 3 sets - 10 reps Seated Shoulder Setting - 5 x daily - 7 x weekly - 1 sets - 5 reps - 5 hold   URL: [email or web address] Date: 06/09/2021 Prepared by: Claiborne Billings  Exercises Seated Cervical Flexion AROM - 3 x daily - 7 x weekly - 1 sets - 3 reps - 20 hold Seated Cervical Sidebending AROM - 3 x daily - 7 x weekly - 1 sets - 3 reps - 20 hold Seated Cervical Rotation AROM - 3 x daily - 7 x weekly - 1 sets - 3 reps - 20 hold Seated Correct Posture - 1 x daily - 7 x weekly - 3 sets - 10 reps Seated Shoulder Setting - 5 x daily - 7 x weekly - 1 sets - 5 reps - 5 hold

## 2021-06-09 NOTE — Therapy (Signed)
[facility] @ [address], [place], [address] Phone: [phone]   Fax:  [phone]  Physical Therapy Evaluation  Patient Details  Name: Stanley Bennett MRN: [id number] Date of Birth: [birth year] Referring Provider (Stanley Bennett): Hulan Saas, MD   Encounter Date: 06/09/2021   Stanley Bennett End of Session - 06/09/21 1138     Visit Number 1    Date for Stanley Bennett Re-Evaluation 08/04/21    Authorization Type [employer]    Progress Note Due on Visit 10    Stanley Bennett Start Time 1101    Stanley Bennett Stop Time 1141    Stanley Bennett Time Calculation (min) 40 min    Activity Tolerance Patient tolerated treatment well    Behavior During Therapy [facility] for tasks assessed/performed             Past Medical History:  Diagnosis Date   Allergy    seasonal allergies   Arthritis    generalized   Basal cell carcinoma    scalp   GERD (gastroesophageal reflux disease)    on meds   Gout    on gout   Hyperlipidemia    on meds    Past Surgical History:  Procedure Laterality Date   APPENDECTOMY     BASAL CELL CARCINOMA EXCISION     CERVICAL DISC SURGERY     COLONOSCOPY  05/2017   MAC-suprep(exc)-polyps   TONSILLECTOMY     UPPER GASTROINTESTINAL ENDOSCOPY  05/2017   normal   VASECTOMY     WISDOM TOOTH EXTRACTION      There were no vitals filed for this visit.    Subjective Assessment - 06/09/21 1102     Subjective Stanley Bennett reports to Stanley Bennett with neck pain and numbness and shooting pain in bil arms. This pain is chronic has starting getting worse over the past 5-6 months.  Stanley Bennett has history of ACDF C5-6 in 2010.    Pertinent History ACDF C5-6 in 2010    Limitations Sitting    How long can you sit comfortably? sitting limited to 30 minutes- hamstring and low back limitations    Diagnostic tests MRI: progression to mild spinal canal stenosis C3-4    Patient Stated Goals stay active, figure out what is going on, reduce pain    Currently in Pain? Yes    Pain  Score 3    up to 6-7/10   Pain Location Neck    Pain Orientation Right;Left    Pain Descriptors / Indicators Sore;Pressure    Pain Type Chronic pain    Pain Radiating Towards bil UE: numbness/tingling    Pain Onset More than a month ago    Pain Frequency Constant    Aggravating Factors  no pattern reported by Stanley Bennett- just hurts    Pain Relieving Factors ignore it, Tylenol/Aleve                [facility] Stanley Bennett Assessment - 06/09/21 0001       Assessment   Medical Diagnosis DDD cervical    Referring Provider (Stanley Bennett) Hulan Saas, MD    Onset Date/Surgical Date --   chronic with flare up 4-5 month   Hand Dominance Right    Next MD Visit none    Prior Therapy none      Precautions   Precautions None      Restrictions   Weight Bearing Restrictions No      Balance Screen   Has the patient fallen in the past 6 months  No    Has the patient had a decrease in activity level because of a fear of falling?  No    Is the patient reluctant to leave their home because of a fear of falling?  No      Home Ecologist residence      Prior Function   Level of Independence Independent    Vocation Retired    Leisure walk 6-7 miles/day, gym, plays guitar      Cognition   Overall Cognitive Status Within Functional Limits for tasks assessed      Observation/Other Assessments   Focus on Therapeutic Outcomes (FOTO)  53 (goal 61)      Posture/Postural Control   Posture/Postural Control Postural limitations    Postural Limitations Forward head;Rounded Shoulders;Flexed trunk      ROM / Strength   AROM / PROM / Strength AROM;PROM;Strength      AROM   Overall AROM  Deficits    Overall AROM Comments Cervical rotation, sidebending and extension limited by 25% bil due to cervical fusion.  Tension reported at end range.  UE a/ROM is full.      PROM   Overall PROM  Deficits    Overall PROM Comments P/ROM limited by 25% due to cervical fusion.      Strength   Overall  Strength Deficits    Overall Strength Comments postural strength 4/5, UE strength 4+/5    Strength Assessment Site Hand    Right/Left hand Right;Left    Right Hand Grip (lbs) 76    Left Hand Grip (lbs) 87      Palpation   Spinal mobility C3-7 decreased mobility with mild pain, T5-T10 decreased mobility,    Palpation comment trigger points and tenderness in bil cervical paraspinals, upper traps and thoracic paraspinals      Transfers   Transfers Independent with all Transfers      Ambulation/Gait   Ambulation/Gait Yes    Ambulation/Gait Assistance 7: Independent                        Objective measurements completed on examination: See above findings.                Stanley Bennett Education - 06/09/21 1137     Education Details Access Code: [email or web address]    Person(s) Educated Patient    Methods Explanation;Demonstration;Handout    Comprehension Verbalized understanding;Returned demonstration              Stanley Bennett Short Term Goals - 06/09/21 1059       Stanley Bennett SHORT TERM GOAL #1   Title independent with initial HEP    Period Weeks    Status New    Target Date 07/07/21      Stanley Bennett SHORT TERM GOAL #2   Title report a 30% reduction in neck pain with daily tasks    Time 4    Period Weeks    Status New    Target Date 07/07/21      Stanley Bennett SHORT TERM GOAL #3   Title report frequent postural corrections to improve alignment with sitting and standing    Time 4    Period Weeks    Status New    Target Date 07/07/21               Stanley Bennett Long Term Goals - 06/09/21 1243       Stanley Bennett LONG TERM GOAL #1   Title The patient  will be indep with HEP and understand how to progress himself    Time 8    Period Weeks    Status New    Target Date 08/04/21      Stanley Bennett LONG TERM GOAL #2   Title improve FOTO to > or = to 61    Baseline 53    Time 8    Period Weeks    Status New    Target Date 08/04/21      Stanley Bennett LONG TERM GOAL #3   Title report > or = to 60% reduction in the frequency  and intensity of headaches and neck pain with daily tasks    Baseline --    Time 8    Period Weeks    Status New    Target Date 08/04/21      Stanley Bennett LONG TERM GOAL #4   Title demonstrate neutral posture with scapular retraction > or = to 75% of the time to reduce UE radiculopathy    Time 8    Period Weeks    Status New    Target Date 08/04/21                    Plan - 06/09/21 1228     Clinical Impression Statement Stanley Bennett present to Stanley Bennett with complaints of neck pain of a chronic nature.  Stanley Bennett has history of ACDF at C5-6.  Recent MRI showed progression of mild spinal canal stenosis at C3-4.  Stanley Bennett reports neck pain and bil UE radiculopathy that is chronic and has increased over the past few months.  Stanley Bennett rates the pain 2-3/10 today in the neck and up t o 6-7/10 at times.  Stanley Bennett describes numbness and tingling in bil hands and intermittent headaches.  Stanley Bennett with limited cervical A/ROM due to cervical fusion and reduced postural and Rt grip strength. Stanley Bennett with tension and trigger points in suboccipitals, cervical/thoracic paraspinals and upper traps bilaterally and reduced and tender PA mobility in the cervical spine.  Stanley Bennett will benefit from skilled Stanley Bennett to address neck pain, UE radiculopathy and headaches.    Personal Factors and Comorbidities Comorbidity 1    Comorbidities cervical fusion    Examination-Activity Limitations Carry;Lift    Stability/Clinical Decision Making Stable/Uncomplicated    Clinical Decision Making Low    Rehab Potential Good    Stanley Bennett Frequency 2x / week    Stanley Bennett Duration 8 weeks    Stanley Bennett Treatment/Interventions ADLs/Self Care Home Management;Cryotherapy;Electrical Stimulation;Moist Heat;Traction;Neuromuscular re-education;Therapeutic exercise;Therapeutic activities;Patient/family education;Manual techniques;Passive range of motion;Dry needling;Joint Manipulations;Spinal Manipulations;Taping    Stanley Bennett Next Visit Plan dry needling to neck, suboccipitals and upper traps, postual strength    Stanley Bennett Home  Exercise Plan Access Code: [email or web address]    Consulted and Agree with Plan of Care Patient             Patient will benefit from skilled therapeutic intervention in order to improve the following deficits and impairments:  Decreased strength, Postural dysfunction, Impaired flexibility, Pain, Increased muscle spasms, Decreased range of motion  Visit Diagnosis: Cervicalgia - Plan: Stanley Bennett plan of care cert/re-cert  Cramp and spasm - Plan: Stanley Bennett plan of care cert/re-cert  Abnormal posture - Plan: Stanley Bennett plan of care cert/re-cert     Problem List Patient Active Problem List   Diagnosis Date Noted   Trigger point of right shoulder region 05/13/2021   DDD (degenerative disc disease), cervical 04/11/2021   Nonobstructive atherosclerosis of coronary artery 01/19/2020   Pure hypercholesterolemia 01/19/2020   Family history  of heart disease 01/19/2020   Chronic right-sided low back pain with right-sided sciatica 02/12/2019   Gout 08/07/2016   H/O seasonal allergies 08/07/2016   Hyperlipidemia 08/07/2016   Chronic pain of both knees 08/07/2016   Erectile dysfunction 08/05/2016   Gastroesophageal reflux disease 08/05/2016    Stanley Bennett, Stanley Bennett 06/09/21 12:49 PM   [facility] @ [address] [place], [place], [address] Phone: [phone]   Fax:  [phone]  Name: Stanley Bennett MRN: [id number] Date of Birth: 14-Sep-1955

## 2021-06-14 ENCOUNTER — Other Ambulatory Visit: Payer: Self-pay

## 2021-06-14 ENCOUNTER — Ambulatory Visit: Payer: PPO

## 2021-06-14 DIAGNOSIS — R252 Cramp and spasm: Secondary | ICD-10-CM

## 2021-06-14 DIAGNOSIS — M542 Cervicalgia: Secondary | ICD-10-CM | POA: Diagnosis not present

## 2021-06-14 DIAGNOSIS — R293 Abnormal posture: Secondary | ICD-10-CM

## 2021-06-14 NOTE — Therapy (Signed)
[place] [facility] @ [place] [place], [place], [address] Phone: [phone]   Fax:  [phone]  Physical Therapy Treatment  Patient Details  Name: Stanley Bennett MRN: [id number] Date of Birth: Oct 22, 1955 Referring Provider (PT): Hulan Saas, MD   Encounter Date: 06/14/2021   PT End of Session - 06/14/21 1144     Visit Number 2    Date for PT Re-Evaluation 08/04/21    Authorization Type [employer]    Progress Note Due on Visit 10    PT Start Time 1102    PT Stop Time 1143    PT Time Calculation (min) 41 min    Activity Tolerance Patient tolerated treatment well    Behavior During Therapy [facility] for tasks assessed/performed             Past Medical History:  Diagnosis Date   Allergy    seasonal allergies   Arthritis    generalized   Basal cell carcinoma    scalp   GERD (gastroesophageal reflux disease)    on meds   Gout    on gout   Hyperlipidemia    on meds    Past Surgical History:  Procedure Laterality Date   APPENDECTOMY     BASAL CELL CARCINOMA EXCISION     CERVICAL DISC SURGERY     COLONOSCOPY  05/2017   MAC-suprep(exc)-polyps   TONSILLECTOMY     UPPER GASTROINTESTINAL ENDOSCOPY  05/2017   normal   VASECTOMY     WISDOM TOOTH EXTRACTION      There were no vitals filed for this visit.   Subjective Assessment - 06/14/21 1103     Subjective I am doing well.  I am doing exercises.  No pain.    Patient Stated Goals stay active, figure out what is going on, reduce pain    Currently in Pain? Yes    Pain Location Neck    Pain Orientation Right;Left    Pain Descriptors / Indicators Sore;Pressure    Pain Type Chronic pain    Pain Onset More than a month ago    Pain Frequency Constant                               [facility] Adult PT Treatment/Exercise - 06/14/21 0001       Exercises   Exercises Neck;Shoulder      Neck Exercises: Stretches   Upper  Trapezius Stretch Left;Right;3 reps;20 seconds    Other Neck Stretches rotation and flexion 3x20 seconds      Manual Therapy   Manual Therapy Soft tissue mobilization;Myofascial release    Manual therapy comments skilled paplation and monitoring with DN today    Soft tissue mobilization bil upper traps, cervical paraspinals and suboccipitals              Trigger Point Dry Needling - 06/14/21 0001     Consent Given? Yes    Education Handout Provided Previously provided    Muscles Treated Head and Neck Oblique capitus;Upper trapezius;Levator scapulae;Cervical multifidi    Upper Trapezius Response Twitch reponse elicited;Palpable increased muscle length    Oblique Capitus Response Twitch response elicited;Palpable increased muscle length    Levator Scapulae Response Twitch response elicited;Palpable increased muscle length    Cervical multifidi Response Twitch reponse elicited;Palpable increased muscle length  PT Short Term Goals - 06/09/21 1059       PT SHORT TERM GOAL #1   Title independent with initial HEP    Period Weeks    Status New    Target Date 07/07/21      PT SHORT TERM GOAL #2   Title report a 30% reduction in neck pain with daily tasks    Time 4    Period Weeks    Status New    Target Date 07/07/21      PT SHORT TERM GOAL #3   Title report frequent postural corrections to improve alignment with sitting and standing    Time 4    Period Weeks    Status New    Target Date 07/07/21               PT Long Term Goals - 06/09/21 1243       PT LONG TERM GOAL #1   Title The patient will be indep with HEP and understand how to progress himself    Time 8    Period Weeks    Status New    Target Date 08/04/21      PT LONG TERM GOAL #2   Title improve FOTO to > or = to 61    Baseline 53    Time 8    Period Weeks    Status New    Target Date 08/04/21      PT LONG TERM GOAL #3   Title report > or = to 60% reduction in  the frequency and intensity of headaches and neck pain with daily tasks    Baseline --    Time 8    Period Weeks    Status New    Target Date 08/04/21      PT LONG TERM GOAL #4   Title demonstrate neutral posture with scapular retraction > or = to 75% of the time to reduce UE radiculopathy    Time 8    Period Weeks    Status New    Target Date 08/04/21                   Plan - 06/14/21 1144     Clinical Impression Statement First time follow-up after evaluation. Pt is independent and compliant in HEP for cervical flexibility and pt is making postural corrections.  Pt had good twitch response and improved tissue mobility s/p DN and manual therapy today.  Pt with improved cervical mobility overall and reports increased ease with turning head.  Pt will continue to benefit from skilled PT to address strength, flexibility and postural endurance associated with bil UE pain.    PT Frequency 2x / week    PT Duration 8 weeks    PT Treatment/Interventions ADLs/Self Care Home Management;Cryotherapy;Electrical Stimulation;Moist Heat;Traction;Neuromuscular re-education;Therapeutic exercise;Therapeutic activities;Patient/family education;Manual techniques;Passive range of motion;Dry needling;Joint Manipulations;Spinal Manipulations;Taping    PT Next Visit Plan assess dry needling to neck, suboccipitals and upper traps, add postual strength. cervical mobility, mobs, P/ROM    PT Home Exercise Plan Access Code: [email or web address]    Consulted and Agree with Plan of Care Patient             Patient will benefit from skilled therapeutic intervention in order to improve the following deficits and impairments:  Decreased strength, Postural dysfunction, Impaired flexibility, Pain, Increased muscle spasms, Decreased range of motion  Visit Diagnosis: Cervicalgia  Cramp and spasm  Abnormal posture     Problem List  Patient Active Problem List   Diagnosis Date Noted   Trigger point of right  shoulder region 05/13/2021   DDD (degenerative disc disease), cervical 04/11/2021   Nonobstructive atherosclerosis of coronary artery 01/19/2020   Pure hypercholesterolemia 01/19/2020   Family history of heart disease 01/19/2020   Chronic right-sided low back pain with right-sided sciatica 02/12/2019   Gout 08/07/2016   H/O seasonal allergies 08/07/2016   Hyperlipidemia 08/07/2016   Chronic pain of both knees 08/07/2016   Erectile dysfunction 08/05/2016   Gastroesophageal reflux disease 08/05/2016   Sigurd Sos, PT 06/14/21 11:47 AM   [facility] @ [place], [place], [address] Phone: [phone]   Fax:  [phone]  Name: Stanley Bennett MRN: [id number] Date of Birth: May 27, 1956

## 2021-06-16 ENCOUNTER — Other Ambulatory Visit: Payer: Self-pay

## 2021-06-16 ENCOUNTER — Ambulatory Visit: Payer: PPO | Attending: Family Medicine

## 2021-06-16 DIAGNOSIS — M542 Cervicalgia: Secondary | ICD-10-CM | POA: Insufficient documentation

## 2021-06-16 DIAGNOSIS — R252 Cramp and spasm: Secondary | ICD-10-CM | POA: Diagnosis not present

## 2021-06-16 DIAGNOSIS — R293 Abnormal posture: Secondary | ICD-10-CM | POA: Insufficient documentation

## 2021-06-16 NOTE — Therapy (Signed)
[facility] @ [address], [place], [address] Phone: [phone]   Fax:  [phone]  Physical Therapy Treatment  Patient Details  Name: Stanley Bennett MRN: [id number] Date of Birth: 06-06-1956 Referring Provider (PT): Hulan Saas, MD   Encounter Date: 06/16/2021   PT End of Session - 06/16/21 1146     Visit Number 3    Date for PT Re-Evaluation 08/04/21    Authorization Type [employer]    Progress Note Due on Visit 10    PT Start Time 2993    PT Stop Time 1050    PT Time Calculation (min) 35 min    Activity Tolerance Patient tolerated treatment well    Behavior During Therapy [facility] for tasks assessed/performed             Past Medical History:  Diagnosis Date   Allergy    seasonal allergies   Arthritis    generalized   Basal cell carcinoma    scalp   GERD (gastroesophageal reflux disease)    on meds   Gout    on gout   Hyperlipidemia    on meds    Past Surgical History:  Procedure Laterality Date   APPENDECTOMY     BASAL CELL CARCINOMA EXCISION     CERVICAL DISC SURGERY     COLONOSCOPY  05/2017   MAC-suprep(exc)-polyps   TONSILLECTOMY     UPPER GASTROINTESTINAL ENDOSCOPY  05/2017   normal   VASECTOMY     WISDOM TOOTH EXTRACTION      There were no vitals filed for this visit.   Subjective Assessment - 06/16/21 1015     Subjective Patient states he is a little sore today but felt the DN was effective.  No pian.  Just soreness.    Pertinent History ACDF C5-6 in 2010    Limitations Sitting    How long can you sit comfortably? sitting limited to 30 minutes- hamstring and low back limitations    Diagnostic tests MRI: progression to mild spinal canal stenosis C3-4    Patient Stated Goals stay active, figure out what is going on, reduce pain    Currently in Pain? No/denies    Pain Score 0-No pain    Pain Onset More than a month ago                                [facility] Adult PT Treatment/Exercise - 06/16/21 0001       Exercises   Exercises Neck;Shoulder      Neck Exercises: Machines for Strengthening   UBE (Upper Arm Bike) Seat 11, L1 2 min fwd, 2 min reverse      Neck Exercises: Theraband   Shoulder Extension 20 reps;Green    Rows Green;20 reps    Shoulder External Rotation Red;20 reps    Horizontal ABduction Red;20 reps      Neck Exercises: Stretches   Upper Trapezius Stretch Left;Right;3 reps;20 seconds    Other Neck Stretches rotation and flexion 3x20 seconds      Shoulder Exercises: Prone   Horizontal ABduction 1 Strengthening;Both;20 reps   3 lb   Other Prone Exercises Extension both, 3 lb x20    Other Prone Exercises Row both 3 lb x 20      Manual Therapy   Manual Therapy Soft tissue mobilization;Myofascial release    Soft tissue mobilization bil upper traps, cervical paraspinals  and suboccipitals                     PT Education - 06/16/21 1034     Education Details Educated on shoulder stability and its effect on overuse of the upper traps and levator scapulae. Discussed equipment in the gym that would be appropriate at achieving what we worked on today.  He did not feel he needed handout for exercises today and felt he could replicate at the gym.              PT Short Term Goals - 06/09/21 1059       PT SHORT TERM GOAL #1   Title independent with initial HEP    Period Weeks    Status New    Target Date 07/07/21      PT SHORT TERM GOAL #2   Title report a 30% reduction in neck pain with daily tasks    Time 4    Period Weeks    Status New    Target Date 07/07/21      PT SHORT TERM GOAL #3   Title report frequent postural corrections to improve alignment with sitting and standing    Time 4    Period Weeks    Status New    Target Date 07/07/21               PT Long Term Goals - 06/09/21 1243       PT LONG TERM GOAL #1   Title The patient will be  indep with HEP and understand how to progress himself    Time 8    Period Weeks    Status New    Target Date 08/04/21      PT LONG TERM GOAL #2   Title improve FOTO to > or = to 61    Baseline 53    Time 8    Period Weeks    Status New    Target Date 08/04/21      PT LONG TERM GOAL #3   Title report > or = to 60% reduction in the frequency and intensity of headaches and neck pain with daily tasks    Baseline --    Time 8    Period Weeks    Status New    Target Date 08/04/21      PT LONG TERM GOAL #4   Title demonstrate neutral posture with scapular retraction > or = to 75% of the time to reduce UE radiculopathy    Time 8    Period Weeks    Status New    Target Date 08/04/21                   Plan - 06/16/21 1035     Clinical Impression Statement Patient seems to have done well with the dry needling.  He was able to complete all postural and shoulder exercises today with ease. Soft tissue appeared less restricted upon initiating soft tissue mobilization.  Cervical spine ROM WFL but upper traps would benefit from continued isolated stretching.  He is very compliant and well motivated.  He is working out at [employer] as well as walking approx 6 miles per day with his walking group.  He will focus on several machines that we discussed for postural and shoulder strengthening at the gym.    Personal Factors and Comorbidities Comorbidity 1    Examination-Activity Limitations Carry;Lift    Stability/Clinical Decision Making  Stable/Uncomplicated    Rehab Potential Good    PT Frequency 2x / week    PT Duration 8 weeks    PT Treatment/Interventions ADLs/Self Care Home Management;Cryotherapy;Electrical Stimulation;Moist Heat;Traction;Neuromuscular re-education;Therapeutic exercise;Therapeutic activities;Patient/family education;Manual techniques;Passive range of motion;Dry needling;Joint Manipulations;Spinal Manipulations;Taping    PT Next Visit Plan Continue dry needling to neck,  suboccipitals and upper traps.  Progress postural strengthening.    PT Home Exercise Plan Access Code: [email or web address]    Consulted and Agree with Plan of Care Patient             Patient will benefit from skilled therapeutic intervention in order to improve the following deficits and impairments:  Decreased strength, Postural dysfunction, Impaired flexibility, Pain, Increased muscle spasms, Decreased range of motion  Visit Diagnosis: Cervicalgia  Cramp and spasm  Abnormal posture     Problem List Patient Active Problem List   Diagnosis Date Noted   Trigger point of right shoulder region 05/13/2021   DDD (degenerative disc disease), cervical 04/11/2021   Nonobstructive atherosclerosis of coronary artery 01/19/2020   Pure hypercholesterolemia 01/19/2020   Family history of heart disease 01/19/2020   Chronic right-sided low back pain with right-sided sciatica 02/12/2019   Gout 08/07/2016   H/O seasonal allergies 08/07/2016   Hyperlipidemia 08/07/2016   Chronic pain of both knees 08/07/2016   Erectile dysfunction 08/05/2016   Gastroesophageal reflux disease 08/05/2016    [facility] [place] B. Okema Rollinson, PT 06/17/2211:07 PM   [facility] @ [place], [place], [address] Phone: [phone]   Fax:  [phone]  Name: MOHAMAD BRUSO MRN: [id number] Date of Birth: 03/05/1956

## 2021-06-21 ENCOUNTER — Ambulatory Visit: Payer: PPO

## 2021-06-21 ENCOUNTER — Other Ambulatory Visit: Payer: Self-pay

## 2021-06-21 DIAGNOSIS — R252 Cramp and spasm: Secondary | ICD-10-CM

## 2021-06-21 DIAGNOSIS — R293 Abnormal posture: Secondary | ICD-10-CM

## 2021-06-21 DIAGNOSIS — M542 Cervicalgia: Secondary | ICD-10-CM | POA: Diagnosis not present

## 2021-06-21 DIAGNOSIS — J3081 Allergic rhinitis due to animal (cat) (dog) hair and dander: Secondary | ICD-10-CM | POA: Diagnosis not present

## 2021-06-21 DIAGNOSIS — J3089 Other allergic rhinitis: Secondary | ICD-10-CM | POA: Diagnosis not present

## 2021-06-21 DIAGNOSIS — J301 Allergic rhinitis due to pollen: Secondary | ICD-10-CM | POA: Diagnosis not present

## 2021-06-21 NOTE — Patient Instructions (Signed)
Access Code: [email or web address] URL: [email or web address] Date: 06/21/2021 Prepared by: Claiborne Billings   Scapular Retraction with Resistance - 2 x daily - 7 x weekly - 2 sets - 10 reps Shoulder extension with resistance - Neutral - 2 x daily - 7 x weekly - 2 sets - 10 reps

## 2021-06-21 NOTE — Therapy (Signed)
[facility] @ [place] [place], [place], [address] Phone: [phone]   Fax:  [phone]  Physical Therapy Treatment  Patient Details  Name: Stanley Bennett MRN: [id number] Date of Birth: 1956-04-15 Referring Provider (PT): Hulan Saas, MD   Encounter Date: 06/21/2021   PT End of Session - 06/21/21 1138     Visit Number 4    Date for PT Re-Evaluation 08/04/21    Authorization Type [employer]    Progress Note Due on Visit 10    PT Start Time 1101    PT Stop Time 1136    PT Time Calculation (min) 35 min    Activity Tolerance Patient tolerated treatment well    Behavior During Therapy [facility] for tasks assessed/performed             Past Medical History:  Diagnosis Date   Allergy    seasonal allergies   Arthritis    generalized   Basal cell carcinoma    scalp   GERD (gastroesophageal reflux disease)    on meds   Gout    on gout   Hyperlipidemia    on meds    Past Surgical History:  Procedure Laterality Date   APPENDECTOMY     BASAL CELL CARCINOMA EXCISION     CERVICAL DISC SURGERY     COLONOSCOPY  05/2017   MAC-suprep(exc)-polyps   TONSILLECTOMY     UPPER GASTROINTESTINAL ENDOSCOPY  05/2017   normal   VASECTOMY     WISDOM TOOTH EXTRACTION      There were no vitals filed for this visit.   Subjective Assessment - 06/21/21 1105     Subjective I've been sore in the morning.  I have been rakingleaves.  My movement is better in my neck.  DN helped.    Currently in Pain? Yes    Pain Score 1     Pain Location Neck    Pain Orientation Left;Right    Pain Descriptors / Indicators Sore    Pain Type Chronic pain    Pain Onset More than a month ago    Pain Frequency Constant    Aggravating Factors  No consistent pattern    Pain Relieving Factors stretching, Tylenol/Aleve                               [facility] Adult PT Treatment/Exercise - 06/21/21 0001        Neck Exercises: Machines for Strengthening   UBE (Upper Arm Bike) Seat 11, L1 2 min fwd, 2 min reverse   PT present to discuss progress     Neck Exercises: Theraband   Shoulder Extension 20 reps;Green    Rows Green;20 reps      Manual Therapy   Manual Therapy Soft tissue mobilization;Myofascial release    Manual therapy comments skilled paplation and monitoring with DN today    Soft tissue mobilization bil upper traps, cervical paraspinals and suboccipitals              Trigger Point Dry Needling - 06/21/21 0001     Consent Given? Yes    Education Handout Provided Previously provided    Muscles Treated Head and Neck Oblique capitus;Upper trapezius;Levator scapulae;Cervical multifidi    Upper Trapezius Response Twitch reponse elicited;Palpable increased muscle length    Oblique Capitus Response Twitch response elicited;Palpable increased muscle length    Levator Scapulae Response Twitch response elicited;Palpable increased  muscle length    Cervical multifidi Response Twitch reponse elicited;Palpable increased muscle length                   PT Education - 06/21/21 1111     Education Details Access Code: [email or web address]    Person(s) Educated Patient    Methods Explanation;Demonstration;Handout    Comprehension Verbalized understanding;Returned demonstration              PT Short Term Goals - 06/09/21 1059       PT SHORT TERM GOAL #1   Title independent with initial HEP    Period Weeks    Status New    Target Date 07/07/21      PT SHORT TERM GOAL #2   Title report a 30% reduction in neck pain with daily tasks    Time 4    Period Weeks    Status New    Target Date 07/07/21      PT SHORT TERM GOAL #3   Title report frequent postural corrections to improve alignment with sitting and standing    Time 4    Period Weeks    Status New    Target Date 07/07/21               PT Long Term Goals - 06/09/21 1243       PT LONG TERM GOAL #1   Title The  patient will be indep with HEP and understand how to progress himself    Time 8    Period Weeks    Status New    Target Date 08/04/21      PT LONG TERM GOAL #2   Title improve FOTO to > or = to 61    Baseline 53    Time 8    Period Weeks    Status New    Target Date 08/04/21      PT LONG TERM GOAL #3   Title report > or = to 60% reduction in the frequency and intensity of headaches and neck pain with daily tasks    Baseline --    Time 8    Period Weeks    Status New    Target Date 08/04/21      PT LONG TERM GOAL #4   Title demonstrate neutral posture with scapular retraction > or = to 75% of the time to reduce UE radiculopathy    Time 8    Period Weeks    Status New    Target Date 08/04/21                   Plan - 06/21/21 1137     Clinical Impression Statement Pt is responding well to DN and reports improved tissue mobility and cervical A/ROM.  He was able to complete all postural and shoulder exercises today with ease. Pt did require minor tactile cues for scapular depression.  He is working out at [employer] as well as walking approx 6 miles per day with his walking group.  He will focus on several machines that we discussed for postural and shoulder strengthening at the gym.  Pt with good twitch response to DN and manual therapy and demonstrated improved tissue mobility after session.    PT Frequency 2x / week    PT Duration 8 weeks    PT Treatment/Interventions ADLs/Self Care Home Management;Cryotherapy;Electrical Stimulation;Moist Heat;Traction;Neuromuscular re-education;Therapeutic exercise;Therapeutic activities;Patient/family education;Manual techniques;Passive range of motion;Dry needling;Joint Manipulations;Spinal Manipulations;Taping    PT Next  Visit Plan Continue dry needling to neck, suboccipitals and upper traps.  Progress postural strengthening.  review HEP    PT Home Exercise Plan Access Code: [email or web address]    Consulted and Agree with Plan of Care Patient              Patient will benefit from skilled therapeutic intervention in order to improve the following deficits and impairments:  Decreased strength, Postural dysfunction, Impaired flexibility, Pain, Increased muscle spasms, Decreased range of motion  Visit Diagnosis: Cervicalgia  Cramp and spasm  Abnormal posture     Problem List Patient Active Problem List   Diagnosis Date Noted   Trigger point of right shoulder region 05/13/2021   DDD (degenerative disc disease), cervical 04/11/2021   Nonobstructive atherosclerosis of coronary artery 01/19/2020   Pure hypercholesterolemia 01/19/2020   Family history of heart disease 01/19/2020   Chronic right-sided low back pain with right-sided sciatica 02/12/2019   Gout 08/07/2016   H/O seasonal allergies 08/07/2016   Hyperlipidemia 08/07/2016   Chronic pain of both knees 08/07/2016   Erectile dysfunction 08/05/2016   Gastroesophageal reflux disease 08/05/2016  Sigurd Sos, PT 06/21/21 11:40 AM   [facility] @ [place] [place], [place], [address] Phone: [phone]   Fax:  [phone]  Name: Stanley Bennett MRN: [id number] Date of Birth: September 09, 1955

## 2021-06-23 ENCOUNTER — Ambulatory Visit: Payer: PPO

## 2021-06-23 ENCOUNTER — Other Ambulatory Visit: Payer: Self-pay

## 2021-06-23 DIAGNOSIS — R252 Cramp and spasm: Secondary | ICD-10-CM

## 2021-06-23 DIAGNOSIS — M542 Cervicalgia: Secondary | ICD-10-CM

## 2021-06-23 DIAGNOSIS — R293 Abnormal posture: Secondary | ICD-10-CM

## 2021-06-23 NOTE — Therapy (Signed)
[place] @ [facility] [place], [place], [address] Phone: [phone]   Fax:  [phone]  Physical Therapy Treatment  Patient Details  Name: Stanley Bennett MRN: [id number] Date of Birth: [birth year] Referring Provider (PT): Hulan Saas, MD   Encounter Date: 06/23/2021   PT End of Session - 06/23/21 1225     Visit Number 5    Date for PT Re-Evaluation 08/04/21    Authorization Type [employer]    Progress Note Due on Visit 10    PT Start Time 2979    PT Stop Time 1224    PT Time Calculation (min) 39 min    Activity Tolerance Patient tolerated treatment well    Behavior During Therapy [facility] for tasks assessed/performed             Past Medical History:  Diagnosis Date   Allergy    seasonal allergies   Arthritis    generalized   Basal cell carcinoma    scalp   GERD (gastroesophageal reflux disease)    on meds   Gout    on gout   Hyperlipidemia    on meds    Past Surgical History:  Procedure Laterality Date   APPENDECTOMY     BASAL CELL CARCINOMA EXCISION     CERVICAL DISC SURGERY     COLONOSCOPY  05/2017   MAC-suprep(exc)-polyps   TONSILLECTOMY     UPPER GASTROINTESTINAL ENDOSCOPY  05/2017   normal   VASECTOMY     WISDOM TOOTH EXTRACTION      There were no vitals filed for this visit.   Subjective Assessment - 06/23/21 1150     Subjective Patient states he is doing well.  He had no issues or soreness after last session.  He states he doesn't have any handweights at home so he wasnt able to do the new exercises.  He is still going to the gym and doing well with everything there.  Pain is still present but more intermittant.  He reports pain at 3-4/10 today.    Pertinent History ACDF C5-6 in 2010    Limitations Sitting    How long can you sit comfortably? sitting limited to 30 minutes- hamstring and low back limitations    Diagnostic tests MRI: progression to mild spinal canal  stenosis C3-4    Patient Stated Goals stay active, figure out what is going on, reduce pain    Currently in Pain? Yes    Pain Score 4     Pain Location Neck    Pain Orientation Upper;Medial    Pain Descriptors / Indicators Aching;Nagging;Discomfort    Pain Type Chronic pain    Pain Radiating Towards no changes in bil UE radicular symptoms but patient states this has been consistent for 10 years.    Pain Onset More than a month ago    Pain Frequency Intermittent                               [facility] Adult PT Treatment/Exercise - 06/23/21 0001       Neck Exercises: Machines for Strengthening   UBE (Upper Arm Bike) Seat 11, L3 2 min fwd, 2 min reverse   PT present to discuss progress     Neck Exercises: Theraband   Shoulder Extension 20 reps;Green    Rows Green;20 reps    Shoulder External Rotation 20 reps;Green    Horizontal ABduction  20 reps;Green      Shoulder Exercises: Supine   Protraction Strengthening;Both;20 reps    Protraction Weight (lbs) 4 lb      Shoulder Exercises: Prone   Horizontal ABduction 1 Strengthening;Both;20 reps   4lb   Other Prone Exercises Extension both, 4 lb x20    Other Prone Exercises Row both 4 lb x 20      Shoulder Exercises: Sidelying   External Rotation Strengthening;Both;20 reps                     PT Education - 06/23/21 1223     Education Details Added prone shoulder exercises to HEP and handouts provided.    Access Code: [facility]    Person(s) Educated Patient    Methods Explanation;Demonstration;Verbal cues;Handout    Comprehension Verbalized understanding;Returned demonstration;Verbal cues required              PT Short Term Goals - 06/09/21 1059       PT SHORT TERM GOAL #1   Title independent with initial HEP    Period Weeks    Status New    Target Date 07/07/21      PT SHORT TERM GOAL #2   Title report a 30% reduction in neck pain with daily tasks    Time 4    Period Weeks    Status New     Target Date 07/07/21      PT SHORT TERM GOAL #3   Title report frequent postural corrections to improve alignment with sitting and standing    Time 4    Period Weeks    Status New    Target Date 07/07/21               PT Long Term Goals - 06/09/21 1243       PT LONG TERM GOAL #1   Title The patient will be indep with HEP and understand how to progress himself    Time 8    Period Weeks    Status New    Target Date 08/04/21      PT LONG TERM GOAL #2   Title improve FOTO to > or = to 61    Baseline 53    Time 8    Period Weeks    Status New    Target Date 08/04/21      PT LONG TERM GOAL #3   Title report > or = to 60% reduction in the frequency and intensity of headaches and neck pain with daily tasks    Baseline --    Time 8    Period Weeks    Status New    Target Date 08/04/21      PT LONG TERM GOAL #4   Title demonstrate neutral posture with scapular retraction > or = to 75% of the time to reduce UE radiculopathy    Time 8    Period Weeks    Status New    Target Date 08/04/21                   Plan - 06/23/21 1225     Clinical Impression Statement Patient arrived with min complaints at a pain level of 3-4/10.  He had no issues with the addition of shoulder stabilization exercises.  He is working out consistently at [employer].  We added 1 1lb more to prone shoulder exercises, added sidelying ER and supine shoulder protraction.  He admits that the 4 lb  weight was more challenging but that he felt good doing the increased resistance.  He seems to be responding well to current plan of care,  He understands that his numbness will not likely change but he is hoping to avoid further surgery. He would benefit from continued skilled PT to address postural weakness.    Personal Factors and Comorbidities Comorbidity 1    Comorbidities cervical fusion    Examination-Activity Limitations Carry;Lift    Stability/Clinical Decision Making Stable/Uncomplicated     Rehab Potential Good    PT Frequency 2x / week    PT Duration 8 weeks    PT Treatment/Interventions ADLs/Self Care Home Management;Cryotherapy;Electrical Stimulation;Moist Heat;Traction;Neuromuscular re-education;Therapeutic exercise;Therapeutic activities;Patient/family education;Manual techniques;Passive range of motion;Dry needling;Joint Manipulations;Spinal Manipulations;Taping    PT Next Visit Plan Continue dry needling to neck, suboccipitals and upper traps.  Progress postural strengthening.  review HEP    PT Home Exercise Plan Access Code: [facility]             Patient will benefit from skilled therapeutic intervention in order to improve the following deficits and impairments:  Decreased strength, Postural dysfunction, Impaired flexibility, Pain, Increased muscle spasms, Decreased range of motion  Visit Diagnosis: Cervicalgia  Cramp and spasm  Abnormal posture     Problem List Patient Active Problem List   Diagnosis Date Noted   Trigger point of right shoulder region 05/13/2021   DDD (degenerative disc disease), cervical 04/11/2021   Nonobstructive atherosclerosis of coronary artery 01/19/2020   Pure hypercholesterolemia 01/19/2020   Family history of heart disease 01/19/2020   Chronic right-sided low back pain with right-sided sciatica 02/12/2019   Gout 08/07/2016   H/O seasonal allergies 08/07/2016   Hyperlipidemia 08/07/2016   Chronic pain of both knees 08/07/2016   Erectile dysfunction 08/05/2016   Gastroesophageal reflux disease 08/05/2016    Anderson Malta B. Tishanna Dunford, PT 06/24/2211:32 PM   [place] @ [place], [place], [address] Phone: [phone]   Fax:  [phone]  Name: JOB HOLTSCLAW MRN: [id number] Date of Birth: [birth year]

## 2021-06-23 NOTE — Patient Instructions (Signed)
Access Code: [email or web address] URL: [email or web address] Date: 06/23/2021 Prepared by: Candyce Churn  Exercises Seated Cervical Flexion AROM - 3 x daily - 7 x weekly - 1 sets - 3 reps - 20 hold Seated Cervical Sidebending AROM - 3 x daily - 7 x weekly - 1 sets - 3 reps - 20 hold Seated Cervical Rotation AROM - 3 x daily - 7 x weekly - 1 sets - 3 reps - 20 hold Seated Correct Posture - 1 x daily - 7 x weekly - 3 sets - 10 reps Seated Scapular Retraction - 5 x daily - 7 x weekly - 1 sets - 10 reps - 5 hold Scapular Retraction with Resistance - 2 x daily - 7 x weekly - 2 sets - 10 reps Shoulder extension with resistance - Neutral - 2 x daily - 7 x weekly - 2 sets - 10 reps Prone Shoulder Extension - Single Arm - 1 x daily - 7 x weekly - 1 sets - 20 reps Prone Shoulder Row - 1 x daily - 7 x weekly - 1 sets - 20 reps Prone Single Arm Shoulder Horizontal Abduction with Scapular Retraction and Palm Down - 1 x daily - 7 x weekly - 1 sets - 20 reps Sidelying Shoulder External Rotation Dumbbell - 1 x daily - 7 x weekly - 1 sets - 20 reps Supine Scapular Protraction in Flexion with Dumbbells - 1 x daily - 7 x weekly - 1 sets - 20 reps

## 2021-06-24 ENCOUNTER — Other Ambulatory Visit: Payer: Self-pay | Admitting: Family Medicine

## 2021-06-28 ENCOUNTER — Ambulatory Visit (INDEPENDENT_AMBULATORY_CARE_PROVIDER_SITE_OTHER): Payer: PPO

## 2021-06-28 ENCOUNTER — Ambulatory Visit: Payer: PPO

## 2021-06-28 ENCOUNTER — Other Ambulatory Visit: Payer: Self-pay

## 2021-06-28 VITALS — Ht 69.5 in | Wt 162.0 lb

## 2021-06-28 DIAGNOSIS — M542 Cervicalgia: Secondary | ICD-10-CM

## 2021-06-28 DIAGNOSIS — R293 Abnormal posture: Secondary | ICD-10-CM

## 2021-06-28 DIAGNOSIS — R252 Cramp and spasm: Secondary | ICD-10-CM

## 2021-06-28 DIAGNOSIS — Z0000 Encounter for general adult medical examination without abnormal findings: Secondary | ICD-10-CM | POA: Diagnosis not present

## 2021-06-28 NOTE — Patient Instructions (Signed)
[name] , Thank you for taking time to come for your Medicare Wellness Visit. I appreciate your ongoing commitment to your health goals. Please review the following plan we discussed and let me know if I can assist you in the future.   Screening recommendations/referrals: Colonoscopy: completed 08/25/2020 Recommended yearly ophthalmology/optometry visit for glaucoma screening and checkup Recommended yearly dental visit for hygiene and checkup  Vaccinations: Influenza vaccine: completed 05/17/2021 Pneumococcal vaccine: completed 02/19/2020 Tdap vaccine: completed 11/22/2016, due 11/23/2026 Shingles vaccine: completed   Covid-19:  05/17/2021, 12/07/2020, 11/20/2019, 09/22/2019, 09/04/2019  Advanced directives: Please bring a copy of your POA (Power of Attorney) and/or Living Will to your next appointment.   Conditions/risks identified: none  Next appointment: Follow up in one year for your annual wellness visit.   Preventive Care 26 Years and Older, Male Preventive care refers to lifestyle choices and visits with your health care provider that can promote health and wellness. What does preventive care include? A yearly physical exam. This is also called an annual well check. Dental exams once or twice a year. Routine eye exams. Ask your health care provider how often you should have your eyes checked. Personal lifestyle choices, including: Daily care of your teeth and gums. Regular physical activity. Eating a healthy diet. Avoiding tobacco and drug use. Limiting alcohol use. Practicing safe sex. Taking low doses of aspirin every day. Taking vitamin and mineral supplements as recommended by your health care provider. What happens during an annual well check? The services and screenings done by your health care provider during your annual well check will depend on your age, overall health, lifestyle risk factors, and family history of disease. Counseling  Your health care provider may ask  you questions about your: Alcohol use. Tobacco use. Drug use. Emotional well-being. Home and relationship well-being. Sexual activity. Eating habits. History of falls. Memory and ability to understand (cognition). Work and work Statistician. Screening  You may have the following tests or measurements: Height, weight, and BMI. Blood pressure. Lipid and cholesterol levels. These may be checked every 5 years, or more frequently if you are over 15 years old. Skin check. Lung cancer screening. You may have this screening every year starting at age 17 if you have a 30-pack-year history of smoking and currently smoke or have quit within the past 15 years. Fecal occult blood test (FOBT) of the stool. You may have this test every year starting at age 32. Flexible sigmoidoscopy or colonoscopy. You may have a sigmoidoscopy every 5 years or a colonoscopy every 10 years starting at age 48. Prostate cancer screening. Recommendations will vary depending on your family history and other risks. Hepatitis C blood test. Hepatitis B blood test. Sexually transmitted disease (STD) testing. Diabetes screening. This is done by checking your blood sugar (glucose) after you have not eaten for a while (fasting). You may have this done every 1-3 years. Abdominal aortic aneurysm (AAA) screening. You may need this if you are a current or former smoker. Osteoporosis. You may be screened starting at age 76 if you are at high risk. Talk with your health care provider about your test results, treatment options, and if necessary, the need for more tests. Vaccines  Your health care provider may recommend certain vaccines, such as: Influenza vaccine. This is recommended every year. Tetanus, diphtheria, and acellular pertussis (Tdap, Td) vaccine. You may need a Td booster every 10 years. Zoster vaccine. You may need this after age 32. Pneumococcal 13-valent conjugate (PCV13) vaccine. One dose  is recommended after age  65. Pneumococcal polysaccharide (PPSV23) vaccine. One dose is recommended after age 7. Talk to your health care provider about which screenings and vaccines you need and how often you need them. This information is not intended to replace advice given to you by your health care provider. Make sure you discuss any questions you have with your health care provider. Document Released: 08/28/2015 Document Revised: 04/20/2016 Document Reviewed: 06/02/2015 Elsevier Interactive Patient Education  2017 [place] Prevention in the Home Falls can cause injuries. They can happen to people of all ages. There are many things you can do to make your home safe and to help prevent falls. What can I do on the outside of my home? Regularly fix the edges of walkways and driveways and fix any cracks. Remove anything that might make you trip as you walk through a door, such as a raised step or threshold. Trim any bushes or trees on the path to your home. Use bright outdoor lighting. Clear any walking paths of anything that might make someone trip, such as rocks or tools. Regularly check to see if handrails are loose or broken. Make sure that both sides of any steps have handrails. Any raised decks and porches should have guardrails on the edges. Have any leaves, snow, or ice cleared regularly. Use sand or salt on walking paths during winter. Clean up any spills in your garage right away. This includes oil or grease spills. What can I do in the bathroom? Use night lights. Install grab bars by the toilet and in the tub and shower. Do not use towel bars as grab bars. Use non-skid mats or decals in the tub or shower. If you need to sit down in the shower, use a plastic, non-slip stool. Keep the floor dry. Clean up any water that spills on the floor as soon as it happens. Remove soap buildup in the tub or shower regularly. Attach bath mats securely with double-sided non-slip rug tape. Do not have throw  rugs and other things on the floor that can make you trip. What can I do in the bedroom? Use night lights. Make sure that you have a light by your bed that is easy to reach. Do not use any sheets or blankets that are too big for your bed. They should not hang down onto the floor. Have a firm chair that has side arms. You can use this for support while you get dressed. Do not have throw rugs and other things on the floor that can make you trip. What can I do in the kitchen? Clean up any spills right away. Avoid walking on wet floors. Keep items that you use a lot in easy-to-reach places. If you need to reach something above you, use a strong step stool that has a grab bar. Keep electrical cords out of the way. Do not use floor polish or wax that makes floors slippery. If you must use wax, use non-skid floor wax. Do not have throw rugs and other things on the floor that can make you trip. What can I do with my stairs? Do not leave any items on the stairs. Make sure that there are handrails on both sides of the stairs and use them. Fix handrails that are broken or loose. Make sure that handrails are as long as the stairways. Check any carpeting to make sure that it is firmly attached to the stairs. Fix any carpet that is loose or worn. Avoid  having throw rugs at the top or bottom of the stairs. If you do have throw rugs, attach them to the floor with carpet tape. Make sure that you have a light switch at the top of the stairs and the bottom of the stairs. If you do not have them, ask someone to add them for you. What else can I do to help prevent falls? Wear shoes that: Do not have high heels. Have rubber bottoms. Are comfortable and fit you well. Are closed at the toe. Do not wear sandals. If you use a stepladder: Make sure that it is fully opened. Do not climb a closed stepladder. Make sure that both sides of the stepladder are locked into place. Ask someone to hold it for you, if  possible. Clearly mark and make sure that you can see: Any grab bars or handrails. First and last steps. Where the edge of each step is. Use tools that help you move around (mobility aids) if they are needed. These include: Canes. Walkers. Scooters. Crutches. Turn on the lights when you go into a dark area. Replace any light bulbs as soon as they burn out. Set up your furniture so you have a clear path. Avoid moving your furniture around. If any of your floors are uneven, fix them. If there are any pets around you, be aware of where they are. Review your medicines with your doctor. Some medicines can make you feel dizzy. This can increase your chance of falling. Ask your doctor what other things that you can do to help prevent falls. This information is not intended to replace advice given to you by your health care provider. Make sure you discuss any questions you have with your health care provider. Document Released: 05/28/2009 Document Revised: 01/07/2016 Document Reviewed: 09/05/2014 Elsevier Interactive Patient Education  2017 [employer].

## 2021-06-28 NOTE — Therapy (Signed)
[facility] @ [address] [place], [place], [address] Phone: [phone]   Fax:  [phone]  Physical Therapy Treatment  Patient Details  Name: Stanley Bennett MRN: [id number] Date of Birth: 22-Mar-1956 Referring Provider (PT): Hulan Saas, MD   Encounter Date: 06/28/2021   PT End of Session - 06/28/21 1100     Visit Number 6    Date for PT Re-Evaluation 08/04/21    Authorization Type [employer]    Progress Note Due on Visit 10    PT Start Time 1014    PT Stop Time 1055    PT Time Calculation (min) 41 min    Activity Tolerance Patient tolerated treatment well    Behavior During Therapy [facility] for tasks assessed/performed             Past Medical History:  Diagnosis Date   Allergy    seasonal allergies   Arthritis    generalized   Basal cell carcinoma    scalp   GERD (gastroesophageal reflux disease)    on meds   Gout    on gout   Hyperlipidemia    on meds    Past Surgical History:  Procedure Laterality Date   APPENDECTOMY     BASAL CELL CARCINOMA EXCISION     CERVICAL DISC SURGERY     COLONOSCOPY  05/2017   MAC-suprep(exc)-polyps   TONSILLECTOMY     UPPER GASTROINTESTINAL ENDOSCOPY  05/2017   normal   VASECTOMY     WISDOM TOOTH EXTRACTION      There were no vitals filed for this visit.   Subjective Assessment - 06/28/21 1015     Subjective My neck is looser. I still get pain on the Lt>Rt upper traps. Inconsistent pain so not able to pin it to a particular motion.    Pertinent History ACDF C5-6 in 2010    Currently in Pain? No/denies    Pain Score --   3-4/10   Pain Location Neck    Pain Orientation Upper;Medial    Pain Descriptors / Indicators Aching    Pain Type Chronic pain    Pain Onset More than a month ago    Pain Frequency Intermittent    Aggravating Factors  No pattern, when i am more active    Pain Relieving Factors strething, Tylenol/Aleve                                [facility] Adult PT Treatment/Exercise - 06/28/21 0001       Neck Exercises: Machines for Strengthening   UBE (Upper Arm Bike) Seat 11, L3 x 6 min (3/3)   PT present to discuss progress     Neck Exercises: Standing   Other Standing Exercises thoracic stretch at counter top: neutral and lateral      Shoulder Exercises: Seated   External Rotation Strengthening;Theraband;20 reps    Theraband Level (Shoulder External Rotation) Level 2 (Red)      Shoulder Exercises: Sidelying   External Rotation Strengthening;Both;20 reps      Manual Therapy   Manual Therapy Soft tissue mobilization;Myofascial release    Manual therapy comments skilled paplation and monitoring with DN today    Soft tissue mobilization bil upper traps, cervical paraspinals and suboccipitals              Trigger Point Dry Needling - 06/28/21 0001     Consent Given? Yes  Education Handout Provided Previously provided    Muscles Treated Head and Neck Oblique capitus;Upper trapezius;Levator scapulae;Cervical multifidi    Upper Trapezius Response Twitch reponse elicited;Palpable increased muscle length    Oblique Capitus Response Twitch response elicited;Palpable increased muscle length    Levator Scapulae Response Twitch response elicited;Palpable increased muscle length    Cervical multifidi Response Twitch reponse elicited;Palpable increased muscle length                     PT Short Term Goals - 06/09/21 1059       PT SHORT TERM GOAL #1   Title independent with initial HEP    Period Weeks    Status New    Target Date 07/07/21      PT SHORT TERM GOAL #2   Title report a 30% reduction in neck pain with daily tasks    Time 4    Period Weeks    Status New    Target Date 07/07/21      PT SHORT TERM GOAL #3   Title report frequent postural corrections to improve alignment with sitting and standing    Time 4    Period Weeks    Status New    Target Date  07/07/21               PT Long Term Goals - 06/28/21 1049       PT LONG TERM GOAL #3   Title report > or = to 60% reduction in the frequency and intensity of headaches and neck pain with daily tasks    Baseline better but not able to report the improvement      PT LONG TERM GOAL #4   Title demonstrate neutral posture with scapular retraction > or = to 75% of the time to reduce UE radiculopathy    Baseline working on posture, feels tight in shoulder blades    Status On-going                   Plan - 06/28/21 1059     Clinical Impression Statement Patient arrived with min complaints at a pain level of 3-4/10.  He had no issues with the addition of shoulder stabilization exercises.  He is working out consistently at [employer].  Pt with Lt upper trap aggravation with daily tasks and interscapular tension with upright posture.  Pt with trigger points in Lt>Rt neck and had good twitch response with needling. PT educated regarding countertop flexibility to improve thoracic mobility.  He understands that his numbness will not likely change but he is hoping to avoid further surgery. He would benefit from continued skilled PT to address postural weakness.    PT Frequency 2x / week    PT Duration 8 weeks    PT Treatment/Interventions ADLs/Self Care Home Management;Cryotherapy;Electrical Stimulation;Moist Heat;Traction;Neuromuscular re-education;Therapeutic exercise;Therapeutic activities;Patient/family education;Manual techniques;Passive range of motion;Dry needling;Joint Manipulations;Spinal Manipulations;Taping    PT Next Visit Plan add  thoracic mobility to HEP.  Pt will be on trip x 2 weeks after next session.  Continue to focus on postural stab,flexibility and tissue mobility,    PT Home Exercise Plan Access Code: [facility]    Recommended Other Services initial cert is signed    Consulted and Agree with Plan of Care Patient             Patient will benefit from skilled  therapeutic intervention in order to improve the following deficits and impairments:  Decreased strength, Postural dysfunction, Impaired  flexibility, Pain, Increased muscle spasms, Decreased range of motion  Visit Diagnosis: Cervicalgia  Abnormal posture  Cramp and spasm     Problem List Patient Active Problem List   Diagnosis Date Noted   Trigger point of right shoulder region 05/13/2021   DDD (degenerative disc disease), cervical 04/11/2021   Nonobstructive atherosclerosis of coronary artery 01/19/2020   Pure hypercholesterolemia 01/19/2020   Family history of heart disease 01/19/2020   Chronic right-sided low back pain with right-sided sciatica 02/12/2019   Gout 08/07/2016   H/O seasonal allergies 08/07/2016   Hyperlipidemia 08/07/2016   Chronic pain of both knees 08/07/2016   Erectile dysfunction 08/05/2016   Gastroesophageal reflux disease 08/05/2016   Sigurd Sos, PT 06/28/21 11:02 AM   [facility] @ [place] [place], [place], [address] Phone: [phone]   Fax:  [phone]  Name: Stanley Bennett MRN: [id number] Date of Birth: 04-19-1956

## 2021-06-28 NOTE — Progress Notes (Signed)
I connected with Stanley Bennett today by telephone and verified that I am speaking with the correct person using two identifiers. Location patient: home Location provider: work Persons participating in the virtual visit: Stanley Bennett, Glenna Durand LPN.   I discussed the limitations, risks, security and privacy concerns of performing an evaluation and management service by telephone and the availability of in person appointments. I also discussed with the patient that there may be a patient responsible charge related to this service. The patient expressed understanding and verbally consented to this telephonic visit.    Interactive audio and video telecommunications were attempted between this provider and patient, however failed, due to patient having technical difficulties OR patient did not have access to video capability.  We continued and completed visit with audio only.     Vital signs may be patient reported or missing.  Subjective:   Stanley Bennett is a 65 y.o. male who presents for Medicare Annual/Subsequent preventive examination.  Review of Systems     Cardiac Risk Factors include: advanced age (>64mn, >>20women);dyslipidemia;male gender     Objective:    Today's Vitals   06/28/21 1341 06/28/21 1342  Weight: 162 lb (73.5 kg)   Height: 5' 9.5" (1.765 m)   PainSc:  2    Body mass index is 23.58 kg/m.  Advanced Directives 06/28/2021 06/09/2021 06/17/2020 02/13/2019 05/15/2017  Does Patient Have a Medical Advance Directive? _0   Type of AParamedicof A[place]amiaLiving will [place]annonLiving will [place]borough[place] will Living will [place] will  Does patient want to make changes to medical advance directive? - No - Patient declined - No - Patient declined -  Copy of H[place]keyin Chart? No - copy requested - No - copy requested - No - copy  requested    Current Medications (verified) Outpatient Encounter Medications as of 06/28/2021  Medication Sig   allopurinol (ZYLOPRIM) 300 MG tablet TAKE 1 TABLET(300 MG) BY MOUTH DAILY AS DIRECTED   aspirin EC 81 MG tablet Take 1 tablet (81 mg total) by mouth daily.   EPINEPHrine 0.3 mg/0.3 mL IJ SOAJ injection SMARTSIG:Injection As Directed   fluticasone (FLONASE) 50 MCG/ACT nasal spray Place 1 spray into both nostrils 2 (two) times daily.    loratadine (CLARITIN) 10 MG tablet Take 10 mg by mouth daily.   pantoprazole (PROTONIX) 20 MG tablet TAKE 1 TABLET(20 MG) BY MOUTH DAILY BEFORE AND BREAKFAST   PRESCRIPTION MEDICATION Allergy shots every other week   rosuvastatin (CRESTOR) 20 MG tablet TAKE 1 TABLET(20 MG) BY MOUTH DAILY   tadalafil (CIALIS) 5 MG tablet Take 1 tablet (5 mg total) by mouth daily.   Vitamin D, Ergocalciferol, (DRISDOL) 1.25 MG (50000 UNIT) CAPS capsule Take 1 capsule (50,000 Units total) by mouth every 7 (seven) days.   gabapentin (NEURONTIN) 100 MG capsule Take 2 capsules (200 mg total) by mouth at bedtime.   nitroGLYCERIN (NITROSTAT) 0.4 MG SL tablet Place 1 tablet (0.4 mg total) under the tongue every 5 (five) minutes as needed for chest pain. Do not use within 48 ours of using cialis.   No facility-administered encounter medications on file as of 06/28/2021.    Allergies (verified) Teriflunomide and Tamiflu [oseltamivir]   History: Past Medical History:  Diagnosis Date   Allergy    seasonal allergies   Arthritis    generalized   Basal cell carcinoma    scalp   GERD (  gastroesophageal reflux disease)    on meds   Gout    on gout   Hyperlipidemia    on meds   Past Surgical History:  Procedure Laterality Date   APPENDECTOMY     BASAL CELL CARCINOMA EXCISION     CERVICAL DISC SURGERY     COLONOSCOPY  05/2017   MAC-suprep(exc)-polyps   TONSILLECTOMY     UPPER GASTROINTESTINAL ENDOSCOPY  05/2017   normal   VASECTOMY     WISDOM TOOTH EXTRACTION      Family History  Problem Relation Age of Onset   Arthritis Mother        osteo   Arthritis Father        osteo   Heart disease Father        (pneumonia cause of death 23 yrs)   Prostate cancer Father    Diabetes Sister    Ovarian cancer Sister    Other Brother        celiac sprue   Prostate cancer Brother 64   Kidney failure Cousin        pat cousin   Kidney failure Cousin        pat cousin   Colon cancer Neg Hx    Esophageal cancer Neg Hx    Rectal cancer Neg Hx    Colon polyps Neg Hx    Stomach cancer Neg Hx    Social History   Socioeconomic History   Marital status: Married    Spouse name: [name]   Number of children: 3   Years of education: Not on file   Highest education level: Not on file  Occupational History   Occupation: Development worker, international aid    Comment: retired  Tobacco Use   Smoking status: Never   Smokeless tobacco: Never  Vaping Use   Vaping Use: Never used  Substance and Sexual Activity   Alcohol use: Yes    Alcohol/week: 5.0 standard drinks    Types: 5 Standard drinks or equivalent per week    Comment: 5   Drug use: No   Sexual activity: Yes    Partners: Female  Other Topics Concern   Not on file  Social History Narrative   Work or School: [employer], 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      Home Situation: lives with wife      Spiritual Beliefs: Catholic      Lifestyle: regular exercise and healthy diet   Social Determinants of Radio broadcast assistant Strain: Low Risk    Difficulty of Paying Living Expenses: Not hard at all  Food Insecurity: No Food Insecurity   Worried About Charity fundraiser in the Last Year: Never true   Arboriculturist in the Last Year: Never true  Transportation Needs: No Transportation Needs   Lack of Transportation (Medical): No   Lack of Transportation (Non-Medical): No  Physical Activity: Sufficiently Active   Days of Exercise per Week: 6 days   Minutes of Exercise per Session: 90 min   Stress: No Stress Concern Present   Feeling of Stress : Not at all  Social Connections: Not on file    Tobacco Counseling Counseling given: Not Answered   Clinical Intake:  Pre-visit preparation completed: Yes  Pain : 0-10 Pain Score: 2  Pain Type: Chronic pain Pain Location: Back Pain Orientation: Lower Pain Onset: More than a month ago Pain Frequency: Constant     Nutritional Status: BMI of 19-24  Normal Nutritional Risks: None  Diabetes: No  How often do you need to have someone help you when you read instructions, pamphlets, or other written materials from your doctor or pharmacy?: 1 - Never What is the last grade level you completed in school?: 1 yr graduate school  Diabetic? no  Interpreter Needed?: No  Information entered by :: NAllen LPN   Activities of Daily Living In your present state of health, do you have any difficulty performing the following activities: 06/28/2021  Hearing? Y  Vision? N  Difficulty concentrating or making decisions? N  Walking or climbing stairs? N  Dressing or bathing? N  Doing errands, shopping? N  Preparing Food and eating ? N  Using the Toilet? N  In the past six months, have you accidently leaked urine? N  Do you have problems with loss of bowel control? N  Managing your Medications? N  Managing your Finances? N  Housekeeping or managing your Housekeeping? N  Some recent data might be hidden    Patient Care Team: Caren Macadam, MD as PCP - General (Family Medicine) Buford Dresser, MD as PCP - Cardiology ([facility] [place] any recent Medical Services you may have received from other than [facility] providers in the past year (date may be approximate).     Assessment:   This is a routine wellness examination for Stanley Bennett.  Hearing/Vision screen Vision Screening - Comments:: Regular eye exams, [facility]  Dietary issues and exercise activities discussed: Current Exercise  Habits: Home exercise routine, Type of exercise: walking, Time (Minutes): > 60, Frequency (Times/Week): 6, Weekly Exercise (Minutes/Week): 0   Goals Addressed             This Visit's Progress    Patient Stated       06/28/2021, remain active       Depression Screen PHQ 2/9 Scores 06/28/2021 03/01/2021 06/17/2020 02/19/2020 12/11/2017  PHQ - 2 Score 0 0 0 0 0  PHQ- 9 Score - 1 - - -    Fall Risk Fall Risk  06/28/2021 03/01/2021 06/17/2020 02/19/2020  Falls in the past year? 0 0 0 0  Number falls in past yr: - 0 0 0  Injury with Fall? - - 0 0  Risk for fall due to : Medication side effect - Impaired vision;Impaired balance/gait -  Risk for fall due to: Comment - - vertigo at times -  Follow up Falls evaluation completed;Education provided;Falls prevention discussed - Falls prevention discussed -    FALL RISK PREVENTION PERTAINING TO THE HOME:  Any stairs in or around the home? Yes  If so, are there any without handrails? No  Home free of loose throw rugs in walkways, pet beds, electrical cords, etc? Yes  Adequate lighting in your home to reduce risk of falls? Yes   ASSISTIVE DEVICES UTILIZED TO PREVENT FALLS:  Life alert? No  Use of a cane, walker or w/c? No  Grab bars in the bathroom? No  Shower chair or bench in shower? No  Elevated toilet seat or a handicapped toilet? Yes   TIMED UP AND GO:  Was the test performed? No .      Cognitive Function:     6CIT Screen 06/28/2021 06/17/2020  What Year? 0 points 0 points  What month? 0 points 0 points  What time? 0 points -  Count back from 20 0 points 0 points  Months in reverse 0 points 0 points  Repeat phrase 0 points 0 points  Total Score 0 -  Immunizations Immunization History  Administered Date(s) Administered   Influenza-Unspecified 05/27/2010, 05/14/2013, 05/26/2015, 05/15/2017, 06/03/2017, 05/17/2018, 05/19/2019, 05/05/2020   PFIZER(Purple Top)SARS-COV-2 Vaccination 09/04/2019, 09/22/2019, 11/20/2019,  12/07/2020   Pfizer Covid-19 Vaccine Bivalent Booster 8yr & up 05/17/2021   Pneumococcal Polysaccharide-23 02/19/2020   Tdap 06/29/2006, 11/22/2016   Typhoid Live 11/22/2016   Yellow Fever 11/22/2016   Zoster Recombinat (Shingrix) 03/24/2020, 05/24/2020   Zoster, Live 06/23/2015    TDAP status: Up to date  Flu Vaccine status: Up to date  Pneumococcal vaccine status: Up to date  Covid-19 vaccine status: Completed vaccines  Qualifies for Shingles Vaccine? Yes   Zostavax completed Yes   Shingrix Completed?: Yes  Screening Tests Health Maintenance  Topic Date Due   Pneumonia Vaccine 67 Years old (2 - PCV) 02/18/2021   COLONOSCOPY (Pts 45-486yrInsurance coverage will need to be confirmed)  08/25/2025   TETANUS/TDAP  11/23/2026   INFLUENZA VACCINE  Completed   COVID-19 Vaccine  Completed   Hepatitis C Screening  Completed   Zoster Vaccines- Shingrix  Completed   HPV VACCINES  Aged Out    Health Maintenance  Health Maintenance Due  Topic Date Due   Pneumonia Vaccine [age]Years old (2 - PCV) 02/18/2021    Colorectal cancer screening: Type of screening: Colonoscopy. Completed 08/25/2020. Repeat every 5 years  Lung Cancer Screening: (Low Dose CT Chest recommended if Age 67-80ears, 30 pack-year currently smoking OR have quit w/in 15years.) does not qualify.   Lung Cancer Screening Referral: no  Additional Screening:  Hepatitis C Screening: does qualify; Completed 12/06/2016  Vision Screening: Recommended annual ophthalmology exams for early detection of glaucoma and other disorders of the eye. Is the patient up to date with their annual eye exam?  Yes  Who is the provider or what is the name of the office in which the patient attends annual eye exams? [facility] pt is not established with a provider, would they like to be referred to a provider to establish care? No .   Dental Screening: Recommended annual dental exams for proper oral hygiene  Community  Resource Referral / Chronic Care Management: CRR required this visit?  No   CCM required this visit?  No      Plan:     I have personally reviewed and noted the following in the patient's chart:   Medical and social history Use of alcohol, tobacco or illicit drugs  Current medications and supplements including opioid prescriptions. Patient is not currently taking opioid prescriptions. Functional ability and status Nutritional status Physical activity Advanced directives List of other physicians Hospitalizations, surgeries, and ER visits in previous 12 months Vitals Screenings to include cognitive, depression, and falls Referrals and appointments  In addition, I have reviewed and discussed with patient certain preventive protocols, quality metrics, and best practice recommendations. A written personalized care plan for preventive services as well as general preventive health recommendations were provided to patient.     NiKellie SimmeringLPN   [id number] Nurse Notes: none

## 2021-06-30 ENCOUNTER — Ambulatory Visit: Payer: PPO

## 2021-06-30 ENCOUNTER — Other Ambulatory Visit: Payer: Self-pay

## 2021-06-30 DIAGNOSIS — M542 Cervicalgia: Secondary | ICD-10-CM | POA: Diagnosis not present

## 2021-06-30 DIAGNOSIS — J3089 Other allergic rhinitis: Secondary | ICD-10-CM | POA: Diagnosis not present

## 2021-06-30 DIAGNOSIS — J3081 Allergic rhinitis due to animal (cat) (dog) hair and dander: Secondary | ICD-10-CM | POA: Diagnosis not present

## 2021-06-30 DIAGNOSIS — J301 Allergic rhinitis due to pollen: Secondary | ICD-10-CM | POA: Diagnosis not present

## 2021-06-30 DIAGNOSIS — R293 Abnormal posture: Secondary | ICD-10-CM

## 2021-06-30 DIAGNOSIS — R252 Cramp and spasm: Secondary | ICD-10-CM

## 2021-06-30 NOTE — Patient Instructions (Signed)
Continue current HEP and gym routine.

## 2021-06-30 NOTE — Therapy (Signed)
[facility] @ [place], [place], [address] Phone: [phone]   Fax:  [phone]  Physical Therapy Treatment  Patient Details  Name: Stanley Bennett MRN: [id number] Date of Birth: 1956-02-22 Referring Provider (PT): Hulan Saas, MD   Encounter Date: 06/30/2021   PT End of Session - 06/30/21 1113     Visit Number 7    Date for PT Re-Evaluation 08/04/21    Authorization Type [employer]    Progress Note Due on Visit 10    PT Start Time 6979    PT Stop Time 1144    PT Time Calculation (min) 42 min    Activity Tolerance Patient tolerated treatment well    Behavior During Therapy [facility] for tasks assessed/performed             Past Medical History:  Diagnosis Date   Allergy    seasonal allergies   Arthritis    generalized   Basal cell carcinoma    scalp   GERD (gastroesophageal reflux disease)    on meds   Gout    on gout   Hyperlipidemia    on meds    Past Surgical History:  Procedure Laterality Date   APPENDECTOMY     BASAL CELL CARCINOMA EXCISION     CERVICAL DISC SURGERY     COLONOSCOPY  05/2017   MAC-suprep(exc)-polyps   TONSILLECTOMY     UPPER GASTROINTESTINAL ENDOSCOPY  05/2017   normal   VASECTOMY     WISDOM TOOTH EXTRACTION      There were no vitals filed for this visit.   Subjective Assessment - 06/30/21 1104     Subjective Patient states "its about the same".  No pain to report.    Pertinent History ACDF C5-6 in 2010    Limitations Sitting    How long can you sit comfortably? sitting limited to 30 minutes- hamstring and low back limitations    Diagnostic tests MRI: progression to mild spinal canal stenosis C3-4    Patient Stated Goals stay active, figure out what is going on, reduce pain    Currently in Pain? No/denies                               [facility] Adult PT Treatment/Exercise - 06/30/21 0001       Neck Exercises: Machines for  Strengthening   UBE (Upper Arm Bike) Seat 11, L3 x 6 min (3/3)   PT present to discuss progress     Neck Exercises: Theraband   Shoulder Extension 20 reps;Green    Rows Green;20 reps    Shoulder External Rotation 20 reps;Green    Horizontal ABduction 20 reps;Green      Neck Exercises: Standing   Other Standing Exercises thoracic stretch at counter top: neutral and lateral      Neck Exercises: Stretches   Other Neck Stretches Doorway stretch for throracic stretch    Other Neck Stretches open book stretch for thoracic rotation      Shoulder Exercises: Supine   Protraction Strengthening;Both;20 reps    Protraction Weight (lbs) 4 lb      Shoulder Exercises: Seated   External Rotation Strengthening;Theraband;20 reps    Theraband Level (Shoulder External Rotation) Level 2 (Red)      Shoulder Exercises: Prone   Horizontal ABduction 1 Strengthening;Both;20 reps   4lb   Other Prone Exercises Extension both, 4 lb x20  Other Prone Exercises Row both 4 lb x 20      Shoulder Exercises: Sidelying   External Rotation Strengthening;Both;20 reps      Manual Therapy   Manual Therapy Soft tissue mobilization;Myofascial release    Soft tissue mobilization bil upper traps, cervical paraspinals and suboccipitals                     PT Education - 06/30/21 1108     Education Details Discussed appropriate time to be concerned about regression i.e. he begins to notice grip strength loss or dexterity and control with playing his guitar.  Added open book stretch and doorway stretch for thoracic rotation and stretching.              PT Short Term Goals - 06/09/21 1059       PT SHORT TERM GOAL #1   Title independent with initial HEP    Period Weeks    Status New    Target Date 07/07/21      PT SHORT TERM GOAL #2   Title report a 30% reduction in neck pain with daily tasks    Time 4    Period Weeks    Status New    Target Date 07/07/21      PT SHORT TERM GOAL #3    Title report frequent postural corrections to improve alignment with sitting and standing    Time 4    Period Weeks    Status New    Target Date 07/07/21               PT Long Term Goals - 06/28/21 1049       PT LONG TERM GOAL #3   Title report > or = to 60% reduction in the frequency and intensity of headaches and neck pain with daily tasks    Baseline better but not able to report the improvement      PT LONG TERM GOAL #4   Title demonstrate neutral posture with scapular retraction > or = to 75% of the time to reduce UE radiculopathy    Baseline working on posture, feels tight in shoulder blades    Status On-going                   Plan - 06/30/21 1113     Clinical Impression Statement Patient with min complaints of pain but no significant changes.  He is compliant and well motivated with managing his symptoms.  There has been no decline in motor symptoms.  He performs all exercises correctly with minimal verbal cues.  He would benefit from continued skilled PT to address postural weakness and myofascial restriction.    Personal Factors and Comorbidities Comorbidity 1    Comorbidities cervical fusion    Examination-Activity Limitations Carry;Lift    Stability/Clinical Decision Making Stable/Uncomplicated    Clinical Decision Making Low    Rehab Potential Good    PT Frequency 2x / week    PT Duration 8 weeks    PT Treatment/Interventions ADLs/Self Care Home Management;Cryotherapy;Electrical Stimulation;Moist Heat;Traction;Neuromuscular re-education;Therapeutic exercise;Therapeutic activities;Patient/family education;Manual techniques;Passive range of motion;Dry needling;Joint Manipulations;Spinal Manipulations;Taping    PT Next Visit Plan Pt will be on trip x 2 weeks..  Continue to focus on postural stab,flexibility and tissue mobility,    PT Home Exercise Plan Access Code: [email or web address]    Consulted and Agree with Plan of Care Patient             Patient will  benefit from skilled therapeutic intervention in order to improve the following deficits and impairments:  Decreased strength, Postural dysfunction, Impaired flexibility, Pain, Increased muscle spasms, Decreased range of motion  Visit Diagnosis: Cervicalgia  Abnormal posture  Cramp and spasm     Problem List Patient Active Problem List   Diagnosis Date Noted   Trigger point of right shoulder region 05/13/2021   DDD (degenerative disc disease), cervical 04/11/2021   Nonobstructive atherosclerosis of coronary artery 01/19/2020   Pure hypercholesterolemia 01/19/2020   Family history of heart disease 01/19/2020   Chronic right-sided low back pain with right-sided sciatica 02/12/2019   Gout 08/07/2016   H/O seasonal allergies 08/07/2016   Hyperlipidemia 08/07/2016   Chronic pain of both knees 08/07/2016   Erectile dysfunction 08/05/2016   Gastroesophageal reflux disease 08/05/2016    Anderson Malta B. Jelani Vreeland, PT 06/30/2210:45 AM   [place] @ [place], [place], [address] Phone: [phone]   Fax:  [phone]  Name: Stanley Bennett MRN: [id number] Date of Birth: 08-25-1955

## 2021-07-15 DIAGNOSIS — J3089 Other allergic rhinitis: Secondary | ICD-10-CM | POA: Diagnosis not present

## 2021-07-15 DIAGNOSIS — J3081 Allergic rhinitis due to animal (cat) (dog) hair and dander: Secondary | ICD-10-CM | POA: Diagnosis not present

## 2021-07-15 DIAGNOSIS — J301 Allergic rhinitis due to pollen: Secondary | ICD-10-CM | POA: Diagnosis not present

## 2021-07-19 ENCOUNTER — Other Ambulatory Visit: Payer: Self-pay

## 2021-07-19 ENCOUNTER — Ambulatory Visit: Payer: PPO | Attending: Family Medicine

## 2021-07-19 DIAGNOSIS — M542 Cervicalgia: Secondary | ICD-10-CM | POA: Insufficient documentation

## 2021-07-19 DIAGNOSIS — R252 Cramp and spasm: Secondary | ICD-10-CM | POA: Diagnosis not present

## 2021-07-19 DIAGNOSIS — R293 Abnormal posture: Secondary | ICD-10-CM | POA: Diagnosis not present

## 2021-07-19 NOTE — Therapy (Signed)
[facility] @ [place], [place], [address] Phone: [phone]   Fax:  [phone]  Physical Therapy Treatment  Patient Details  Name: Stanley Bennett MRN: [id number] Date of Birth: 03-28-1956 Referring Provider (PT): Hulan Saas, MD   Encounter Date: 07/19/2021   PT End of Session - 07/19/21 1055     Visit Number 8    Date for PT Re-Evaluation 08/04/21    Authorization Type [employer]    Progress Note Due on Visit 10    PT Start Time 8127    PT Stop Time 1055    PT Time Calculation (min) 40 min    Activity Tolerance Patient tolerated treatment well    Behavior During Therapy [facility] for tasks assessed/performed             Past Medical History:  Diagnosis Date   Allergy    seasonal allergies   Arthritis    generalized   Basal cell carcinoma    scalp   GERD (gastroesophageal reflux disease)    on meds   Gout    on gout   Hyperlipidemia    on meds    Past Surgical History:  Procedure Laterality Date   APPENDECTOMY     BASAL CELL CARCINOMA EXCISION     CERVICAL DISC SURGERY     COLONOSCOPY  05/2017   MAC-suprep(exc)-polyps   TONSILLECTOMY     UPPER GASTROINTESTINAL ENDOSCOPY  05/2017   normal   VASECTOMY     WISDOM TOOTH EXTRACTION      There were no vitals filed for this visit.   Subjective Assessment - 07/19/21 1018     Subjective I traveled for 2 weeks.  My neck was very stiff after the plane ride home.    Currently in Pain? No/denies                               [facility] Adult PT Treatment/Exercise - 07/19/21 0001       Neck Exercises: Machines for Strengthening   UBE (Upper Arm Bike) Seat 11, L3 x 6 min (3/3)   PT present to discuss progress     Neck Exercises: Theraband   Shoulder Extension 20 reps;Green    Rows Green;20 reps    Shoulder External Rotation 20 reps;Green      Neck Exercises: Stretches   Other Neck Stretches Doorway  stretch for throracic stretch      Manual Therapy   Manual Therapy Soft tissue mobilization;Myofascial release    Manual therapy comments skilled paplation and monitoring with DN today    Soft tissue mobilization bil upper traps, cervical paraspinals and suboccipitals                       PT Short Term Goals - 07/19/21 1019       PT SHORT TERM GOAL #1   Title independent with initial HEP    Status Achieved      PT SHORT TERM GOAL #2   Title report a 30% reduction in neck pain with daily tasks    Baseline still with pain on the Lt, 50-60% overall reduction since start of care    Status Achieved      PT SHORT TERM GOAL #3   Title report frequent postural corrections to improve alignment with sitting and standing    Status On-going  PT Long Term Goals - 07/19/21 1020       PT LONG TERM GOAL #1   Title The patient will be indep with HEP and understand how to progress himself    Status On-going      PT LONG TERM GOAL #4   Title demonstrate neutral posture with scapular retraction > or = to 75% of the time to reduce UE radiculopathy    Status On-going      PT LONG TERM GOAL #5   Title --                   Plan - 07/19/21 1058     Clinical Impression Statement Pt with lapse in treatment due to travel. Pt reports 50-60% overall improvement in symptoms since the start of care.   He is compliant and well motivated with managing his symptoms including HEP and postural modifications.  He performs all exercises correctly with minimal verbal cues. Pt with good response to DN and manual therapy with twitch response and improved tissue mobility after treatment today.  Pt will benefit from continued skilled PT to address postural weakness and myofascial restriction.    PT Treatment/Interventions ADLs/Self Care Home Management;Cryotherapy;Electrical Stimulation;Moist Heat;Traction;Neuromuscular re-education;Therapeutic exercise;Therapeutic  activities;Patient/family education;Manual techniques;Passive range of motion;Dry needling;Joint Manipulations;Spinal Manipulations;Taping    PT Next Visit Plan manual to address cervical mobility, postural strength    PT Home Exercise Plan Access Code: [email or web address]    Consulted and Agree with Plan of Care Patient             Patient will benefit from skilled therapeutic intervention in order to improve the following deficits and impairments:  Decreased strength, Postural dysfunction, Impaired flexibility, Pain, Increased muscle spasms, Decreased range of motion  Visit Diagnosis: Cervicalgia  Abnormal posture  Cramp and spasm     Problem List Patient Active Problem List   Diagnosis Date Noted   Trigger point of right shoulder region 05/13/2021   DDD (degenerative disc disease), cervical 04/11/2021   Nonobstructive atherosclerosis of coronary artery 01/19/2020   Pure hypercholesterolemia 01/19/2020   Family history of heart disease 01/19/2020   Chronic right-sided low back pain with right-sided sciatica 02/12/2019   Gout 08/07/2016   H/O seasonal allergies 08/07/2016   Hyperlipidemia 08/07/2016   Chronic pain of both knees 08/07/2016   Erectile dysfunction 08/05/2016   Gastroesophageal reflux disease 08/05/2016   Sigurd Sos, PT 07/19/21 10:58 AM   [place] @ [place], [place], [address] Phone: [phone]   Fax:  [phone]  Name: Stanley Bennett MRN: [id number] Date of Birth: 01-08-1956

## 2021-07-21 ENCOUNTER — Ambulatory Visit: Payer: PPO

## 2021-07-21 ENCOUNTER — Other Ambulatory Visit: Payer: Self-pay

## 2021-07-21 DIAGNOSIS — M542 Cervicalgia: Secondary | ICD-10-CM | POA: Diagnosis not present

## 2021-07-21 DIAGNOSIS — R293 Abnormal posture: Secondary | ICD-10-CM

## 2021-07-21 DIAGNOSIS — R252 Cramp and spasm: Secondary | ICD-10-CM

## 2021-07-21 NOTE — Therapy (Signed)
[facility] @ [place], [place], [address] Phone: [phone]   Fax:  [phone]  Physical Therapy Treatment  Patient Details  Name: Stanley Bennett MRN: [id number] Date of Birth: [birth year] Referring Provider (PT): Hulan Saas, MD   Encounter Date: 07/21/2021   PT End of Session - 07/21/21 1019     Visit Number 9    Date for PT Re-Evaluation 08/04/21    Authorization Type [employer]    Progress Note Due on Visit 10    PT Start Time 1771    PT Stop Time 1058    PT Time Calculation (min) 43 min    Activity Tolerance Patient tolerated treatment well    Behavior During Therapy [facility] for tasks assessed/performed             Past Medical History:  Diagnosis Date   Allergy    seasonal allergies   Arthritis    generalized   Basal cell carcinoma    scalp   GERD (gastroesophageal reflux disease)    on meds   Gout    on gout   Hyperlipidemia    on meds    Past Surgical History:  Procedure Laterality Date   APPENDECTOMY     BASAL CELL CARCINOMA EXCISION     CERVICAL DISC SURGERY     COLONOSCOPY  05/2017   MAC-suprep(exc)-polyps   TONSILLECTOMY     UPPER GASTROINTESTINAL ENDOSCOPY  05/2017   normal   VASECTOMY     WISDOM TOOTH EXTRACTION      There were no vitals filed for this visit.   Subjective Assessment - 07/21/21 1011     Subjective Patient states the dry needling was effective at decreasing his stiffness from his trip.  He rates his pain at 3/10 today.  This pain is located at left upper trap.    Pertinent History ACDF C5-6 in 2010    Limitations Sitting    How long can you sit comfortably? sitting limited to 30 minutes- hamstring and low back limitations    Diagnostic tests MRI: progression to mild spinal canal stenosis C3-4    Patient Stated Goals stay active, figure out what is going on, reduce pain    Currently in Pain? Yes    Pain Score 3     Pain Location Neck     Pain Orientation Left    Pain Descriptors / Indicators Aching;Discomfort    Pain Type Chronic pain    Pain Onset More than a month ago                               [facility] Adult PT Treatment/Exercise - 07/21/21 0001       Neck Exercises: Theraband   Shoulder Extension 20 reps;Green    Rows Green;20 reps    Shoulder External Rotation 20 reps;Green    Horizontal ABduction 20 reps;Green      Neck Exercises: Stretches   Other Neck Stretches Doorway stretch for throracic stretch      Shoulder Exercises: Prone   Other Prone Exercises TYI's over physio ball 3 lbs both hands x 10      Shoulder Exercises: Standing   Other Standing Exercises wall lift off for lower trap 2 x 10      Shoulder Exercises: ROM/Strengthening   UBE (Upper Arm Bike) 6 min level 1 3 min fwd, 3 min back  Manual Therapy   Manual Therapy Soft tissue mobilization;Myofascial release;Passive ROM    Soft tissue mobilization bil upper traps, cervical paraspinals and suboccipitals    Passive ROM Cervical rotation and sidebending with upper trap and levator stretch                       PT Short Term Goals - 07/19/21 1019       PT SHORT TERM GOAL #1   Title independent with initial HEP    Status Achieved      PT SHORT TERM GOAL #2   Title report a 30% reduction in neck pain with daily tasks    Baseline still with pain on the Lt, 50-60% overall reduction since start of care    Status Achieved      PT SHORT TERM GOAL #3   Title report frequent postural corrections to improve alignment with sitting and standing    Status On-going               PT Long Term Goals - 07/19/21 1020       PT LONG TERM GOAL #1   Title The patient will be indep with HEP and understand how to progress himself    Status On-going      PT LONG TERM GOAL #4   Title demonstrate neutral posture with scapular retraction > or = to 75% of the time to reduce UE radiculopathy    Status On-going       PT LONG TERM GOAL #5   Title --                   Plan - 07/21/21 1030     Clinical Impression Statement Patient was able to complete TYI's today but anterior shoulder restriction made this fairly difficult.  He was, however, able to complete 10 reps with 3 lb.  He responds well to manual techniques but is also compliant with his postural exercises and stretches.  He is progressing appropriately and should continue to improve.  He would benefit from continued skilled PT for cervical ROM and postural strengthening to allow freedom of movement and reduce muscle tension and restriction in postural musculature.    Personal Factors and Comorbidities Comorbidity 1    Comorbidities cervical fusion    Examination-Activity Limitations Carry;Lift    Stability/Clinical Decision Making Stable/Uncomplicated    Clinical Decision Making Low    Rehab Potential Good    PT Frequency 2x / week    PT Duration 8 weeks    PT Treatment/Interventions ADLs/Self Care Home Management;Cryotherapy;Electrical Stimulation;Moist Heat;Traction;Neuromuscular re-education;Therapeutic exercise;Therapeutic activities;Patient/family education;Manual techniques;Passive range of motion;Dry needling;Joint Manipulations;Spinal Manipulations;Taping    PT Next Visit Plan manual to address cervical mobility, postural strength    PT Home Exercise Plan Access Code: [email or web address]    Consulted and Agree with Plan of Care Patient             Patient will benefit from skilled therapeutic intervention in order to improve the following deficits and impairments:  Decreased strength, Postural dysfunction, Impaired flexibility, Pain, Increased muscle spasms, Decreased range of motion  Visit Diagnosis: Cervicalgia  Abnormal posture  Cramp and spasm     Problem List Patient Active Problem List   Diagnosis Date Noted   Trigger point of right shoulder region 05/13/2021   DDD (degenerative disc disease), cervical  04/11/2021   Nonobstructive atherosclerosis of coronary artery 01/19/2020   Pure hypercholesterolemia 01/19/2020   Family history of heart disease  01/19/2020   Chronic right-sided low back pain with right-sided sciatica 02/12/2019   Gout 08/07/2016   H/O seasonal allergies 08/07/2016   Hyperlipidemia 08/07/2016   Chronic pain of both knees 08/07/2016   Erectile dysfunction 08/05/2016   Gastroesophageal reflux disease 08/05/2016    Anderson Malta B. Kweku Stankey, PT 07/21/2210:02 AM   [facility] @ [place], [place], [address] Phone: [phone]   Fax:  [phone]  Name: Stanley Bennett MRN: [id number] Date of Birth: Jan 01, 1956

## 2021-07-25 ENCOUNTER — Other Ambulatory Visit: Payer: Self-pay

## 2021-07-25 ENCOUNTER — Emergency Department (HOSPITAL_COMMUNITY): Payer: PPO

## 2021-07-25 ENCOUNTER — Emergency Department (HOSPITAL_COMMUNITY)
Admission: EM | Admit: 2021-07-25 | Discharge: 2021-07-25 | Disposition: A | Discharge status: Home / Self Care | Payer: PPO | Attending: Emergency Medicine | Admitting: Emergency Medicine

## 2021-07-25 ENCOUNTER — Encounter (HOSPITAL_COMMUNITY): Payer: Self-pay | Admitting: Emergency Medicine

## 2021-07-25 DIAGNOSIS — F12120 Cannabis abuse with intoxication, uncomplicated: Secondary | ICD-10-CM | POA: Insufficient documentation

## 2021-07-25 DIAGNOSIS — Z85828 Personal history of other malignant neoplasm of skin: Secondary | ICD-10-CM | POA: Diagnosis not present

## 2021-07-25 DIAGNOSIS — Z7982 Long term (current) use of aspirin: Secondary | ICD-10-CM | POA: Diagnosis not present

## 2021-07-25 DIAGNOSIS — R42 Dizziness and giddiness: Secondary | ICD-10-CM | POA: Diagnosis not present

## 2021-07-25 DIAGNOSIS — Z79899 Other long term (current) drug therapy: Secondary | ICD-10-CM | POA: Insufficient documentation

## 2021-07-25 DIAGNOSIS — W01198A Fall on same level from slipping, tripping and stumbling with subsequent striking against other object, initial encounter: Secondary | ICD-10-CM | POA: Insufficient documentation

## 2021-07-25 DIAGNOSIS — Z20822 Contact with and (suspected) exposure to covid-19: Secondary | ICD-10-CM | POA: Diagnosis not present

## 2021-07-25 DIAGNOSIS — J9811 Atelectasis: Secondary | ICD-10-CM | POA: Diagnosis not present

## 2021-07-25 DIAGNOSIS — F12929 Cannabis use, unspecified with intoxication, unspecified: Secondary | ICD-10-CM

## 2021-07-25 DIAGNOSIS — R55 Syncope and collapse: Secondary | ICD-10-CM | POA: Insufficient documentation

## 2021-07-25 DIAGNOSIS — R0902 Hypoxemia: Secondary | ICD-10-CM | POA: Diagnosis not present

## 2021-07-25 DIAGNOSIS — S0990XA Unspecified injury of head, initial encounter: Secondary | ICD-10-CM | POA: Diagnosis not present

## 2021-07-25 DIAGNOSIS — R58 Hemorrhage, not elsewhere classified: Secondary | ICD-10-CM | POA: Diagnosis not present

## 2021-07-25 DIAGNOSIS — Y901 Blood alcohol level of 20-39 mg/100 ml: Secondary | ICD-10-CM | POA: Diagnosis not present

## 2021-07-25 DIAGNOSIS — R4020 Unspecified coma: Secondary | ICD-10-CM | POA: Diagnosis not present

## 2021-07-25 LAB — PROTIME-INR
INR: 1 (ref 0.8–1.2)
Prothrombin Time: 12.9 seconds (ref 11.4–15.2)

## 2021-07-25 LAB — CBC WITH DIFFERENTIAL/PLATELET
Abs Immature Granulocytes: 0.05 10*3/uL (ref 0.00–0.07)
Basophils Absolute: 0.1 10*3/uL (ref 0.0–0.1)
Basophils Relative: 1 %
Eosinophils Absolute: 0.2 10*3/uL (ref 0.0–0.5)
Eosinophils Relative: 2 %
HCT: 41.1 % (ref 39.0–52.0)
Hemoglobin: 12.9 g/dL — ABNORMAL LOW (ref 13.0–17.0)
Immature Granulocytes: 1 %
Lymphocytes Relative: 15 %
Lymphs Abs: 1.5 10*3/uL (ref 0.7–4.0)
MCH: 28.4 pg (ref 26.0–34.0)
MCHC: 31.4 g/dL (ref 30.0–36.0)
MCV: 90.3 fL (ref 80.0–100.0)
Monocytes Absolute: 0.6 10*3/uL (ref 0.1–1.0)
Monocytes Relative: 6 %
Neutro Abs: 7.3 10*3/uL (ref 1.7–7.7)
Neutrophils Relative %: 75 %
Platelets: 280 10*3/uL (ref 150–400)
RBC: 4.55 MIL/uL (ref 4.22–5.81)
RDW: 13.7 % (ref 11.5–15.5)
WBC: 9.6 10*3/uL (ref 4.0–10.5)
nRBC: 0 % (ref 0.0–0.2)

## 2021-07-25 LAB — COMPREHENSIVE METABOLIC PANEL
ALT: 29 U/L (ref 0–44)
AST: 33 U/L (ref 15–41)
Albumin: 3.7 g/dL (ref 3.5–5.0)
Alkaline Phosphatase: 46 U/L (ref 38–126)
Anion gap: 9 (ref 5–15)
BUN: 15 mg/dL (ref 8–23)
CO2: 25 mmol/L (ref 22–32)
Calcium: 8.7 mg/dL — ABNORMAL LOW (ref 8.9–10.3)
Chloride: 104 mmol/L (ref 98–111)
Creatinine, Ser: 0.98 mg/dL (ref 0.61–1.24)
GFR, Estimated: >60 mL/min (ref >60)
Glucose, Bld: 112 mg/dL — ABNORMAL HIGH (ref 70–99)
Potassium: 3.3 mmol/L — ABNORMAL LOW (ref 3.5–5.1)
Sodium: 138 mmol/L (ref 135–145)
Total Bilirubin: 0.6 mg/dL (ref 0.3–1.2)
Total Protein: 6.7 g/dL (ref 6.5–8.1)

## 2021-07-25 LAB — URINALYSIS, ROUTINE W REFLEX MICROSCOPIC
Bilirubin Urine: NEGATIVE
Glucose, UA: NEGATIVE mg/dL
Hgb urine dipstick: NEGATIVE
Ketones, ur: NEGATIVE mg/dL
Leukocytes,Ua: NEGATIVE
Nitrite: NEGATIVE
Protein, ur: NEGATIVE mg/dL
Specific Gravity, Urine: 1.016 (ref 1.005–1.030)
pH: 5 (ref 5.0–8.0)

## 2021-07-25 LAB — TROPONIN I (HIGH SENSITIVITY)
Troponin I (High Sensitivity): 3 ng/L (ref <18)
Troponin I (High Sensitivity): 4 ng/L (ref <18)

## 2021-07-25 LAB — RAPID URINE DRUG SCREEN, HOSP PERFORMED
Amphetamines: NOT DETECTED
Barbiturates: NOT DETECTED
Benzodiazepines: NOT DETECTED
Cocaine: NOT DETECTED
Opiates: NOT DETECTED
Tetrahydrocannabinol: POSITIVE — AB

## 2021-07-25 LAB — MAGNESIUM: Magnesium: 2 mg/dL (ref 1.7–2.4)

## 2021-07-25 LAB — ETHANOL: Alcohol, Ethyl (B): 27 mg/dL — ABNORMAL HIGH (ref <10)

## 2021-07-25 LAB — RESP PANEL BY RT-PCR (FLU A&B, COVID) ARPGX2
Influenza A by PCR: NEGATIVE
Influenza B by PCR: NEGATIVE
SARS Coronavirus 2 by RT PCR: NEGATIVE

## 2021-07-25 LAB — LIPASE, BLOOD: Lipase: 35 U/L (ref 11–51)

## 2021-07-25 LAB — APTT: aPTT: 26 seconds (ref 24–36)

## 2021-07-25 MED ORDER — LACTATED RINGERS IV BOLUS
1000.0000 mL | Freq: Once | INTRAVENOUS | Status: AC
  Administered 2021-07-25: 1000 mL via INTRAVENOUS

## 2021-07-25 NOTE — ED Provider Notes (Signed)
Emergency Medicine Provider Triage Evaluation Note  Stanley Bennett , a 65 y.o. male  was evaluated in triage.  Presents via EMS after syncope.  Pt was at a holiday party.  Drank a large volume of wine and smoked some marijuana.  Had a full syncopal episode.  Did hit his head on the door jam.  When he attempted to get up he vomited profusely and then became pale and diaphoretic.  Per EMS, pt also c/o paresthesias in the stocking/glove distribution of the BUE and BLE.  No hx of CVA.  Does not take a blood thinner.  EMS denies orthostasis on scene. Wife reports shuffling gait that is new since the fall.   Per EMS, they have cleared pt's c-cpine. C-collar placed in triage.  Wife reports history of cervical fusion.  Review of Systems  Positive: Syncope, nausea, vomiting Negative: Headache, neck pain  Physical Exam  BP (!) 149/80 (BP Location: Right Arm)   Pulse 63   Temp 98 F (36.7 C) (Oral)   Resp 17   SpO2 95%   Gen:   Awake, no distress, pale Resp:  Normal effort  MSK:   Moves extremities without difficulty  Other:  Strength 5/5 in BUE and BLE, normal finger to nose bilaterally, PERRL, sensation to soft touch intact in BUE and BLE but reports paresthesias from toes to knees bilaterally and from fingertips to elbows bilaterally.   Medical Decision Making  Medically screening exam initiated at 12:48 AM.  Appropriate orders placed.  EVAN MACKIE was informed that the remainder of the evaluation will be completed by another provider, this initial triage assessment does not replace that evaluation, and the importance of remaining in the ED until their evaluation is complete.  Etoh and THC use, syncope, vomiting and paresthesias.    Jaelee Laughter, Gwenlyn Perking 07/25/21 0108    Fatima Blank, MD 07/25/21 [phone]

## 2021-07-25 NOTE — Discharge Instructions (Signed)
All the labs and imaging look okay today.  Your symptoms should wear off in the next 24 hours.  Make sure you are eating and drinking and resting.  If you start having chest pain, palpitations any further episodes of passing out or inability to use 1 side of your body or speak you should return to the emergency room immediately

## 2021-07-25 NOTE — ED Triage Notes (Signed)
Pt was at family function, ETOH, 2 hits of marijuana (first time user)  Pt got dizzy and hit his head.  Was not orthostatic.  Walked to next room and had episode of vomiting.  Pt stated he felt better after vomiting. Pt appeared pale but VSS.  Decision was made to transport.

## 2021-07-25 NOTE — ED Provider Notes (Signed)
[facility] EMERGENCY DEPARTMENT Provider Note   CSN: [id number] Arrival date & time: 07/25/21  0044     History Chief Complaint  Patient presents with   Stanley Bennett is a 65 y.o. male.  Patient is a 65 year old male with a history of hyperlipidemia and chronic neck pain who is presenting today after a syncopal event.  Patient reports he was at a family function yesterday and he had been drinking a few glasses of wine and then took a few hits of marijuana.  Shortly after that he became very dizzy and did not feel well.  Wife reports that he lost consciousness falling into a door jam and sinking to the floor.  She reports that he was unconscious for approximately 1 minute and then started coming around.  Initially the color had drained out of his face but then when he started waking up he looked better however then he started to become very nauseated vomited once and they tried to get him to walk into the living room and he was staggering all over reporting that he felt dizzy.  They unfortunately have waited in the waiting room for 7 hours prior to being seen and he reports that he still feels a little bit dizzy but overall is feeling better.  He denies significant headache and reports his neck pain is no different than baseline.  He has no unilateral numbness or weakness but did report that at one point his whole body was tingling.  He denies any chest pain, shortness of breath or palpitations.  No prior history of similar events.  He reports this is the first time he has had any marijuana in years.  The history is provided by the patient and the spouse.  Fall This is a new problem.      Past Medical History:  Diagnosis Date   Allergy    seasonal allergies   Arthritis    generalized   Basal cell carcinoma    scalp   GERD (gastroesophageal reflux disease)    on meds   Gout    on gout   Hyperlipidemia    on meds    Patient Active Problem List    Diagnosis Date Noted   Trigger point of right shoulder region 05/13/2021   DDD (degenerative disc disease), cervical 04/11/2021   Nonobstructive atherosclerosis of coronary artery 01/19/2020   Pure hypercholesterolemia 01/19/2020   Family history of heart disease 01/19/2020   Chronic right-sided low back pain with right-sided sciatica 02/12/2019   Gout 08/07/2016   H/O seasonal allergies 08/07/2016   Hyperlipidemia 08/07/2016   Chronic pain of both knees 08/07/2016   Erectile dysfunction 08/05/2016   Gastroesophageal reflux disease 08/05/2016    Past Surgical History:  Procedure Laterality Date   APPENDECTOMY     BASAL CELL CARCINOMA EXCISION     CERVICAL DISC SURGERY     COLONOSCOPY  05/2017   MAC-suprep(exc)-polyps   TONSILLECTOMY     UPPER GASTROINTESTINAL ENDOSCOPY  05/2017   normal   VASECTOMY     WISDOM TOOTH EXTRACTION         Family History  Problem Relation Age of Onset   Arthritis Mother        osteo   Arthritis Father        osteo   Heart disease Father        (pneumonia cause of death 21 yrs)   Prostate cancer Father    Diabetes Sister  Ovarian cancer Sister    Other Brother        celiac sprue   Prostate cancer Brother 32   Kidney failure Cousin        pat cousin   Kidney failure Cousin        pat cousin   Colon cancer Neg Hx    Esophageal cancer Neg Hx    Rectal cancer Neg Hx    Colon polyps Neg Hx    Stomach cancer Neg Hx     Social History   Tobacco Use   Smoking status: Never   Smokeless tobacco: Never  Vaping Use   Vaping Use: Never used  Substance Use Topics   Alcohol use: Yes    Alcohol/week: 5.0 standard drinks    Types: 5 Standard drinks or equivalent per week    Comment: 5   Drug use: No    Home Medications Prior to Admission medications   Medication Sig Start Date End Date Taking? Authorizing Provider  allopurinol (ZYLOPRIM) 300 MG tablet TAKE 1 TABLET(300 MG) BY MOUTH DAILY AS DIRECTED 06/24/21   Caren Macadam,  MD  aspirin EC 81 MG tablet Take 1 tablet (81 mg total) by mouth daily. 12/11/19   Buford Dresser, MD  EPINEPHrine 0.3 mg/0.3 mL IJ SOAJ injection SMARTSIG:Injection As Directed 03/24/20   [provider]  fluticasone (FLONASE) 50 MCG/ACT nasal spray Place 1 spray into both nostrils 2 (two) times daily.  07/28/16   [provider]  gabapentin (NEURONTIN) 100 MG capsule Take 2 capsules (200 mg total) by mouth at bedtime. 04/08/21   Lyndal Pulley, DO  loratadine (CLARITIN) 10 MG tablet Take 10 mg by mouth daily.    [provider]  nitroGLYCERIN (NITROSTAT) 0.4 MG SL tablet Place 1 tablet (0.4 mg total) under the tongue every 5 (five) minutes as needed for chest pain. Do not use within 48 ours of using cialis. 12/11/19 03/10/20  Buford Dresser, MD  pantoprazole (PROTONIX) 20 MG tablet TAKE 1 TABLET(20 MG) BY MOUTH DAILY BEFORE AND BREAKFAST 06/24/21   Caren Macadam, MD  PRESCRIPTION MEDICATION Allergy shots every other week    [provider]  rosuvastatin (CRESTOR) 20 MG tablet TAKE 1 TABLET(20 MG) BY MOUTH DAILY 03/01/21   Koberlein, Steele Berg, MD  tadalafil (CIALIS) 5 MG tablet Take 1 tablet (5 mg total) by mouth daily. 03/01/21   Caren Macadam, MD  Vitamin D, Ergocalciferol, (DRISDOL) 1.25 MG (50000 UNIT) CAPS capsule Take 1 capsule (50,000 Units total) by mouth every 7 (seven) days. 04/08/21   Lyndal Pulley, DO    Allergies    Teriflunomide and Tamiflu [oseltamivir]  Review of Systems   Review of Systems  All other systems reviewed and are negative.  Physical Exam Updated Vital Signs BP 135/85   Pulse (!) 57   Temp 97.8 F (36.6 C) (Oral)   Resp 14   Ht 5' 9.5" (1.765 m)   Wt 73.5 kg   SpO2 99%   BMI 23.58 kg/m   Physical Exam Vitals and nursing note reviewed.  Constitutional:      General: He is not in acute distress.    Appearance: Normal appearance. He is well-developed and normal weight.  HENT:     Head:  Normocephalic and atraumatic.     Mouth/Throat:     Mouth: Mucous membranes are dry.  Eyes:     Conjunctiva/sclera: Conjunctivae normal.     Pupils: Pupils are equal, round, and reactive  to light.  Cardiovascular:     Rate and Rhythm: Normal rate and regular rhythm.     Heart sounds: No murmur heard. Pulmonary:     Effort: Pulmonary effort is normal. No respiratory distress.     Breath sounds: Normal breath sounds. No wheezing or rales.  Abdominal:     General: There is no distension.     Palpations: Abdomen is soft.     Tenderness: There is no abdominal tenderness. There is no guarding or rebound.  Musculoskeletal:        General: No tenderness. Normal range of motion.     Cervical back: Normal range of motion and neck supple. No tenderness.  Skin:    General: Skin is warm and dry.     Findings: No erythema or rash.  Neurological:     Mental Status: He is alert and oriented to person, place, and time. Mental status is at baseline.     Sensory: No sensory deficit.     Motor: No weakness.     Gait: Gait normal.  Psychiatric:        Mood and Affect: Mood normal.        Behavior: Behavior normal.    ED Results / Procedures / Treatments   Labs (all labs ordered are listed, but only abnormal results are displayed) Labs Reviewed  CBC WITH DIFFERENTIAL/PLATELET - Abnormal; Notable for the following components:      Result Value   Hemoglobin 12.9 (*)    All other components within normal limits  COMPREHENSIVE METABOLIC PANEL - Abnormal; Notable for the following components:   Potassium 3.3 (*)    Glucose, Bld 112 (*)    Calcium 8.7 (*)    All other components within normal limits  ETHANOL - Abnormal; Notable for the following components:   Alcohol, Ethyl (B) 27 (*)    All other components within normal limits  RAPID URINE DRUG SCREEN, HOSP PERFORMED - Abnormal; Notable for the following components:   Tetrahydrocannabinol POSITIVE (*)    All other components within normal  limits  RESP PANEL BY RT-PCR (FLU A&B, COVID) ARPGX2  LIPASE, BLOOD  URINALYSIS, ROUTINE W REFLEX MICROSCOPIC  MAGNESIUM  PROTIME-INR  APTT  CBG MONITORING, ED  TROPONIN I (HIGH SENSITIVITY)  TROPONIN I (HIGH SENSITIVITY)    EKG EKG Interpretation  Date/Time:  Sunday July 25 2021 07:53:21 EST Ventricular Rate:  60 PR Interval:  178 QRS Duration: 124 QT Interval:  453 QTC Calculation: 453 R Axis:   14 Text Interpretation: Sinus rhythm IVCD, consider atypical RBBB No significant change since last tracing Confirmed by Blanchie Dessert [phone]) on 07/25/2021 8:12:18 AM  Radiology DG Chest 2 View  Result Date: 07/25/2021 CLINICAL DATA:  Initial evaluation for acute syncope. EXAM: CHEST - 2 VIEW COMPARISON:  None available. FINDINGS: Transverse heart size at the upper limits of normal. Mediastinal silhouette within normal limits. Lungs hypoinflated. Mild streaky bibasilar subsegmental atelectasis. No focal infiltrates. No edema or effusion. No pneumothorax. No acute osseous finding. IMPRESSION: 1. Shallow lung inflation with mild bibasilar subsegmental atelectasis. 2. No other active cardiopulmonary disease. Electronically Signed   By: Jeannine Boga M.D.   On: 07/25/2021 02:07   CT HEAD WO CONTRAST (5MM)  Result Date: 07/25/2021 CLINICAL DATA:  Dizziness, head injury, ETOH, marijuana EXAM: CT HEAD WITHOUT CONTRAST CT CERVICAL SPINE WITHOUT CONTRAST TECHNIQUE: Multidetector CT imaging of the head and cervical spine was performed following the standard protocol without intravenous contrast. Multiplanar CT image reconstructions of the  cervical spine were also generated. COMPARISON:  None. FINDINGS: CT HEAD FINDINGS Brain: No evidence of acute infarction, hemorrhage, hydrocephalus, extra-axial collection or mass lesion/mass effect. Vascular: No hyperdense vessel or unexpected calcification. Skull: Normal. Negative for fracture or focal lesion. Sinuses/Orbits: The visualized  paranasal sinuses are essentially clear. The mastoid air cells are unopacified. Other: None. CT CERVICAL SPINE FINDINGS Alignment: Normal cervical lordosis. Skull base and vertebrae: No acute fracture. No primary bone lesion or focal pathologic process. Soft tissues and spinal canal: No prevertebral fluid or swelling. No visible canal hematoma. Disc levels: C5-6 ACDF, without evidence of complication. Mild degenerative changes at C3-4 and C4-5. Spinal canal is patent. Upper chest: Visualized lung apices are clear. Other: Visualized thyroid is unremarkable. IMPRESSION: Normal head CT. No evidence of traumatic injury to the cervical spine. Status post C5-6 ACDF, without evidence of complication. Mild degenerative changes of the mid cervical spine. Electronically Signed   By: Julian Hy M.D.   On: 07/25/2021 03:00   CT Cervical Spine Wo Contrast  Result Date: 07/25/2021 CLINICAL DATA:  Dizziness, head injury, ETOH, marijuana EXAM: CT HEAD WITHOUT CONTRAST CT CERVICAL SPINE WITHOUT CONTRAST TECHNIQUE: Multidetector CT imaging of the head and cervical spine was performed following the standard protocol without intravenous contrast. Multiplanar CT image reconstructions of the cervical spine were also generated. COMPARISON:  None. FINDINGS: CT HEAD FINDINGS Brain: No evidence of acute infarction, hemorrhage, hydrocephalus, extra-axial collection or mass lesion/mass effect. Vascular: No hyperdense vessel or unexpected calcification. Skull: Normal. Negative for fracture or focal lesion. Sinuses/Orbits: The visualized paranasal sinuses are essentially clear. The mastoid air cells are unopacified. Other: None. CT CERVICAL SPINE FINDINGS Alignment: Normal cervical lordosis. Skull base and vertebrae: No acute fracture. No primary bone lesion or focal pathologic process. Soft tissues and spinal canal: No prevertebral fluid or swelling. No visible canal hematoma. Disc levels: C5-6 ACDF, without evidence of  complication. Mild degenerative changes at C3-4 and C4-5. Spinal canal is patent. Upper chest: Visualized lung apices are clear. Other: Visualized thyroid is unremarkable. IMPRESSION: Normal head CT. No evidence of traumatic injury to the cervical spine. Status post C5-6 ACDF, without evidence of complication. Mild degenerative changes of the mid cervical spine. Electronically Signed   By: Julian Hy M.D.   On: 07/25/2021 03:00    Procedures Procedures   Medications Ordered in ED Medications  lactated ringers bolus 1,000 mL (0 mLs Intravenous Stopped 07/25/21 0900)    ED Course  I have reviewed the triage vital signs and the nursing notes.  Pertinent labs & imaging results that were available during my care of the patient were reviewed by me and considered in my medical decision making (see chart for details).    MDM Rules/Calculators/A&P                           65 year old male presenting today after a syncopal event most likely a result of alcohol and marijuana use.  Alcohol level is relatively low at 27 and he is positive for marijuana.  However EKG without acute findings, troponin within normal limits, CBC and CMP within normal limits, magnesium,Lipase, urine and head CT are all negative, CT of the cervical spine shows prior surgical findings with no acute changes.  Patient has no focal findings on exam.  He was given IV fluids and p.o. challenged without difficulty.  He was able to stand and walk and reported feeling slightly dizzy but overall was feeling better.  No evidence of ataxia or focal neurological finding.  Feel the patient is stable for discharge.  He was given return precautions.  MDM   Amount and/or Complexity of Data Reviewed Clinical lab tests: ordered and reviewed Tests in the radiology section of CPT: ordered and reviewed Tests in the medicine section of CPT: ordered and reviewed Independent visualization of images, tracings, or specimens: yes    Final  Clinical Impression(s) / ED Diagnoses Final diagnoses:  Syncope, unspecified syncope type  Cannabis intoxication with complication [facility])    Rx / DC Orders ED Discharge Orders     None        Blanchie Dessert, MD 07/25/21 [phone]

## 2021-07-26 DIAGNOSIS — J3081 Allergic rhinitis due to animal (cat) (dog) hair and dander: Secondary | ICD-10-CM | POA: Diagnosis not present

## 2021-07-26 DIAGNOSIS — J3089 Other allergic rhinitis: Secondary | ICD-10-CM | POA: Diagnosis not present

## 2021-07-26 DIAGNOSIS — J301 Allergic rhinitis due to pollen: Secondary | ICD-10-CM | POA: Diagnosis not present

## 2021-07-27 ENCOUNTER — Other Ambulatory Visit: Payer: Self-pay

## 2021-07-27 ENCOUNTER — Ambulatory Visit: Payer: PPO

## 2021-07-27 DIAGNOSIS — M542 Cervicalgia: Secondary | ICD-10-CM

## 2021-07-27 DIAGNOSIS — R293 Abnormal posture: Secondary | ICD-10-CM

## 2021-07-27 DIAGNOSIS — R252 Cramp and spasm: Secondary | ICD-10-CM

## 2021-07-27 NOTE — Therapy (Signed)
[facility] @ [address] [place], [place], [address] Phone: [phone]   Fax:  [phone]  Physical Therapy Treatment  Patient Details  Name: Stanley Bennett MRN: [id number] Date of Birth: [birth year] Referring Provider (PT): Hulan Saas, MD   Encounter Date: 07/27/2021 Progress Note Reporting Period 06/09/21 to 07/27/21  See note below for Objective Data and Assessment of Progress/Goals.      PT End of Session - 07/27/21 1303     Visit Number 10    Date for PT Re-Evaluation 08/04/21    Authorization Type [employer]    Progress Note Due on Visit 20    PT Start Time 1231    PT Stop Time 1304    PT Time Calculation (min) 33 min    Activity Tolerance Patient tolerated treatment well    Behavior During Therapy WFL for tasks assessed/performed             Past Medical History:  Diagnosis Date   Allergy    seasonal allergies   Arthritis    generalized   Basal cell carcinoma    scalp   GERD (gastroesophageal reflux disease)    on meds   Gout    on gout   Hyperlipidemia    on meds    Past Surgical History:  Procedure Laterality Date   APPENDECTOMY     BASAL CELL CARCINOMA EXCISION     CERVICAL DISC SURGERY     COLONOSCOPY  05/2017   MAC-suprep(exc)-polyps   TONSILLECTOMY     UPPER GASTROINTESTINAL ENDOSCOPY  05/2017   normal   VASECTOMY     WISDOM TOOTH EXTRACTION      There were no vitals filed for this visit.   Subjective Assessment - 07/27/21 1229     Subjective I am doing good.  60% overall improvement since the start of care.    How long can you sit comfortably? sitting limited to 30 minutes- hamstring and low back limitations    Currently in Pain? Yes    Pain Score 0-No pain                [facility] PT Assessment - 07/27/21 0001       Assessment   Medical Diagnosis DDD cervical    Referring Provider (PT) Hulan Saas, MD      Prior Function   Level of  Independence Independent    Vocation Retired      Strength   Right Hand Grip (lbs) 76    Left Hand Grip (lbs) 87      Palpation   Palpation comment trigger points and tenderness in bil cervical paraspinals, upper traps and thoracic paraspinals      Ambulation/Gait   Ambulation/Gait Yes    Ambulation/Gait Assistance 7: Independent                           [facility] Adult PT Treatment/Exercise - 07/27/21 0001       Neck Exercises: Theraband   Shoulder Extension 20 reps;Blue    Rows 20 reps;Blue    Shoulder External Rotation 20 reps;Blue      Shoulder Exercises: ROM/Strengthening   UBE (Upper Arm Bike) 6 min level 1 3 min fwd, 3 min back      Manual Therapy   Manual Therapy Soft tissue mobilization;Myofascial release;Passive ROM    Manual therapy comments skilled paplation and monitoring with DN today    Soft  tissue mobilization bil upper traps, cervical paraspinals and suboccipitals              Trigger Point Dry Needling - 07/27/21 0001     Consent Given? Yes    Education Handout Provided Previously provided    Muscles Treated Head and Neck Oblique capitus;Upper trapezius;Levator scapulae;Cervical multifidi    Upper Trapezius Response Twitch reponse elicited;Palpable increased muscle length    Oblique Capitus Response Twitch response elicited;Palpable increased muscle length    Levator Scapulae Response Twitch response elicited;Palpable increased muscle length    Cervical multifidi Response Twitch reponse elicited;Palpable increased muscle length                     PT Short Term Goals - 07/19/21 1019       PT SHORT TERM GOAL #1   Title independent with initial HEP    Status Achieved      PT SHORT TERM GOAL #2   Title report a 30% reduction in neck pain with daily tasks    Baseline still with pain on the Lt, 50-60% overall reduction since start of care    Status Achieved      PT SHORT TERM GOAL #3   Title report frequent postural  corrections to improve alignment with sitting and standing    Status On-going               PT Long Term Goals - 07/27/21 1306       PT LONG TERM GOAL #1   Title The patient will be indep with HEP and understand how to progress himself    Status On-going      PT LONG TERM GOAL #2   Title improve FOTO to > or = to 61    Baseline 53    Status On-going      PT LONG TERM GOAL #3   Title report > or = to 60% reduction in the frequency and intensity of headaches and neck pain with daily tasks    Baseline 60%    Status Achieved      PT LONG TERM GOAL #4   Title demonstrate neutral posture with scapular retraction > or = to 75% of the time to reduce UE radiculopathy    Baseline working on posture, feels tight in shoulder blades, no change in UE symptoms                   Plan - 07/27/21 1309     Clinical Impression Statement Pt reports 60% improvement in symptoms and improved postural awareness and ability to sit with neutral posture.  Pt denies any changes in her UE symptoms since the start of care and reports tingling and not pain in bil UEs.  Pt with significant improvement in tension and trigger points in bil neck with manual therapy and DN.  Pt advanced to blue theraband today and performed all exercises without need to provide tactile cues.  Pt will benefit from skilled PT to address neck pain, stiffness and postural dysfunction,.    PT Frequency 2x / week    PT Treatment/Interventions ADLs/Self Care Home Management;Cryotherapy;Electrical Stimulation;Moist Heat;Traction;Neuromuscular re-education;Therapeutic exercise;Therapeutic activities;Patient/family education;Manual techniques;Passive range of motion;Dry needling;Joint Manipulations;Spinal Manipulations;Taping    PT Next Visit Plan manual to address cervical mobility, postural strength    PT Home Exercise Plan Access Code: [email or web address]    Consulted and Agree with Plan of Care Patient  Patient will  benefit from skilled therapeutic intervention in order to improve the following deficits and impairments:  Decreased strength, Postural dysfunction, Impaired flexibility, Pain, Increased muscle spasms, Decreased range of motion  Visit Diagnosis: Cervicalgia  Abnormal posture  Cramp and spasm     Problem List Patient Active Problem List   Diagnosis Date Noted   Trigger point of right shoulder region 05/13/2021   DDD (degenerative disc disease), cervical 04/11/2021   Nonobstructive atherosclerosis of coronary artery 01/19/2020   Pure hypercholesterolemia 01/19/2020   Family history of heart disease 01/19/2020   Chronic right-sided low back pain with right-sided sciatica 02/12/2019   Gout 08/07/2016   H/O seasonal allergies 08/07/2016   Hyperlipidemia 08/07/2016   Chronic pain of both knees 08/07/2016   Erectile dysfunction 08/05/2016   Gastroesophageal reflux disease 08/05/2016   Sigurd Sos, PT 07/27/21 1:11 PM  [facility] @ [address] [place], [place], [address] Phone: [phone]   Fax:  [phone]  Name: Stanley Bennett MRN: [id number] Date of Birth: [birth year]

## 2021-07-29 ENCOUNTER — Other Ambulatory Visit: Payer: Self-pay

## 2021-07-29 ENCOUNTER — Ambulatory Visit: Payer: PPO

## 2021-07-29 DIAGNOSIS — M542 Cervicalgia: Secondary | ICD-10-CM

## 2021-07-29 DIAGNOSIS — R293 Abnormal posture: Secondary | ICD-10-CM

## 2021-07-29 DIAGNOSIS — R252 Cramp and spasm: Secondary | ICD-10-CM

## 2021-07-29 NOTE — Therapy (Signed)
[facility] @ [address] [place], [place], [address] Phone: [phone]   Fax:  [phone]  Physical Therapy Treatment  Patient Details  Name: CECILIO OHLRICH MRN: [id number] Date of Birth: [birth year] Referring Provider (PT): Hulan Saas, MD   Encounter Date: 07/29/2021   PT End of Session - 07/29/21 1059     Visit Number 11    Date for PT Re-Evaluation 08/04/21    Authorization Type [employer]    PT Start Time 1020    PT Stop Time 1055    PT Time Calculation (min) 35 min    Activity Tolerance Patient tolerated treatment well    Behavior During Therapy [facility] for tasks assessed/performed             Past Medical History:  Diagnosis Date   Allergy    seasonal allergies   Arthritis    generalized   Basal cell carcinoma    scalp   GERD (gastroesophageal reflux disease)    on meds   Gout    on gout   Hyperlipidemia    on meds    Past Surgical History:  Procedure Laterality Date   APPENDECTOMY     BASAL CELL CARCINOMA EXCISION     CERVICAL DISC SURGERY     COLONOSCOPY  05/2017   MAC-suprep(exc)-polyps   TONSILLECTOMY     UPPER GASTROINTESTINAL ENDOSCOPY  05/2017   normal   VASECTOMY     WISDOM TOOTH EXTRACTION      There were no vitals filed for this visit.   Subjective Assessment - 07/29/21 1028     Subjective I am feeling good.    Pertinent History ACDF C5-6 in 2010    Currently in Pain? No/denies                               [facility] Adult PT Treatment/Exercise - 07/29/21 0001       Neck Exercises: Theraband   Shoulder Extension 20 reps;Blue    Rows 20 reps;Blue    Shoulder External Rotation 20 reps;Blue    Horizontal ABduction 20 reps;Green    Horizontal ABduction Limitations seated on blue ball      Neck Exercises: Stretches   Other Neck Stretches Doorway stretch for throracic stretch      Shoulder Exercises: Seated   Other Seated Exercises 3  way raises: 3# added seated on blue ball x10 each      Shoulder Exercises: Prone   Other Prone Exercises TYI's over physio ball 3 lbs both hands x 10      Shoulder Exercises: ROM/Strengthening   UBE (Upper Arm Bike) 6 min level 1.5 3 min fwd, 3 min back      Manual Therapy   Manual Therapy Soft tissue mobilization;Myofascial release;Passive ROM    Soft tissue mobilization bil upper traps, cervical paraspinals and suboccipitals                       PT Short Term Goals - 07/19/21 1019       PT SHORT TERM GOAL #1   Title independent with initial HEP    Status Achieved      PT SHORT TERM GOAL #2   Title report a 30% reduction in neck pain with daily tasks    Baseline still with pain on the Lt, 50-60% overall reduction since start of care    Status  Achieved      PT SHORT TERM GOAL #3   Title report frequent postural corrections to improve alignment with sitting and standing    Status On-going               PT Long Term Goals - 07/27/21 1306       PT LONG TERM GOAL #1   Title The patient will be indep with HEP and understand how to progress himself    Status On-going      PT LONG TERM GOAL #2   Title improve FOTO to > or = to 61    Baseline 53    Status On-going      PT LONG TERM GOAL #3   Title report > or = to 60% reduction in the frequency and intensity of headaches and neck pain with daily tasks    Baseline 60%    Status Achieved      PT LONG TERM GOAL #4   Title demonstrate neutral posture with scapular retraction > or = to 75% of the time to reduce UE radiculopathy    Baseline working on posture, feels tight in shoulder blades, no change in UE symptoms                   Plan - 07/29/21 1101     Clinical Impression Statement Pt reports 60% improvement in symptoms and improved postural awareness and ability to sit with neutral posture.  Pt denies any changes in her UE symptoms since the start of care and reports tingling and not pain  in bil UEs.  Pt with significant improvement in tension and trigger points in bil neck with manual therapy today.  Pt advanced to blue theraband this week and performed all exercises without need to provide tactile cues.  Pt will attend 1 more session for DN, HEP advancement as needed.    PT Frequency 2x / week    PT Duration 8 weeks    PT Treatment/Interventions ADLs/Self Care Home Management;Cryotherapy;Electrical Stimulation;Moist Heat;Traction;Neuromuscular re-education;Therapeutic exercise;Therapeutic activities;Patient/family education;Manual techniques;Passive range of motion;Dry needling;Joint Manipulations;Spinal Manipulations;Taping    PT Next Visit Plan 1 more session.  DN and finalize HEP    PT Home Exercise Plan Access Code: [email or web address]    Consulted and Agree with Plan of Care Patient             Patient will benefit from skilled therapeutic intervention in order to improve the following deficits and impairments:  Decreased strength, Postural dysfunction, Impaired flexibility, Pain, Increased muscle spasms, Decreased range of motion  Visit Diagnosis: Cervicalgia  Abnormal posture  Cramp and spasm     Problem List Patient Active Problem List   Diagnosis Date Noted   Trigger point of right shoulder region 05/13/2021   DDD (degenerative disc disease), cervical 04/11/2021   Nonobstructive atherosclerosis of coronary artery 01/19/2020   Pure hypercholesterolemia 01/19/2020   Family history of heart disease 01/19/2020   Chronic right-sided low back pain with right-sided sciatica 02/12/2019   Gout 08/07/2016   H/O seasonal allergies 08/07/2016   Hyperlipidemia 08/07/2016   Chronic pain of both knees 08/07/2016   Erectile dysfunction 08/05/2016   Gastroesophageal reflux disease 08/05/2016   Sigurd Sos, PT 07/29/21 11:02 AM   [facility] @ [place], [place], [address] Phone: [phone]   Fax:   [phone]  Name: AKSHATH MCCAREY MRN: [id number] Date of Birth: [birth year]

## 2021-08-03 ENCOUNTER — Other Ambulatory Visit: Payer: Self-pay

## 2021-08-03 ENCOUNTER — Ambulatory Visit: Payer: PPO

## 2021-08-03 DIAGNOSIS — R293 Abnormal posture: Secondary | ICD-10-CM

## 2021-08-03 DIAGNOSIS — M542 Cervicalgia: Secondary | ICD-10-CM | POA: Diagnosis not present

## 2021-08-03 DIAGNOSIS — R252 Cramp and spasm: Secondary | ICD-10-CM

## 2021-08-03 NOTE — Therapy (Signed)
[facility] @ [address] [place], [place], [address] Phone: [phone]   Fax:  [phone]  Physical Therapy Treatment  Patient Details  Name: Stanley Bennett MRN: [id number] Date of Birth: 05-27-1956 Referring Provider (PT): Hulan Saas, MD   Encounter Date: 08/03/2021   PT End of Session - 08/03/21 1255     Visit Number 12    PT Start Time 1219    PT Stop Time 1448    PT Time Calculation (min) 36 min    Activity Tolerance Patient tolerated treatment well    Behavior During Therapy [facility] for tasks assessed/performed             Past Medical History:  Diagnosis Date   Allergy    seasonal allergies   Arthritis    generalized   Basal cell carcinoma    scalp   GERD (gastroesophageal reflux disease)    on meds   Gout    on gout   Hyperlipidemia    on meds    Past Surgical History:  Procedure Laterality Date   APPENDECTOMY     BASAL CELL CARCINOMA EXCISION     CERVICAL DISC SURGERY     COLONOSCOPY  05/2017   MAC-suprep(exc)-polyps   TONSILLECTOMY     UPPER GASTROINTESTINAL ENDOSCOPY  05/2017   normal   VASECTOMY     WISDOM TOOTH EXTRACTION      There were no vitals filed for this visit.   Subjective Assessment - 08/03/21 1219     Subjective I'm ready to D/C to HEP.    Patient Stated Goals stay active, figure out what is going on, reduce pain    Currently in Pain? Yes    Pain Score 2     Pain Location Neck    Pain Orientation Right    Pain Descriptors / Indicators Aching    Pain Type Chronic pain    Pain Onset More than a month ago    Pain Frequency Intermittent    Aggravating Factors  no pattern    Pain Relieving Factors stretching, TylenolAleve                [facility] PT Assessment - 08/03/21 0001       Assessment   Medical Diagnosis DDD cervical    Referring Provider (PT) Hulan Saas, MD      Prior Function   Level of Independence Independent    Vocation Retired       Observation/Other Assessments   Focus on Therapeutic Outcomes (FOTO)  34                           [facility] [facility] Adult PT Treatment/Exercise - 08/03/21 0001       Neck Exercises: Stretches   Other Neck Stretches Doorway stretch for throracic stretch      Shoulder Exercises: Seated   Other Seated Exercises 3 way raises: 3# added seated on blue ball x10 each      Shoulder Exercises: ROM/Strengthening   UBE (Upper Arm Bike) 6 min level 1.5 3 min fwd, 3 min back   PT present to discuss status     Manual Therapy   Manual Therapy Soft tissue mobilization;Myofascial release;Passive ROM    Manual therapy comments skilled paplation and monitoring with DN today    Soft tissue mobilization bil upper traps, cervical paraspinals and suboccipitals  PT Short Term Goals - 07/19/21 1019       PT SHORT TERM GOAL #1   Title independent with initial HEP    Status Achieved      PT SHORT TERM GOAL #2   Title report a 30% reduction in neck pain with daily tasks    Baseline still with pain on the Lt, 50-60% overall reduction since start of care    Status Achieved      PT SHORT TERM GOAL #3   Title report frequent postural corrections to improve alignment with sitting and standing    Status On-going               PT Long Term Goals - 08/03/21 1224       PT LONG TERM GOAL #1   Title The patient will be indep with HEP and understand how to progress himself    Status Achieved      PT LONG TERM GOAL #2   Title improve FOTO to > or = to 61    Baseline 69    Status Achieved      PT LONG TERM GOAL #3   Title report > or = to 60% reduction in the frequency and intensity of headaches and neck pain with daily tasks    Baseline 60%    Status Achieved      PT LONG TERM GOAL #4   Title demonstrate neutral posture with scapular retraction > or = to 75% of the time to reduce UE radiculopathy    Status Achieved                   Plan  - 08/03/21 1235     Clinical Impression Statement Pt is ready to D/C to HEP today. Pt reports 60% improvement in symptoms and improved postural awareness and ability to sit with neutral posture.  FOTO is improved to 69, meeting goal.  Pt denies any changes in her UE symptoms since the start of care and reports tingling and not pain in bil UEs. Pt with significant improvement in muscle tension and trigger points and had good response to DN and manual therapy today with reduced tension noted at end of session.  Pt will D/C to HEP.    PT Next Visit Plan D/C PT to HEP    PT Home Exercise Plan Access Code: [facility]    Consulted and Agree with Plan of Care Patient             Patient will benefit from skilled therapeutic intervention in order to improve the following deficits and impairments:     Visit Diagnosis: Cervicalgia  Abnormal posture  Cramp and spasm     Problem List Patient Active Problem List   Diagnosis Date Noted   Trigger point of right shoulder region 05/13/2021   DDD (degenerative disc disease), cervical 04/11/2021   Nonobstructive atherosclerosis of coronary artery 01/19/2020   Pure hypercholesterolemia 01/19/2020   Family history of heart disease 01/19/2020   Chronic right-sided low back pain with right-sided sciatica 02/12/2019   Gout 08/07/2016   H/O seasonal allergies 08/07/2016   Hyperlipidemia 08/07/2016   Chronic pain of both knees 08/07/2016   Erectile dysfunction 08/05/2016   Gastroesophageal reflux disease 08/05/2016  PHYSICAL THERAPY DISCHARGE SUMMARY  Visits from Start of Care: 12  Current functional level related to goals / functional outcomes: See above for current status.    Remaining deficits: See above.     Education / Equipment: HEP,  posture   Patient agrees to discharge. Patient goals were met. Patient is being discharged due to meeting the stated rehab goals.  Sigurd Sos, PT 08/03/21 12:57 PM   [facility] @ [address] [place], [place], [address] Phone: [phone]   Fax:  [phone]  Name: Stanley Bennett MRN: [id number] Date of Birth: September 15, 1955

## 2021-08-04 DIAGNOSIS — J3089 Other allergic rhinitis: Secondary | ICD-10-CM | POA: Diagnosis not present

## 2021-08-04 DIAGNOSIS — J301 Allergic rhinitis due to pollen: Secondary | ICD-10-CM | POA: Diagnosis not present

## 2021-08-04 DIAGNOSIS — J3081 Allergic rhinitis due to animal (cat) (dog) hair and dander: Secondary | ICD-10-CM | POA: Diagnosis not present

## 2021-08-20 DIAGNOSIS — J3081 Allergic rhinitis due to animal (cat) (dog) hair and dander: Secondary | ICD-10-CM | POA: Diagnosis not present

## 2021-08-20 DIAGNOSIS — J301 Allergic rhinitis due to pollen: Secondary | ICD-10-CM | POA: Diagnosis not present

## 2021-08-20 DIAGNOSIS — J3089 Other allergic rhinitis: Secondary | ICD-10-CM | POA: Diagnosis not present

## 2021-08-26 DIAGNOSIS — H60392 Other infective otitis externa, left ear: Secondary | ICD-10-CM | POA: Diagnosis not present

## 2021-08-26 DIAGNOSIS — H6123 Impacted cerumen, bilateral: Secondary | ICD-10-CM | POA: Diagnosis not present

## 2021-09-02 DIAGNOSIS — D225 Melanocytic nevi of trunk: Secondary | ICD-10-CM | POA: Diagnosis not present

## 2021-09-02 DIAGNOSIS — L821 Other seborrheic keratosis: Secondary | ICD-10-CM | POA: Diagnosis not present

## 2021-09-02 DIAGNOSIS — J3081 Allergic rhinitis due to animal (cat) (dog) hair and dander: Secondary | ICD-10-CM | POA: Diagnosis not present

## 2021-09-02 DIAGNOSIS — J3089 Other allergic rhinitis: Secondary | ICD-10-CM | POA: Diagnosis not present

## 2021-09-02 DIAGNOSIS — L570 Actinic keratosis: Secondary | ICD-10-CM | POA: Diagnosis not present

## 2021-09-02 DIAGNOSIS — D1801 Hemangioma of skin and subcutaneous tissue: Secondary | ICD-10-CM | POA: Diagnosis not present

## 2021-09-02 DIAGNOSIS — J301 Allergic rhinitis due to pollen: Secondary | ICD-10-CM | POA: Diagnosis not present

## 2021-09-02 DIAGNOSIS — Z85828 Personal history of other malignant neoplasm of skin: Secondary | ICD-10-CM | POA: Diagnosis not present

## 2021-09-02 DIAGNOSIS — L814 Other melanin hyperpigmentation: Secondary | ICD-10-CM | POA: Diagnosis not present

## 2021-09-13 ENCOUNTER — Other Ambulatory Visit: Payer: Self-pay | Admitting: Family Medicine

## 2021-09-15 DIAGNOSIS — J301 Allergic rhinitis due to pollen: Secondary | ICD-10-CM | POA: Diagnosis not present

## 2021-09-15 DIAGNOSIS — J3081 Allergic rhinitis due to animal (cat) (dog) hair and dander: Secondary | ICD-10-CM | POA: Diagnosis not present

## 2021-09-15 DIAGNOSIS — J3089 Other allergic rhinitis: Secondary | ICD-10-CM | POA: Diagnosis not present

## 2021-09-20 ENCOUNTER — Other Ambulatory Visit: Payer: Self-pay | Admitting: Family Medicine

## 2021-09-20 DIAGNOSIS — E7800 Pure hypercholesterolemia, unspecified: Secondary | ICD-10-CM

## 2021-09-29 DIAGNOSIS — J301 Allergic rhinitis due to pollen: Secondary | ICD-10-CM | POA: Diagnosis not present

## 2021-09-29 DIAGNOSIS — J3089 Other allergic rhinitis: Secondary | ICD-10-CM | POA: Diagnosis not present

## 2021-09-29 DIAGNOSIS — J3081 Allergic rhinitis due to animal (cat) (dog) hair and dander: Secondary | ICD-10-CM | POA: Diagnosis not present

## 2021-10-05 NOTE — Progress Notes (Signed)
Stanley Bennett [address] [place] Phone: [phone] Subjective:   IVilma Bennett, am serving as a scribe for Dr. Hulan Saas. This visit occurred during the SARS-CoV-2 public health emergency.  Safety protocols were in place, including screening questions prior to the visit, additional usage of staff PPE, and extensive cleaning of exam room while observing appropriate contact time as indicated for disinfecting solutions.   I'm seeing this patient by the request  of:  Koberlein, Steele Berg, MD  CC: left hamstring and right hip pain   LKT:[id number]  05/13/2021 Patient has a stone at the inferior angle of the scapula.  Patient responded relatively well.  Hopefully this will make some difference.  Patient has not noticed significant improvement with the gabapentin at this moment.  Posture and illness.  Follow-up with me again in 6 weeks.  Once again do feel it is more secondary to cervical radiculopathy and awaiting MRI of the cervical spine.  Patient has had a history of the fusion previously.  Patient does have adjacent segment disease noted on exam.  Continuing to have what seems to be more radicular symptoms again.  Patient is even having some mild weakness noted today.  He attempted some trigger point injections in the shoulder region secondary to the amount of discomfort but did not feel that it would be likely short-lived.  Patient will have MRI of the neck secondary to the weakness and as well as the findings on the x-ray and patient having this affecting daily activities.  Patient and will follow up with me again after imaging we will discuss the possibility for the epidural.  Updated 10/06/2021 Stanley Bennett is a 66 y.o. male coming in with complaint of left hamstring and right hip. Not getting any better.  Pain in hip on the lateral side. Hamstring is more burning cramp. Sitting for about 20 mins starts to get uncomfortable. No other  complaints.       Past Medical History:  Diagnosis Date   Allergy    seasonal allergies   Arthritis    generalized   Basal cell carcinoma    scalp   GERD (gastroesophageal reflux disease)    on meds   Gout    on gout   Hyperlipidemia    on meds   Past Surgical History:  Procedure Laterality Date   APPENDECTOMY     BASAL CELL CARCINOMA EXCISION     CERVICAL DISC SURGERY     COLONOSCOPY  05/2017   MAC-suprep(exc)-polyps   TONSILLECTOMY     UPPER GASTROINTESTINAL ENDOSCOPY  05/2017   normal   VASECTOMY     WISDOM TOOTH EXTRACTION     Social History   Socioeconomic History   Marital status: Married    Spouse name: [name]   Number of children: 3   Years of education: Not on file   Highest education level: Not on file  Occupational History   Occupation: Development worker, international aid    Comment: retired  Tobacco Use   Smoking status: Never   Smokeless tobacco: Never  Vaping Use   Vaping Use: Never used  Substance and Sexual Activity   Alcohol use: Yes    Alcohol/week: 5.0 standard drinks    Types: 5 Standard drinks or equivalent per week    Comment: 5   Drug use: No   Sexual activity: Yes    Partners: Female  Other Topics Concern   Not on file  Social History Narrative  Work or School: [employer], 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      Home Situation: lives with wife      Spiritual Beliefs: Catholic      Lifestyle: regular exercise and healthy diet   Social Determinants of Radio broadcast assistant Strain: Low Risk    Difficulty of Paying Living Expenses: Not hard at all  Food Insecurity: No Food Insecurity   Worried About Charity fundraiser in the Last Year: Never true   Arboriculturist in the Last Year: Never true  Transportation Needs: No Transportation Needs   Lack of Transportation (Medical): No   Lack of Transportation (Non-Medical): No  Physical Activity: Sufficiently Active   Days of Exercise per Week: 6 days   Minutes of Exercise  per Session: 90 min  Stress: No Stress Concern Present   Feeling of Stress : Not at all  Social Connections: Not on file   Allergies  Allergen Reactions   Teriflunomide Other (See Comments)   Tamiflu [Oseltamivir] Rash    Mild rash 2018   Family History  Problem Relation Age of Onset   Arthritis Mother        osteo   Arthritis Father        osteo   Heart disease Father        (pneumonia cause of death 32 yrs)   Prostate cancer Father    Diabetes Sister    Ovarian cancer Sister    Other Brother        celiac sprue   Prostate cancer Brother 39   Kidney failure Cousin        pat cousin   Kidney failure Cousin        pat cousin   Colon cancer Neg Hx    Esophageal cancer Neg Hx    Rectal cancer Neg Hx    Colon polyps Neg Hx    Stomach cancer Neg Hx      Current Outpatient Medications (Cardiovascular):    EPINEPHrine 0.3 mg/0.3 mL IJ SOAJ injection, SMARTSIG:Injection As Directed   nitroGLYCERIN (NITROSTAT) 0.4 MG SL tablet, Place 1 tablet (0.4 mg total) under the tongue every 5 (five) minutes as needed for chest pain. Do not use within 48 ours of using cialis.   rosuvastatin (CRESTOR) 20 MG tablet, TAKE 1 TABLET(20 MG) BY MOUTH DAILY   tadalafil (CIALIS) 5 MG tablet, Take 1 tablet (5 mg total) by mouth daily.  Current Outpatient Medications (Respiratory):    fluticasone (FLONASE) 50 MCG/ACT nasal spray, Place 1 spray into both nostrils 2 (two) times daily.    loratadine (CLARITIN) 10 MG tablet, Take 10 mg by mouth daily.  Current Outpatient Medications (Analgesics):    allopurinol (ZYLOPRIM) 300 MG tablet, TAKE 1 TABLET(300 MG) BY MOUTH DAILY AS DIRECTED   aspirin EC 81 MG tablet, Take 1 tablet (81 mg total) by mouth daily.   Current Outpatient Medications (Other):    gabapentin (NEURONTIN) 100 MG capsule, Take 2 capsules (200 mg total) by mouth at bedtime.   pantoprazole (PROTONIX) 20 MG tablet, TAKE 1 TABLET(20 MG) BY MOUTH DAILY BEFORE AND BREAKFAST   PRESCRIPTION  MEDICATION, Allergy shots every other week   Vitamin D, Ergocalciferol, (DRISDOL) 1.25 MG (50000 UNIT) CAPS capsule, Take 1 capsule (50,000 Units total) by mouth every 7 (seven) days.   Reviewed prior external information including notes and imaging from  primary care provider As well as notes that were available from care everywhere and other healthcare  systems.  Past medical history, social, surgical and family history all reviewed in electronic medical record.  No pertanent information unless stated regarding to the chief complaint.   Review of Systems:  No headache, visual changes, nausea, vomiting, diarrhea, constipation, dizziness, abdominal pain, skin rash, fevers, chills, night sweats, weight loss, swollen lymph nodes, body aches, joint swelling, chest pain, shortness of breath, mood changes. POSITIVE muscle aches  Objective  Blood pressure 116/62, pulse (!) 59, height _0  (1.753 m), weight 164 lb (74.4 kg), SpO2 98 %.   General: No apparent distress alert and oriented x3 mood and affect normal, dressed appropriately.  HEENT: Pupils equal, extraocular movements intact  Respiratory: Patient's speak in full sentences and does not appear short of breath  Cardiovascular: No lower extremity edema, non tender, no erythema  Gait mild antalgic  MSK:  left hip shows tightness noted in the Left FABER patient has a negative straight leg test noted today. Patient noted does not have any significant tightness of the hamstring either but does have a tightness with FABER test.  More tenderness actually in the piriformis and palpation does cause some of the radicular symptoms. Right hand does have some decrease in internal and external range of motion.  Patient does have some 5 out of 5 strength all of the hip with any other type of motion.  [id number]; 15 additional minutes spent for Therapeutic exercises as stated in above notes.  This included exercises focusing on stretching, strengthening, with  significant focus on eccentric aspects.   Long term goals include an improvement in range of motion, strength, endurance as well as avoiding reinjury. Patient's frequency would include in 1-2 times a day, 3-5 times a week for a duration of 6-12 weeks. Hip strengthening exercises which included:  Pelvic tilt/bracing to help with proper recruitment of the lower abs and pelvic floor muscles  Glute strengthening to properly contract glutes without over-engaging low back and hamstrings - prone hip extension and glute bridge exercises Proper stretching techniques to increase effectiveness for the hip flexors, groin, quads, piriformic and low back when appropriate    Proper technique shown and discussed handout in great detail with ATC.  All questions were discussed and answered.      Impression and Recommendations:     The above documentation has been reviewed and is accurate and complete Lyndal Pulley, DO

## 2021-10-06 ENCOUNTER — Other Ambulatory Visit: Payer: Self-pay

## 2021-10-06 ENCOUNTER — Ambulatory Visit: Payer: PPO | Admitting: Family Medicine

## 2021-10-06 ENCOUNTER — Ambulatory Visit (INDEPENDENT_AMBULATORY_CARE_PROVIDER_SITE_OTHER): Payer: PPO

## 2021-10-06 VITALS — BP 116/62 | HR 59 | Ht 69.0 in | Wt 164.0 lb

## 2021-10-06 DIAGNOSIS — M25551 Pain in right hip: Secondary | ICD-10-CM | POA: Diagnosis not present

## 2021-10-06 DIAGNOSIS — M25851 Other specified joint disorders, right hip: Secondary | ICD-10-CM

## 2021-10-06 DIAGNOSIS — G5702 Lesion of sciatic nerve, left lower limb: Secondary | ICD-10-CM | POA: Diagnosis not present

## 2021-10-06 DIAGNOSIS — M1611 Unilateral primary osteoarthritis, right hip: Secondary | ICD-10-CM | POA: Diagnosis not present

## 2021-10-06 NOTE — Assessment & Plan Note (Signed)
Patient does have left piriformis syndrome, given home exercises and discussed icing regimen.  We discussed with patient about hip abductor strengthening exercises.  We discussed which activities to do and which ones to avoid.  Increase activity slowly.  Follow-up again in 6 to 8 weeks.

## 2021-10-06 NOTE — Patient Instructions (Addendum)
Do prescribed exercises at least 3x a week Sit on tennis ball when driving See you again in 6 weeks

## 2021-10-07 DIAGNOSIS — M25851 Other specified joint disorders, right hip: Secondary | ICD-10-CM | POA: Insufficient documentation

## 2021-10-07 NOTE — Assessment & Plan Note (Addendum)
Do believe the patient is having more impingement of the right hip.  On x-ray patient does have spurring of the lateral aspect. Discussed icing regimen and home exercises, we discussed which activities to do and which ones to avoid, increase activity slowly over the course the next several weeks.  Follow-up with me again in 6 to 8 weeks.  Worsening pain would like to consider potential injections for diagnostic purposes including possibly intra-articular versus the possibility of a lateral hip injection.  Differential also includes lumbar radiculopathy but left knee is less likely with the x-rays.

## 2021-10-14 DIAGNOSIS — J301 Allergic rhinitis due to pollen: Secondary | ICD-10-CM | POA: Diagnosis not present

## 2021-10-14 DIAGNOSIS — J3089 Other allergic rhinitis: Secondary | ICD-10-CM | POA: Diagnosis not present

## 2021-10-15 DIAGNOSIS — J301 Allergic rhinitis due to pollen: Secondary | ICD-10-CM | POA: Diagnosis not present

## 2021-10-15 DIAGNOSIS — J3089 Other allergic rhinitis: Secondary | ICD-10-CM | POA: Diagnosis not present

## 2021-10-15 DIAGNOSIS — J3081 Allergic rhinitis due to animal (cat) (dog) hair and dander: Secondary | ICD-10-CM | POA: Diagnosis not present

## 2021-10-19 ENCOUNTER — Other Ambulatory Visit: Payer: Self-pay

## 2021-10-19 ENCOUNTER — Ambulatory Visit (HOSPITAL_BASED_OUTPATIENT_CLINIC_OR_DEPARTMENT_OTHER): Payer: PPO | Admitting: Cardiology

## 2021-10-19 ENCOUNTER — Encounter (HOSPITAL_BASED_OUTPATIENT_CLINIC_OR_DEPARTMENT_OTHER): Payer: Self-pay | Admitting: Cardiology

## 2021-10-19 ENCOUNTER — Ambulatory Visit (INDEPENDENT_AMBULATORY_CARE_PROVIDER_SITE_OTHER): Payer: PPO

## 2021-10-19 VITALS — BP 110/62 | HR 54 | Ht 69.0 in | Wt 171.1 lb

## 2021-10-19 DIAGNOSIS — I493 Ventricular premature depolarization: Secondary | ICD-10-CM

## 2021-10-19 DIAGNOSIS — R002 Palpitations: Secondary | ICD-10-CM

## 2021-10-19 DIAGNOSIS — Z8249 Family history of ischemic heart disease and other diseases of the circulatory system: Secondary | ICD-10-CM

## 2021-10-19 DIAGNOSIS — R55 Syncope and collapse: Secondary | ICD-10-CM | POA: Diagnosis not present

## 2021-10-19 DIAGNOSIS — Z7189 Other specified counseling: Secondary | ICD-10-CM | POA: Diagnosis not present

## 2021-10-19 DIAGNOSIS — R001 Bradycardia, unspecified: Secondary | ICD-10-CM

## 2021-10-19 DIAGNOSIS — I2510 Atherosclerotic heart disease of native coronary artery without angina pectoris: Secondary | ICD-10-CM

## 2021-10-19 DIAGNOSIS — E7800 Pure hypercholesterolemia, unspecified: Secondary | ICD-10-CM | POA: Diagnosis not present

## 2021-10-19 NOTE — Progress Notes (Signed)
Cardiology Office Note:    Date:  10/21/2021   ID:  Stanley Bennett, DOB [birth year], MRN [id number]  PCP:  Caren Macadam, MD  Cardiologist:  Buford Dresser, MD  Referring MD: Caren Macadam, MD   CC: follow up  History of Present Illness:    Stanley Bennett is a 66 y.o. male with a hx of hyperlipidemia, family history of heart disease who is seen for follow up. I initially met him 10/29/19 as a new consult at the request of Koberlein, Junell C, MD for the evaluation and management of chest discomfort and variable heart rate.  Today: For the past several weeks, he reports a flutter. He notices the flutter primarily at night and the episodes can last for several hours. In January, his pulse dropped to the 30s bpm. He was sitting in a chair when his heart rate dropped. It elevated back to normal after about 15 minutes. He reports he was sick that week.  He reports losing consciousness this past Saturday which he associates with dehydration. He was standing up when he suddenly felt dizzy and woke up on the ground. He felt better after drinking water. He has had occasional episodes of positional dizziness starting several weeks ago.   He stays active by walking 6.25 miles and performing light weightlifting daily. He also plays golf at least once a week. He does not notice his palpitations or dizziness with exercise.    He has tried gabapentin in the past, but it was discontinued because it was ineffective. Instead, he has been taking vitamin B12 and D daily.   Denies chest pain, shortness of breath at rest or with normal exertion. No PND, orthopnea, LE edema or unexpected weight gain.  Past Medical History:  Diagnosis Date   Allergy    seasonal allergies   Arthritis    generalized   Basal cell carcinoma    scalp   GERD (gastroesophageal reflux disease)    on meds   Gout    on gout   Hyperlipidemia    on meds    Past Surgical History:  Procedure  Laterality Date   APPENDECTOMY     BASAL CELL CARCINOMA EXCISION     CERVICAL DISC SURGERY     COLONOSCOPY  05/2017   MAC-suprep(exc)-polyps   TONSILLECTOMY     UPPER GASTROINTESTINAL ENDOSCOPY  05/2017   normal   VASECTOMY     WISDOM TOOTH EXTRACTION      Current Medications: Current Outpatient Medications on File Prior to Visit  Medication Sig   allopurinol (ZYLOPRIM) 300 MG tablet TAKE 1 TABLET(300 MG) BY MOUTH DAILY AS DIRECTED   aspirin EC 81 MG tablet Take 1 tablet (81 mg total) by mouth daily.   EPINEPHrine 0.3 mg/0.3 mL IJ SOAJ injection SMARTSIG:Injection As Directed   fluticasone (FLONASE) 50 MCG/ACT nasal spray Place 1 spray into both nostrils 2 (two) times daily.    loratadine (CLARITIN) 10 MG tablet Take 10 mg by mouth daily.   nitroGLYCERIN (NITROSTAT) 0.4 MG SL tablet Place 1 tablet (0.4 mg total) under the tongue every 5 (five) minutes as needed for chest pain. Do not use within 48 ours of using cialis.   pantoprazole (PROTONIX) 20 MG tablet TAKE 1 TABLET(20 MG) BY MOUTH DAILY BEFORE AND BREAKFAST   PRESCRIPTION MEDICATION Allergy shots every other week   rosuvastatin (CRESTOR) 20 MG tablet TAKE 1 TABLET(20 MG) BY MOUTH DAILY   tadalafil (CIALIS) 5 MG tablet Take 1 tablet (5  mg total) by mouth daily.   Vitamin D, Ergocalciferol, (DRISDOL) 1.25 MG (50000 UNIT) CAPS capsule Take 1 capsule (50,000 Units total) by mouth every 7 (seven) days.   No current facility-administered medications on file prior to visit.     Allergies:   Teriflunomide and Tamiflu [oseltamivir]   Social History   Tobacco Use   Smoking status: Never   Smokeless tobacco: Never  Vaping Use   Vaping Use: Never used  Substance Use Topics   Alcohol use: Yes    Alcohol/week: 5.0 standard drinks    Types: 5 Standard drinks or equivalent per week    Comment: 5   Drug use: No    Family History: family history includes Arthritis in his father and mother; Diabetes in his sister; Heart disease in  his father; Kidney failure in his cousin and cousin; Other in his brother; Ovarian cancer in his sister; Prostate cancer in his father; Prostate cancer (age of onset: 48) in his brother. There is no history of Colon cancer, Esophageal cancer, Rectal cancer, Colon polyps, or Stomach cancer.  father had CABG at 25 years old. Sister just had a stent placed in her late 90s. They were both less active than he is.  ROS:   Please see the history of present illness.   (+) Palpitations (+) Loss of consciousness (+) Dizziness Additional pertinent ROS otherwise unremarkable.  EKGs/Labs/Other Studies Reviewed:    The following studies were reviewed today: CT FFR 12/04/19 FINDINGS: FFRct analysis was performed on the original cardiac CT angiogram dataset. Diagrammatic representation of the FFRct analysis is provided in a separate PDF document in PACS. This dictation was created using the PDF document and an interactive 3D model of the results. 3D model is not available in the EMR/PACS. Normal FFR range is >0.80.   1. Left Main:  No significant stenosis. FFR = 0.98   2. LAD: No significant stenosis. Proximal FFR = 0.95, Mid FFR =0.89, Distal FFR = 0.84 3. LCX: No significant stenosis. Proximal FFR = 0.96, Distal FFR = 0.94 4. RCA: No significant stenosis. Proximal FFR = 0.98, Mid FFR =0.95, Distal FFR = 0.92   IMPRESSION: 1.  CT FFR analysis did not show any significant stenosis.  Monitor 12/18/19 14 days of data recorded on Zio monitor. Patient had a min HR of 39 bpm, max HR of 148 bpm, and avg HR of 58 bpm. Predominant underlying rhythm was Sinus Rhythm. No VT, atrial fibrillation, high degree block, or pauses noted. Isolated atrial ectopy was rare (<1%), and isolated ventricular ectopy was occasional (2.6%). There were 5 triggered events, which were sinus with/without PVCs. 4 Supraventricular Tachycardia runs occurred, the run with the fastest interval lasting 5 beats with a max rate of 148 bpm,  the longest lasting 9 beats with an avg rate of 107 bpm.  Cardiac CT 12/03/19 IMPRESSION: 1. Mild nonobstructive CAD, CADRADS = 2. However, there is one lesion in the mid LAD that cannot be fully visualized due to calcium and may have higher degree stenosis. CT FFR will be performed and reported separately.   2. Coronary calcium score of 395. This was 79th percentile for age and sex matched control.   3. Normal coronary origin with right dominance.  FFR negative for significant stenosis.  EKG:  EKG was personally reviewed 10/19/21: Sinus bradycardia, rate 54 bpm 10/29/19: sinus bradycardia at 53 bpm  Recent Labs: 03/01/2021: TSH 3.78 07/25/2021: ALT 29; BUN 15; Creatinine, Ser 0.98; Hemoglobin 12.9; Magnesium 2.0; Platelets 280;  Potassium 3.3; Sodium 138  Recent Lipid Panel    Component Value Date/Time   CHOL 176 03/01/2021 0853   TRIG 47.0 03/01/2021 0853   HDL 85.50 03/01/2021 0853   CHOLHDL 2 03/01/2021 0853   VLDL 9.4 03/01/2021 0853   LDLCALC 81 03/01/2021 0853    Physical Exam:    VS:  BP 110/62 (BP Location: Left Arm, Patient Position: Sitting, Cuff Size: Normal)    Pulse (!) 54    Ht _0  (1.753 m)    Wt 171 lb 1.6 oz (77.6 kg)    BMI 25.27 kg/m     Wt Readings from Last 3 Encounters:  10/19/21 171 lb 1.6 oz (77.6 kg)  10/06/21 164 lb (74.4 kg)  07/25/21 162 lb (73.5 kg)    GEN: Well nourished, well developed in no acute distress HEENT: Normal, moist mucous membranes NECK: No JVD CARDIAC: regular rhythm, normal S1 and S2, no rubs or gallops. No murmur. VASCULAR: Radial and DP pulses 2+ bilaterally. No carotid bruits RESPIRATORY:  Clear to auscultation without rales, wheezing or rhonchi  ABDOMEN: Soft, non-tender, non-distended MUSCULOSKELETAL:  Ambulates independently SKIN: Warm and dry, no edema NEUROLOGIC:  Alert and oriented x 3. No focal neuro deficits noted. PSYCHIATRIC:  Normal affect   ASSESSMENT:    1. Heart palpitations   2. Syncope, unspecified  syncope type   3. Family history of heart disease   4. PVC (premature ventricular contraction)   5. Sinus bradycardia   6. Nonocclusive coronary atherosclerosis of native coronary artery   7. Pure hypercholesterolemia   8. Cardiac risk counseling   9. Counseling on health promotion and disease prevention     PLAN:    Palpitations Occasional PVCs, pSVT on monitor Sinus bradycardia Syncope -PVC burden 2.6%, also rare/brief (<10 beats) SVT -on no nodal agents -given increase in symptoms, will repeat monitor -episode of syncope had prodrome, improved with hydration. However, monitor will also help evaluate for arrhythmia or pauses  Nonobstructive CAD with prior chest pain:  -no further symptoms -tolerating aspirin 81 mg daily -tolerating rosuvastatin 20 mg daily -no beta blocker as he is in sinus bradycardia at baseline -prescribed sublingual nitroglycerin, counseled extensively on risk of NG and cialis. -counseled on red flag warning signs that need immediate medical attention   Hypercholesterolemia: -tolerating rosuvastatin 20 mg daily -lipids 02/19/20: Tchol 157, HDL 68, LDL 75, TG 65 -has made further lifestyle changes. Goal LDL <70, but ok to continue to focus on diet/lifestyle to get to goal for now   Family history of heart disease: secondary prevention as below  Cardiac risk counseling and prevention recommendations: -recommend heart healthy/Mediterranean diet, with whole grains, fruits, vegetable, fish, lean meats, nuts, and olive oil. Limit salt. -recommend moderate walking, 3-5 times/week for 30-50 minutes each session. Aim for at least 150 minutes.week. Goal should be pace of 3 miles/hours, or walking 1.5 miles in 30 minutes -recommend avoidance of tobacco products. Avoid excess alcohol.  Plan for follow up: 1 year or sooner as needed  Buford Dresser, MD, PhD [place]   [facility]   Medication Adjustments/Labs and Tests Ordered: Current medicines are  reviewed at length with the patient today.  Concerns regarding medicines are outlined above.  Orders Placed This Encounter  Procedures   LONG TERM MONITOR (3-14 DAYS)   EKG 12-Lead   No orders of the defined types were placed in this encounter.   Patient Instructions  Medication Instructions:  Your Physician recommend you continue on your current  medication as directed.    *If you need a refill on your cardiac medications before your next appointment, please call your pharmacy*   Lab Work: None ordered today   Testing/Procedures: Your physician has recommended that you wear a 14 day Zio monitor.   This monitor is a medical device that records the hearts electrical activity. Doctors most often use these monitors to diagnose arrhythmias. Arrhythmias are problems with the speed or rhythm of the heartbeat. The monitor is a small device applied to your chest. You can wear one while you do your normal daily activities. While wearing this monitor if you have any symptoms to push the button and record what you felt. Once you have worn this monitor for the period of time provider prescribed (Usually 14 days), you will return the monitor device in the postage paid box. Once it is returned they will download the data collected and provide [place] with a report which the provider will then review and we will call you with those results. Important tips:  Avoid showering during the first 24 hours of wearing the monitor. Avoid excessive sweating to help maximize wear time. Do not submerge the device, no hot tubs, and no swimming pools. Keep any lotions or oils away from the patch. After 24 hours you may shower with the patch on. Take brief showers with your back facing the shower head.  Do not remove patch once it has been placed because that will interrupt data and decrease adhesive wear time. Push the button when you have any symptoms and write down what you were feeling. Once you have completed  wearing your monitor, remove and place into box which has postage paid and place in your outgoing mailbox.  If for some reason you have misplaced your box then call our office and we can provide another box and/or mail it off for you.      Follow-Up: At [facility], you and your health needs are our priority.  As part of our continuing mission to provide you with exceptional heart care, we have created designated Provider Care Teams.  These Care Teams include your primary Cardiologist (physician) and Advanced Practice Providers (APPs -  Physician Assistants and Nurse Practitioners) who all work together to provide you with the care you need, when you need it.  We recommend signing up for the patient portal called "MyChart".  Sign up information is provided on this After Visit Summary.  MyChart is used to connect with patients for Virtual Visits (Telemedicine).  Patients are able to view lab/test results, encounter notes, upcoming appointments, etc.  Non-urgent messages can be sent to your provider as well.   To learn more about what you can do with MyChart, go to [email or web address].    Your next appointment:   1 year(s)  The format for your next appointment:   In Person  Provider:   Buford Dresser, MD{     Wilhemina Bonito as a scribe for Buford Dresser, MD.,have documented all relevant documentation on the behalf of Buford Dresser, MD,as directed by  Buford Dresser, MD while in the presence of Buford Dresser, MD.  I, Buford Dresser, MD, have reviewed all documentation for this visit. The documentation on 10/21/21 for the exam, diagnosis, procedures, and orders are all accurate and complete.   Signed, Buford Dresser, MD PhD 10/21/2021  [place]

## 2021-10-19 NOTE — Patient Instructions (Signed)
Medication Instructions:  Your Physician recommend you continue on your current medication as directed.    *If you need a refill on your cardiac medications before your next appointment, please call your pharmacy*   Lab Work: None ordered today   Testing/Procedures: Your physician has recommended that you wear a 14 day Zio monitor.   This monitor is a medical device that records the heart's electrical activity. Doctors most often use these monitors to diagnose arrhythmias. Arrhythmias are problems with the speed or rhythm of the heartbeat. The monitor is a small device applied to your chest. You can wear one while you do your normal daily activities. While wearing this monitor if you have any symptoms to push the button and record what you felt. Once you have worn this monitor for the period of time provider prescribed (Usually 14 days), you will return the monitor device in the postage paid box. Once it is returned they will download the data collected and provide us with a report which the provider will then review and we will call you with those results. Important tips:  Avoid showering during the first 24 hours of wearing the monitor. Avoid excessive sweating to help maximize wear time. Do not submerge the device, no hot tubs, and no swimming pools. Keep any lotions or oils away from the patch. After 24 hours you may shower with the patch on. Take brief showers with your back facing the shower head.  Do not remove patch once it has been placed because that will interrupt data and decrease adhesive wear time. Push the button when you have any symptoms and write down what you were feeling. Once you have completed wearing your monitor, remove and place into box which has postage paid and place in your outgoing mailbox.  If for some reason you have misplaced your box then call our office and we can provide another box and/or mail it off for you.      Follow-Up: At [facility], you and  your health needs are our priority.  As part of our continuing mission to provide you with exceptional heart care, we have created designated Provider Care Teams.  These Care Teams include your primary Cardiologist (physician) and Advanced Practice Providers (APPs -  Physician Assistants and Nurse Practitioners) who all work together to provide you with the care you need, when you need it.  We recommend signing up for the patient portal called "MyChart".  Sign up information is provided on this After Visit Summary.  MyChart is used to connect with patients for Virtual Visits (Telemedicine).  Patients are able to view lab/test results, encounter notes, upcoming appointments, etc.  Non-urgent messages can be sent to your provider as well.   To learn more about what you can do with MyChart, go to [email or web address].    Your next appointment:   1 year(s)  The format for your next appointment:   In Person  Provider:   Bridgette Christopher, MD{        

## 2021-11-02 ENCOUNTER — Ambulatory Visit: Payer: PPO | Admitting: Family Medicine

## 2021-11-02 DIAGNOSIS — J301 Allergic rhinitis due to pollen: Secondary | ICD-10-CM | POA: Diagnosis not present

## 2021-11-02 DIAGNOSIS — J3089 Other allergic rhinitis: Secondary | ICD-10-CM | POA: Diagnosis not present

## 2021-11-02 DIAGNOSIS — J3081 Allergic rhinitis due to animal (cat) (dog) hair and dander: Secondary | ICD-10-CM | POA: Diagnosis not present

## 2021-11-08 DIAGNOSIS — R002 Palpitations: Secondary | ICD-10-CM | POA: Diagnosis not present

## 2021-11-08 DIAGNOSIS — R55 Syncope and collapse: Secondary | ICD-10-CM | POA: Diagnosis not present

## 2021-11-11 ENCOUNTER — Encounter (HOSPITAL_BASED_OUTPATIENT_CLINIC_OR_DEPARTMENT_OTHER): Payer: Self-pay

## 2021-11-16 NOTE — Progress Notes (Addendum)
?Stanley Bennett D.O. ?[facility] ?[place] ?Phone: [phone] ?Subjective:   ?I, Stanley Bennett, am serving as a scribe for Dr. Hulan Saas. ? ?This visit occurred during the SARS-CoV-2 public health emergency.  Safety protocols were in place, including screening questions prior to the visit, additional usage of staff PPE, and extensive cleaning of exam room while observing appropriate contact time as indicated for disinfecting solutions.  ? ?I'm seeing this patient by the request  of:  Koberlein, Steele Berg, MD ? ?CC: right hip pain  ? ?[id number]  ?10/06/2021 ?Do believe the patient is having more impingement of the right hip.  On x-ray patient does have spurring of the lateral aspect. ?Discussed icing regimen and home exercises, we discussed which activities to do and which ones to avoid, increase activity slowly over the course the next several weeks.  Follow-up with me again in 6 to 8 weeks.  Worsening pain would like to consider potential injections for diagnostic purposes including possibly intra-articular versus the possibility of a lateral hip injection.  Differential also includes lumbar radiculopathy but left knee is less likely with the x-rays. ? ?Update 11/17/2021 ?Stanley Bennett is a 66 y.o. male coming in with complaint of L hamstring and R hip pain. Patient states that his hip has improved. Pain starts when he walks about 1.5 miles.  ? ?Tennis ball stretch has helped hamstring. Patient notes pain when he does not stretch. Also using lumbar pillow for driving which has helped.  ? ? ?  ? ?Past Medical History:  ?Diagnosis Date  ? Allergy   ? seasonal allergies  ? Arthritis   ? generalized  ? Basal cell carcinoma   ? scalp  ? GERD (gastroesophageal reflux disease)   ? on meds  ? Gout   ? on gout  ? Hyperlipidemia   ? on meds  ? ?Past Surgical History:  ?Procedure Laterality Date  ? APPENDECTOMY    ? BASAL CELL CARCINOMA EXCISION    ? CERVICAL DISC SURGERY     ? COLONOSCOPY  05/2017  ? MAC-suprep(exc)-polyps  ? TONSILLECTOMY    ? UPPER GASTROINTESTINAL ENDOSCOPY  05/2017  ? normal  ? VASECTOMY    ? WISDOM TOOTH EXTRACTION    ? ?Social History  ? ?Socioeconomic History  ? Marital status: Married  ?  Spouse name: Stanley Bennett  ? Number of children: 3  ? Years of education: Not on file  ? Highest education level: Not on file  ?Occupational History  ? Occupation: Development worker, international aid  ?  Comment: retired  ?Tobacco Use  ? Smoking status: Never  ? Smokeless tobacco: Never  ?Vaping Use  ? Vaping Use: Never used  ?Substance and Sexual Activity  ? Alcohol use: Yes  ?  Alcohol/week: 5.0 standard drinks  ?  Types: 5 Standard drinks or equivalent per week  ?  Comment: 5  ? Drug use: No  ? Sexual activity: Yes  ?  Partners: Female  ?Other Topics Concern  ? Not on file  ?Social History Narrative  ? Work or School: [employer], 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  ?   ? Home Situation: lives with wife  ?   ? Spiritual Beliefs: Catholic  ?   ? Lifestyle: regular exercise and healthy diet  ? ?Social Determinants of Health  ? ?Financial Resource Strain: Low Risk   ? Difficulty of Paying Living Expenses: Not hard at all  ?Food Insecurity: No Food Insecurity  ? Worried  About Running Out of Food in the Last Year: Never true  ? Ran Out of Food in the Last Year: Never true  ?Transportation Needs: No Transportation Needs  ? Lack of Transportation (Medical): No  ? Lack of Transportation (Non-Medical): No  ?Physical Activity: Sufficiently Active  ? Days of Exercise per Week: 6 days  ? Minutes of Exercise per Session: 90 min  ?Stress: No Stress Concern Present  ? Feeling of Stress : Not at all  ?Social Connections: Not on file  ? ?Allergies  ?Allergen Reactions  ? Teriflunomide Other (See Comments)  ? Tamiflu [Oseltamivir] Rash  ?  Mild rash 2018  ? ?Family History  ?Problem Relation Age of Onset  ? Arthritis Mother   ?     osteo  ? Arthritis Father   ?     osteo  ? Heart disease Father   ?      (pneumonia cause of death 81 yrs)  ? Prostate cancer Father   ? Diabetes Sister   ? Ovarian cancer Sister   ? Other Brother   ?     celiac sprue  ? Prostate cancer Brother 69  ? Kidney failure Cousin   ?     pat cousin  ? Kidney failure Cousin   ?     pat cousin  ? Colon cancer Neg Hx   ? Esophageal cancer Neg Hx   ? Rectal cancer Neg Hx   ? Colon polyps Neg Hx   ? Stomach cancer Neg Hx   ? ? ? ?Current Outpatient Medications (Cardiovascular):  ?  EPINEPHrine 0.3 mg/0.3 mL IJ SOAJ injection, SMARTSIG:Injection As Directed ?  rosuvastatin (CRESTOR) 20 MG tablet, TAKE 1 TABLET(20 MG) BY MOUTH DAILY ?  tadalafil (CIALIS) 5 MG tablet, Take 1 tablet (5 mg total) by mouth daily. ?  metoprolol succinate (TOPROL XL) 25 MG 24 hr tablet, Take 0.5 tablets (12.5 mg total) by mouth daily. ?  nitroGLYCERIN (NITROSTAT) 0.4 MG SL tablet, Place 1 tablet (0.4 mg total) under the tongue every 5 (five) minutes as needed for chest pain. Do not use within 48 ours of using cialis. ? ?Current Outpatient Medications (Respiratory):  ?  fluticasone (FLONASE) 50 MCG/ACT nasal spray, Place 1 spray into both nostrils 2 (two) times daily.  ?  loratadine (CLARITIN) 10 MG tablet, Take 10 mg by mouth daily. ? ?Current Outpatient Medications (Analgesics):  ?  allopurinol (ZYLOPRIM) 300 MG tablet, TAKE 1 TABLET(300 MG) BY MOUTH DAILY AS DIRECTED ?  aspirin EC 81 MG tablet, Take 1 tablet (81 mg total) by mouth daily. ? ? ?Current Outpatient Medications (Other):  ?  pantoprazole (PROTONIX) 20 MG tablet, TAKE 1 TABLET(20 MG) BY MOUTH DAILY BEFORE AND BREAKFAST ?  PRESCRIPTION MEDICATION, Allergy shots every other week ?  Vitamin D, Ergocalciferol, (DRISDOL) 1.25 MG (50000 UNIT) CAPS capsule, Take 1 capsule (50,000 Units total) by mouth every 7 (seven) days. ? ? ?Reviewed prior external information including notes and imaging from  ?primary care provider ?As well as notes that were available from care everywhere and other healthcare systems. ? ?Past  medical history, social, surgical and family history all reviewed in electronic medical record.  No pertanent information unless stated regarding to the chief complaint.  ? ?Review of Systems: ? No headache, visual changes, nausea, vomiting, diarrhea, constipation, dizziness, abdominal pain, skin rash, fevers, chills, night sweats, weight loss, swollen lymph nodes, body aches, joint swelling, chest pain, shortness of breath, mood changes. POSITIVE muscle aches ? ?  Objective  ?Blood pressure 108/64, pulse 73, height _0  (1.753 m), weight 171 lb (77.6 kg), SpO2 97 %. ?  ?General: No apparent distress alert and oriented x3 mood and affect normal, dressed appropriately.  ?HEENT: Pupils equal, extraocular movements intact  ?Respiratory: Patient's speak in full sentences and does not appear short of breath  ?Cardiovascular: No lower extremity edema, non tender, no erythema  ?Gait normal with good balance and coordination.  ?MSK:  Non tender with full range of motion and good stability and symmetric strength and tone of shoulders, elbows, wrist, hip, knee and ankles bilaterally.  ? ?  ?Impression and Recommendations:  ?  ? ?The above documentation has been reviewed and is accurate and complete Lyndal Pulley, DO ? ? ? ?

## 2021-11-17 ENCOUNTER — Ambulatory Visit: Payer: Self-pay

## 2021-11-17 ENCOUNTER — Ambulatory Visit: Payer: PPO | Admitting: Family Medicine

## 2021-11-17 VITALS — BP 108/64 | HR 73 | Ht 69.0 in | Wt 171.0 lb

## 2021-11-17 DIAGNOSIS — M25551 Pain in right hip: Secondary | ICD-10-CM

## 2021-11-17 DIAGNOSIS — J3089 Other allergic rhinitis: Secondary | ICD-10-CM | POA: Diagnosis not present

## 2021-11-17 DIAGNOSIS — G5702 Lesion of sciatic nerve, left lower limb: Secondary | ICD-10-CM | POA: Diagnosis not present

## 2021-11-17 DIAGNOSIS — M25851 Other specified joint disorders, right hip: Secondary | ICD-10-CM | POA: Diagnosis not present

## 2021-11-17 DIAGNOSIS — J301 Allergic rhinitis due to pollen: Secondary | ICD-10-CM | POA: Diagnosis not present

## 2021-11-17 DIAGNOSIS — J3081 Allergic rhinitis due to animal (cat) (dog) hair and dander: Secondary | ICD-10-CM | POA: Diagnosis not present

## 2021-11-17 NOTE — Patient Instructions (Signed)
Good to see you  ?We will look into shock wave ?No news means you are not a candidate ?Otherwise keep doing what you are doing ?Keep the tennis ball with you  ?See me again in 3 months ?

## 2021-11-17 NOTE — Assessment & Plan Note (Addendum)
Patient does have the anterior lateral bone spur.  We discussed the possibility of shockwave therapy.  Patient wants to hold on that at the moment.  Discussed icing regimen and home exercises, core strengthening.  Did discuss with patient likely will need a hip replacement at some point in the future.  Patient will remain active and see me again in 3 months. Total time discussing the above including shockwave therapy greater than 18 minutes  ?

## 2021-11-17 NOTE — Assessment & Plan Note (Signed)
Continues to have symptoms more of the hamstring and the piriformis.  Discussed the possibility of injection but at the moment patient wants to continue with conservative therapy.  Also could be a candidate for other further work-up including MRIs but at the moment this is not stopping patient except for sitting for long durations.  Patient will increase activity slowly and follow-up with me again in 3 months ?

## 2021-11-23 MED ORDER — METOPROLOL SUCCINATE ER 25 MG PO TB24
12.5000 mg | ORAL_TABLET | Freq: Every day | ORAL | 2 refills | Status: DC
Start: 2021-11-23 — End: 2022-07-11

## 2021-11-26 DIAGNOSIS — J3081 Allergic rhinitis due to animal (cat) (dog) hair and dander: Secondary | ICD-10-CM | POA: Diagnosis not present

## 2021-11-26 DIAGNOSIS — J3089 Other allergic rhinitis: Secondary | ICD-10-CM | POA: Diagnosis not present

## 2021-11-26 DIAGNOSIS — J301 Allergic rhinitis due to pollen: Secondary | ICD-10-CM | POA: Diagnosis not present

## 2021-12-02 ENCOUNTER — Encounter (HOSPITAL_BASED_OUTPATIENT_CLINIC_OR_DEPARTMENT_OTHER): Payer: Self-pay

## 2021-12-09 DIAGNOSIS — J3081 Allergic rhinitis due to animal (cat) (dog) hair and dander: Secondary | ICD-10-CM | POA: Diagnosis not present

## 2021-12-09 DIAGNOSIS — J301 Allergic rhinitis due to pollen: Secondary | ICD-10-CM | POA: Diagnosis not present

## 2021-12-09 DIAGNOSIS — J3089 Other allergic rhinitis: Secondary | ICD-10-CM | POA: Diagnosis not present

## 2021-12-16 ENCOUNTER — Encounter (HOSPITAL_BASED_OUTPATIENT_CLINIC_OR_DEPARTMENT_OTHER): Payer: Self-pay | Admitting: Cardiology

## 2021-12-16 ENCOUNTER — Ambulatory Visit (HOSPITAL_BASED_OUTPATIENT_CLINIC_OR_DEPARTMENT_OTHER): Payer: PPO | Admitting: Cardiology

## 2021-12-16 VITALS — BP 90/60 | HR 60 | Ht 69.5 in | Wt 166.6 lb

## 2021-12-16 DIAGNOSIS — I493 Ventricular premature depolarization: Secondary | ICD-10-CM

## 2021-12-16 DIAGNOSIS — R5383 Other fatigue: Secondary | ICD-10-CM

## 2021-12-16 DIAGNOSIS — I2510 Atherosclerotic heart disease of native coronary artery without angina pectoris: Secondary | ICD-10-CM | POA: Diagnosis not present

## 2021-12-16 DIAGNOSIS — D649 Anemia, unspecified: Secondary | ICD-10-CM

## 2021-12-16 DIAGNOSIS — E7800 Pure hypercholesterolemia, unspecified: Secondary | ICD-10-CM | POA: Diagnosis not present

## 2021-12-16 DIAGNOSIS — I959 Hypotension, unspecified: Secondary | ICD-10-CM | POA: Diagnosis not present

## 2021-12-16 DIAGNOSIS — R002 Palpitations: Secondary | ICD-10-CM

## 2021-12-16 NOTE — Progress Notes (Signed)
?Cardiology Office Note:   ? ?Date:  12/16/2021  ? ?ID:  Stanley Bennett, DOB Dec 23, 1955, MRN [id number] ? ?PCP:  Caren Macadam, MD  ?Cardiologist:  Buford Dresser, MD ? ?Referring MD: Caren Macadam, MD  ? ?CC: follow up ? ?History of Present Illness:   ? ?Stanley Bennett is a 66 y.o. male with a hx of hyperlipidemia, family history of heart disease who is seen for follow up. I initially met him 10/29/19 as a new consult at the request of Koberlein, Junell C, MD for the evaluation and management of chest discomfort and variable heart rate. ? ?At his last appointment he reported episodes of flutters for several weeks prior. This occurred primarily at night lasting for up to several hours. In 08/2021 he had an acute decreased heart rate to the 30's. He was sitting in a chair when his heart rate dropped. It elevated back to normal after about 15 minutes. He reported he was sick that week. He had a syncopal episode the previous Saturday that he associated with dehydration. He was standing up when he suddenly felt dizzy and woke up on the ground. He felt better after drinking water. He had occasional episodes of positional dizziness starting several weeks prior. Generally he was active and walking 6 miles a day. ? ?Today: ?Since his last visit he messaged the office with concerns for possible side effects of his new metoprolol including headache, chills, diarrhea, dizziness, and fatigue. His headaches, fatigue, and dizziness began 1 week prior. His fatigue seemed to be worsening as he needed to take several naps in the day. It was recommended he follow up with his PCP if his symptoms did not resolve as the symptoms were not typically consistent with a medication reaction to Metoprolol. ? ?He also presents today to discuss his heart monitor results, which we reviewed at length. Lately his palpitations have not been occurring as frequently as they did in January. Prior to 08/2021 he was not aware of  his flutters. ? ?He reports his blood pressure has been running low since starting his metoprolol. He has noticed some dizziness. ? ?Typically he is an active person. For exercise he walks frequently. However, he has been feeling significantly fatigued lately. He will usually nap in the afternoons. ? ?He endorses bleeding easily, especially when working outside in the yard. No hematuria or hematochezia. ? ?He denies any chest pain, shortness of breath, or peripheral edema. No headaches, syncope, orthopnea, or PND.  ? ? ?Past Medical History:  ?Diagnosis Date  ? Allergy   ? seasonal allergies  ? Arthritis   ? generalized  ? Basal cell carcinoma   ? scalp  ? GERD (gastroesophageal reflux disease)   ? on meds  ? Gout   ? on gout  ? Hyperlipidemia   ? on meds  ? ? ?Past Surgical History:  ?Procedure Laterality Date  ? APPENDECTOMY    ? BASAL CELL CARCINOMA EXCISION    ? CERVICAL DISC SURGERY    ? COLONOSCOPY  05/2017  ? MAC-suprep(exc)-polyps  ? TONSILLECTOMY    ? UPPER GASTROINTESTINAL ENDOSCOPY  05/2017  ? normal  ? VASECTOMY    ? WISDOM TOOTH EXTRACTION    ? ? ?Current Medications: ?Current Outpatient Medications on File Prior to Visit  ?Medication Sig  ? allopurinol (ZYLOPRIM) 300 MG tablet TAKE 1 TABLET(300 MG) BY MOUTH DAILY AS DIRECTED  ? aspirin EC 81 MG tablet Take 1 tablet (81 mg total) by mouth daily.  ?  EPINEPHrine 0.3 mg/0.3 mL IJ SOAJ injection SMARTSIG:Injection As Directed  ? fluticasone (FLONASE) 50 MCG/ACT nasal spray Place 1 spray into both nostrils 2 (two) times daily.   ? loratadine (CLARITIN) 10 MG tablet Take 10 mg by mouth daily.  ? metoprolol succinate (TOPROL XL) 25 MG 24 hr tablet Take 0.5 tablets (12.5 mg total) by mouth daily.  ? nitroGLYCERIN (NITROSTAT) 0.4 MG SL tablet Place 1 tablet (0.4 mg total) under the tongue every 5 (five) minutes as needed for chest pain. Do not use within 48 ours of using cialis.  ? pantoprazole (PROTONIX) 20 MG tablet TAKE 1 TABLET(20 MG) BY MOUTH DAILY BEFORE  AND BREAKFAST  ? PRESCRIPTION MEDICATION Allergy shots every other week  ? rosuvastatin (CRESTOR) 20 MG tablet TAKE 1 TABLET(20 MG) BY MOUTH DAILY  ? tadalafil (CIALIS) 5 MG tablet Take 1 tablet (5 mg total) by mouth daily.  ? Vitamin D, Ergocalciferol, (DRISDOL) 1.25 MG (50000 UNIT) CAPS capsule Take 1 capsule (50,000 Units total) by mouth every 7 (seven) days.  ? ?No current facility-administered medications on file prior to visit.  ?  ? ?Allergies:   Teriflunomide and Tamiflu [oseltamivir]  ? ?Social History  ? ?Tobacco Use  ? Smoking status: Never  ? Smokeless tobacco: Never  ?Vaping Use  ? Vaping Use: Never used  ?Substance Use Topics  ? Alcohol use: Yes  ?  Alcohol/week: 5.0 standard drinks  ?  Types: 5 Standard drinks or equivalent per week  ?  Comment: 5  ? Drug use: No  ? ? ?Family History: ?family history includes Arthritis in his father and mother; Diabetes in his sister; Heart disease in his father; Kidney failure in his cousin and cousin; Other in his brother; Ovarian cancer in his sister; Prostate cancer in his father; Prostate cancer (age of onset: 57) in his brother. There is no history of Colon cancer, Esophageal cancer, Rectal cancer, Colon polyps, or Stomach cancer.  father had CABG at 34 years old. Sister just had a stent placed in her late 57s. They were both less active than he is. ? ?ROS:   ?Please see the history of present illness.   ?(+) Palpitations ?(+) Dizziness ?(+) Fatigue ?Additional pertinent ROS otherwise unremarkable. ? ?EKGs/Labs/Other Studies Reviewed:   ? ?The following studies were reviewed today: ? ?Monitor 12/18/19 ?14 days of data recorded on Zio monitor. Patient had a min HR of 39 bpm, max HR of 148 bpm, and avg HR of 58 bpm. Predominant underlying rhythm was Sinus Rhythm. No VT, atrial fibrillation, high degree block, or pauses noted. Isolated atrial ectopy was rare (<1%), and isolated ventricular ectopy was occasional (2.6%). There were 5 triggered events, which were sinus  with/without PVCs. 4 Supraventricular Tachycardia runs occurred, the run with the fastest interval lasting 5 beats with a max rate of 148 bpm, the longest lasting 9 beats with an avg rate of 107 bpm. ? ?CT FFR 12/04/19 ?FINDINGS: ?FFRct analysis was performed on the original cardiac CT angiogram ?dataset. Diagrammatic representation of the FFRct analysis is ?provided in a separate PDF document in PACS. This dictation was ?created using the PDF document and an interactive 3D model of the ?results. 3D model is not available in the EMR/PACS. Normal FFR range ?is >0.80. ?  ?1. Left Main:  No significant stenosis. FFR = 0.98 ?  ?2. LAD: No significant stenosis. Proximal FFR = 0.95, Mid FFR =0.89, ?Distal FFR = 0.84 ?3. LCX: No significant stenosis. Proximal FFR = 0.96, Distal FFR = ?  0.94 ?4. RCA: No significant stenosis. Proximal FFR = 0.98, Mid FFR =0.95, ?Distal FFR = 0.92 ?  ?IMPRESSION: ?1.  CT FFR analysis did not show any significant stenosis. ? ?Cardiac CT 12/03/19 ?IMPRESSION: ?1. Mild nonobstructive CAD, CADRADS = 2. However, there is one ?lesion in the mid LAD that cannot be fully visualized due to calcium ?and may have higher degree stenosis. CT FFR will be performed and ?reported separately. ?  ?2. Coronary calcium score of 395. This was 79th percentile for age ?and sex matched control. ?  ?3. Normal coronary origin with right dominance. ? ?FFR negative for significant stenosis. ? ? ?EKG:  EKG was personally reviewed. ?12/16/2021: EKG was not ordered. ?10/19/21: Sinus bradycardia, rate 54 bpm ?10/29/19: sinus bradycardia at 53 bpm ? ?Recent Labs: ?03/01/2021: TSH 3.78 ?07/25/2021: ALT 29; BUN 15; Creatinine, Ser 0.98; Hemoglobin 12.9; Magnesium 2.0; Platelets 280; Potassium 3.3; Sodium 138  ? ?Recent Lipid Panel ?   ?Component Value Date/Time  ? CHOL 176 03/01/2021 0853  ? TRIG 47.0 03/01/2021 0853  ? HDL 85.50 03/01/2021 0853  ? CHOLHDL 2 03/01/2021 0853  ? VLDL 9.4 03/01/2021 0853  ? [place] 81 03/01/2021 0853   ? ? ?Physical Exam:   ? ?VS:  BP 90/60 (BP Location: Left Arm, Patient Position: Sitting, Cuff Size: Normal)   Pulse 60   Ht 5' 9.5" (1.765 m)   Wt 166 lb 9.6 oz (75.6 kg)   SpO2 98%   BMI 24.25 kg/m?    ?

## 2021-12-16 NOTE — Patient Instructions (Addendum)
Medication Instructions:  ?Your physician recommends that you continue on your current medications as directed. Please refer to the Current Medication list given to you today.  ? ?*If you need a refill on your cardiac medications before your next appointment, please call your pharmacy* ? ?Lab Work: ?TODAY UPSTAIRS STE [address]  ? ?If you have labs (blood work) drawn today and your tests are completely normal, you will receive your results only by: ?MyChart Message (if you have MyChart) OR ?A paper copy in the mail ?If you have any lab test that is abnormal or we need to change your treatment, we will call you to review the results. ? ?Testing/Procedures: ?Your physician has requested that you have an echocardiogram. Echocardiography is a painless test that uses sound waves to create images of your heart. It provides your doctor with information about the size and shape of your heart and how well your heart?s chambers and valves are working. This procedure takes approximately one hour. There are no restrictions for this procedure. ? ?Follow-Up: ?At [facility], you and your health needs are our priority.  As part of our continuing mission to provide you with exceptional heart care, we have created designated Provider Care Teams.  These Care Teams include your primary Cardiologist (physician) and Advanced Practice Providers (APPs -  Physician Assistants and Nurse Practitioners) who all work together to provide you with the care you need, when you need it. ? ?We recommend signing up for the patient portal called "MyChart".  Sign up information is provided on this After Visit Summary.  MyChart is used to connect with patients for Virtual Visits (Telemedicine).  Patients are able to view lab/test results, encounter notes, upcoming appointments, etc.  Non-urgent messages can be sent to your provider as well.   ?To learn more about what you can do with MyChart, go to [email or web address].   ? ?Your next appointment:   ?3  week(s) ? ?The format for your next appointment:   ?In Person ? ?Provider:   ?Buford Dresser, MD{ ? ? ? ? ?

## 2021-12-17 DIAGNOSIS — J301 Allergic rhinitis due to pollen: Secondary | ICD-10-CM | POA: Diagnosis not present

## 2021-12-17 DIAGNOSIS — J3089 Other allergic rhinitis: Secondary | ICD-10-CM | POA: Diagnosis not present

## 2021-12-17 DIAGNOSIS — J3081 Allergic rhinitis due to animal (cat) (dog) hair and dander: Secondary | ICD-10-CM | POA: Diagnosis not present

## 2021-12-17 LAB — THYROID PANEL WITH TSH
Free Thyroxine Index: 2.1 (ref 1.2–4.9)
T3 Uptake Ratio: 29 % (ref 24–39)
T4, Total: 7.1 ug/dL (ref 4.5–12.0)
TSH: 4.21 u[IU]/mL (ref 0.450–4.500)

## 2021-12-17 LAB — COMPREHENSIVE METABOLIC PANEL
ALT: 36 IU/L (ref 0–44)
AST: 28 IU/L (ref 0–40)
Albumin/Globulin Ratio: 1.4 (ref 1.2–2.2)
Albumin: 4 g/dL (ref 3.8–4.8)
Alkaline Phosphatase: 66 IU/L (ref 44–121)
BUN/Creatinine Ratio: 28 — ABNORMAL HIGH (ref 10–24)
BUN: 25 mg/dL (ref 8–27)
Bilirubin Total: 0.4 mg/dL (ref 0.0–1.2)
CO2: 23 mmol/L (ref 20–29)
Calcium: 8.8 mg/dL (ref 8.6–10.2)
Chloride: 102 mmol/L (ref 96–106)
Creatinine, Ser: 0.9 mg/dL (ref 0.76–1.27)
Globulin, Total: 2.8 g/dL (ref 1.5–4.5)
Glucose: 87 mg/dL (ref 70–99)
Potassium: 5 mmol/L (ref 3.5–5.2)
Sodium: 139 mmol/L (ref 134–144)
Total Protein: 6.8 g/dL (ref 6.0–8.5)
eGFR: 94 mL/min/{1.73_m2} (ref >59)

## 2021-12-17 LAB — IRON AND TIBC
Iron Saturation: 5 % — CL (ref 15–55)
Iron: 17 ug/dL — ABNORMAL LOW (ref 38–169)
Total Iron Binding Capacity: 366 ug/dL (ref 250–450)
UIBC: 349 ug/dL — ABNORMAL HIGH (ref 111–343)

## 2021-12-17 LAB — CBC WITH DIFFERENTIAL/PLATELET
Basophils Absolute: 0.1 10*3/uL (ref 0.0–0.2)
Basos: 1 %
EOS (ABSOLUTE): 0.1 10*3/uL (ref 0.0–0.4)
Eos: 1 %
Hematocrit: 37.7 % (ref 37.5–51.0)
Hemoglobin: 11.7 g/dL — ABNORMAL LOW (ref 13.0–17.7)
Immature Grans (Abs): 0 10*3/uL (ref 0.0–0.1)
Immature Granulocytes: 0 %
Lymphocytes Absolute: 1.3 10*3/uL (ref 0.7–3.1)
Lymphs: 18 %
MCH: 23.6 pg — ABNORMAL LOW (ref 26.6–33.0)
MCHC: 31 g/dL — ABNORMAL LOW (ref 31.5–35.7)
MCV: 76 fL — ABNORMAL LOW (ref 79–97)
Monocytes Absolute: 0.6 10*3/uL (ref 0.1–0.9)
Monocytes: 8 %
Neutrophils Absolute: 5.3 10*3/uL (ref 1.4–7.0)
Neutrophils: 72 %
Platelets: 414 10*3/uL (ref 150–450)
RBC: 4.96 x10E6/uL (ref 4.14–5.80)
RDW: 16.1 % — ABNORMAL HIGH (ref 11.6–15.4)
WBC: 7.4 10*3/uL (ref 3.4–10.8)

## 2021-12-17 LAB — RETICULOCYTES: Retic Ct Pct: 0.8 % (ref 0.6–2.6)

## 2021-12-17 LAB — FERRITIN: Ferritin: 12 ng/mL — ABNORMAL LOW (ref 30–400)

## 2021-12-22 ENCOUNTER — Other Ambulatory Visit: Payer: Self-pay | Admitting: Family Medicine

## 2021-12-24 ENCOUNTER — Encounter: Payer: Self-pay | Admitting: Family Medicine

## 2021-12-24 ENCOUNTER — Ambulatory Visit (INDEPENDENT_AMBULATORY_CARE_PROVIDER_SITE_OTHER): Payer: PPO | Admitting: Family Medicine

## 2021-12-24 VITALS — BP 126/76 | HR 56 | Temp 98.2°F | Ht 69.5 in | Wt 166.5 lb

## 2021-12-24 DIAGNOSIS — R5383 Other fatigue: Secondary | ICD-10-CM

## 2021-12-24 DIAGNOSIS — R7989 Other specified abnormal findings of blood chemistry: Secondary | ICD-10-CM | POA: Diagnosis not present

## 2021-12-24 DIAGNOSIS — D509 Iron deficiency anemia, unspecified: Secondary | ICD-10-CM

## 2021-12-24 DIAGNOSIS — E538 Deficiency of other specified B group vitamins: Secondary | ICD-10-CM | POA: Diagnosis not present

## 2021-12-24 DIAGNOSIS — E559 Vitamin D deficiency, unspecified: Secondary | ICD-10-CM

## 2021-12-24 NOTE — Progress Notes (Signed)
?Stanley Bennett ?DOB: 29-Jun-1956 ?Encounter date: 12/24/2021 ? ?This is a 66 y.o. male who presents with ?Chief Complaint  ?Patient presents with  ? Follow-up  ? ? ?History of present illness: ?He had blood work done 12/16/2021 through cardiology and was anemic at that time.  Hemoglobin was 11.7.  Prior hemoglobin was 15.  MCH and MCV were both low.  Iron stores were low (ferritin 12).  Last colonoscopy was 08/25/2020.  He had 1 polyp at that time (tubular adenoma) and was told to follow-up for repeat in 5 years time.  Urinalysis done on 07/25/2021 was normal. ? ?Has been extremely fatigued for awhile. Initially thought that medication he was put on was causing fatigue, but then after bloodwork now wondering what is going on.  ? ?He went to donate blood and they wouldn't take blood due to anemia or hypotension. Bp was down to 80'ssystolic at cardiology last week. Has been in 80-90's at home.  ? ?Donates blood about every 8 weeks and has done this since he was in college. Gives at least 3-4 times/year. Has had a low iron finger stick in past, but then other finger stick was ok.  ? ?Does state that stools have been soft for the past month. Have been darker than normal. Bleeding easier and bruising easier; now noting this more.  ? ?Toprol helps with palpitations, but has decreased bp.  ? ?Taking protonix daily. Reflux flares on and off, but not particularly worse at this point.  ? ?Sleep - easy to fall asleep; wakes easier and hard to fall back asleep. Does feel that this is worse than baseline although always an issue.  ? ?When standing up or doing something strenuous; getting light headed feeling with coming back up. Also had episode of walking in march at daughters house outside and passed out. Did feel it coming.  ? ?Does get some headaches; not bad, but noting more frequently.  ? ?2 weeks ago was at the beach and had chills, loose stools for about 4 days. No one else in family got sick.  ? ? ?Allergies   ?Allergen Reactions  ? Teriflunomide Other (See Comments)  ? Tamiflu [Oseltamivir] Rash  ?  Mild rash 2018  ? ?Current Meds  ?Medication Sig  ? allopurinol (ZYLOPRIM) 300 MG tablet TAKE 1 TABLET(300 MG) BY MOUTH DAILY AS DIRECTED  ? aspirin EC 81 MG tablet Take 1 tablet (81 mg total) by mouth daily.  ? EPINEPHrine 0.3 mg/0.3 mL IJ SOAJ injection SMARTSIG:Injection As Directed  ? fluticasone (FLONASE) 50 MCG/ACT nasal spray Place 1 spray into both nostrils 2 (two) times daily.   ? loratadine (CLARITIN) 10 MG tablet Take 10 mg by mouth daily.  ? metoprolol succinate (TOPROL XL) 25 MG 24 hr tablet Take 0.5 tablets (12.5 mg total) by mouth daily.  ? pantoprazole (PROTONIX) 20 MG tablet TAKE ONE TABLET BY MOUTH DAILY BEFORE BREAKFAST  ? PRESCRIPTION MEDICATION Allergy shots every other week  ? rosuvastatin (CRESTOR) 20 MG tablet TAKE 1 TABLET(20 MG) BY MOUTH DAILY  ? tadalafil (CIALIS) 5 MG tablet Take 1 tablet (5 mg total) by mouth daily.  ? Vitamin D, Ergocalciferol, (DRISDOL) 1.25 MG (50000 UNIT) CAPS capsule Take 1 capsule (50,000 Units total) by mouth every 7 (seven) days.  ? ? ?Review of Systems  ?Constitutional:  Positive for fatigue. Negative for activity change, appetite change, chills (single episode assoc with diarrhea, see hpi), fever and unexpected weight change.  ?HENT:  Negative for congestion, ear  pain, hearing loss, sinus pressure, sinus pain, sore throat and trouble swallowing.   ?Eyes:  Negative for pain and visual disturbance.  ?Respiratory:  Negative for cough, chest tightness, shortness of breath and wheezing.   ?Cardiovascular:  Negative for chest pain, palpitations and leg swelling.  ?Gastrointestinal:  Negative for abdominal distention, abdominal pain, blood in stool (no blood, but stools have been darker), constipation, diarrhea, nausea and vomiting.  ?Genitourinary:  Negative for decreased urine volume, difficulty urinating, dysuria, penile pain and testicular pain.  ?Musculoskeletal:   Negative for arthralgias, back pain and joint swelling.  ?Skin:  Negative for rash.  ?Neurological:  Negative for dizziness, weakness, numbness and headaches.  ?Hematological:  Negative for adenopathy. Does not bruise/bleed easily.  ?Psychiatric/Behavioral:  Negative for agitation, sleep disturbance and suicidal ideas. The patient is not nervous/anxious.   ? ?Objective: ? ?BP 126/76   Pulse (!) 56   Temp 98.2 ?F (36.8 ?C) (Oral)   Ht 5' 9.5" (1.765 m)   Wt 166 lb 8 oz (75.5 kg)   SpO2 99%   BMI 24.24 kg/m?   Weight: 166 lb 8 oz (75.5 kg)  ? ?BP Readings from Last 3 Encounters:  ?12/24/21 126/76  ?12/16/21 90/60  ?11/17/21 108/64  ? ?Wt Readings from Last 3 Encounters:  ?12/24/21 166 lb 8 oz (75.5 kg)  ?12/16/21 166 lb 9.6 oz (75.6 kg)  ?11/17/21 171 lb (77.6 kg)  ? ? ?Physical Exam ?Constitutional:   ?   General: He is not in acute distress. ?   Appearance: He is well-developed.  ?HENT:  ?   Head: Normocephalic and atraumatic.  ?   Right Ear: External ear normal.  ?   Left Ear: External ear normal.  ?   Nose: Nose normal.  ?   Mouth/Throat:  ?   Pharynx: No oropharyngeal exudate.  ?Eyes:  ?   Conjunctiva/sclera: Conjunctivae normal.  ?   Pupils: Pupils are equal, round, and reactive to light.  ?Neck:  ?   Thyroid: No thyromegaly.  ?Cardiovascular:  ?   Rate and Rhythm: Normal rate and regular rhythm.  ?   Heart sounds: Normal heart sounds. No murmur heard. ?  No friction rub. No gallop.  ?Pulmonary:  ?   Effort: Pulmonary effort is normal. No respiratory distress.  ?   Breath sounds: Normal breath sounds. No stridor. No wheezing or rales.  ?Abdominal:  ?   General: Abdomen is flat. Bowel sounds are normal.  ?   Palpations: Abdomen is soft.  ?   Tenderness: There is no abdominal tenderness.  ?Musculoskeletal:     ?   General: Normal range of motion.  ?   Cervical back: Neck supple.  ?Lymphadenopathy:  ?   Head:  ?   Right side of head: No submental or submandibular adenopathy.  ?   Left side of head: No  submental or submandibular adenopathy.  ?   Cervical: No cervical adenopathy.  ?   Upper Body:  ?   Right upper body: No supraclavicular, axillary or epitrochlear adenopathy.  ?   Left upper body: No supraclavicular, axillary or epitrochlear adenopathy.  ?   Lower Body: No right inguinal adenopathy. No left inguinal adenopathy.  ?Skin: ?   General: Skin is warm and dry.  ?Neurological:  ?   Mental Status: He is alert and oriented to person, place, and time.  ?Psychiatric:     ?   Behavior: Behavior normal.     ?   Thought Content:  Thought content normal.     ?   Judgment: Judgment normal.  ? ? ?Assessment/Plan ? ?1. Iron deficiency anemia, unspecified iron deficiency anemia type ?Uncertain source of anemia, but he is iron deficient so we need to further evaluate to see where he is losing blood.  I am concerned with change in stools and frequency of stooling, although attempted fecal occult in the office today was negative, but there was not much stool on sample.  We will get some additional blood work and have him do a home stool testing.  If positive, he will need to be referred to GI for further evaluation. ?- Fecal Occult Blood, Guaiac; Future ?- Urinalysis; Future ? ?2. Fatigue, unspecified type ?Starting with some additional evaluation.  Fatigue started prior to him being started on beta-blocker. ?- DG Chest 2 View; Future ?- Mononucleosis Test, Qual W/ Reflex; Future ?- Antistreptolysin O titer; Future ? ?3. Vitamin D deficiency ?He was previously deficient and was prescribed 50,000 units once weekly. ? ?4. Low serum vitamin B12 ?- Folate; Future ?- Vitamin B12; Future ? ? ?Return for establish care w dr. Jerilee Hoh in about 2-3 weeks. ? ?8mnutes spent in chart review, exam, discussion with patient, charting, follow up plan discussion ? ? ? ?JMicheline Rough MD ?

## 2021-12-27 ENCOUNTER — Ambulatory Visit (INDEPENDENT_AMBULATORY_CARE_PROVIDER_SITE_OTHER): Payer: PPO

## 2021-12-27 ENCOUNTER — Other Ambulatory Visit (INDEPENDENT_AMBULATORY_CARE_PROVIDER_SITE_OTHER): Payer: PPO

## 2021-12-27 DIAGNOSIS — R5383 Other fatigue: Secondary | ICD-10-CM

## 2021-12-27 DIAGNOSIS — E538 Deficiency of other specified B group vitamins: Secondary | ICD-10-CM

## 2021-12-27 DIAGNOSIS — R7989 Other specified abnormal findings of blood chemistry: Secondary | ICD-10-CM | POA: Diagnosis not present

## 2021-12-27 DIAGNOSIS — I493 Ventricular premature depolarization: Secondary | ICD-10-CM | POA: Diagnosis not present

## 2021-12-27 DIAGNOSIS — D509 Iron deficiency anemia, unspecified: Secondary | ICD-10-CM

## 2021-12-27 DIAGNOSIS — R55 Syncope and collapse: Secondary | ICD-10-CM | POA: Diagnosis not present

## 2021-12-27 LAB — URINALYSIS
Bilirubin Urine: NEGATIVE
Hgb urine dipstick: NEGATIVE
Ketones, ur: NEGATIVE
Leukocytes,Ua: NEGATIVE
Nitrite: NEGATIVE
Specific Gravity, Urine: <=1.005 — AB (ref 1.000–1.030)
Total Protein, Urine: NEGATIVE
Urine Glucose: NEGATIVE
Urobilinogen, UA: 0.2 (ref 0.0–1.0)
pH: 6.5 (ref 5.0–8.0)

## 2021-12-27 LAB — ECHOCARDIOGRAM COMPLETE
AR max vel: 2.97 cm2
AV Area VTI: 3.32 cm2
AV Area mean vel: 2.93 cm2
AV Mean grad: 4 mmHg
AV Peak grad: 8 mmHg
Ao pk vel: 1.42 m/s
Area-P 1/2: 3.05 cm2
Calc EF: 65.5 %
S' Lateral: 3.1 cm
Single Plane A2C EF: 66.6 %
Single Plane A4C EF: 65.5 %

## 2021-12-27 LAB — VITAMIN D 25 HYDROXY (VIT D DEFICIENCY, FRACTURES): VITD: 25.86 ng/mL — ABNORMAL LOW (ref 30.00–100.00)

## 2021-12-27 LAB — VITAMIN B12: Vitamin B-12: 733 pg/mL (ref 211–911)

## 2021-12-27 LAB — FOLATE: Folate: 14.2 ng/mL (ref >5.9)

## 2021-12-28 ENCOUNTER — Other Ambulatory Visit (INDEPENDENT_AMBULATORY_CARE_PROVIDER_SITE_OTHER): Payer: PPO

## 2021-12-28 ENCOUNTER — Encounter: Payer: Self-pay | Admitting: Family Medicine

## 2021-12-28 DIAGNOSIS — D509 Iron deficiency anemia, unspecified: Secondary | ICD-10-CM

## 2021-12-28 LAB — ANTISTREPTOLYSIN O TITER: ASO: 219 IU/mL — ABNORMAL HIGH (ref 0.0–200.0)

## 2021-12-28 LAB — MONO QUAL W/RFLX QN: Mono Qual W/Rflx Qn: NEGATIVE

## 2021-12-28 LAB — FECAL OCCULT BLOOD, IMMUNOCHEMICAL: Fecal Occult Bld: NEGATIVE

## 2021-12-29 ENCOUNTER — Other Ambulatory Visit: Payer: Self-pay | Admitting: Family Medicine

## 2021-12-29 DIAGNOSIS — D509 Iron deficiency anemia, unspecified: Secondary | ICD-10-CM

## 2021-12-29 MED ORDER — AMOXICILLIN 500 MG PO CAPS: 1000.0000 mg | ORAL_CAPSULE | Freq: Every day | ORAL | 0 refills | Status: AC

## 2022-01-04 ENCOUNTER — Encounter (HOSPITAL_BASED_OUTPATIENT_CLINIC_OR_DEPARTMENT_OTHER): Payer: Self-pay | Admitting: Cardiology

## 2022-01-04 ENCOUNTER — Ambulatory Visit (HOSPITAL_BASED_OUTPATIENT_CLINIC_OR_DEPARTMENT_OTHER): Payer: PPO | Admitting: Cardiology

## 2022-01-04 VITALS — BP 113/66 | HR 56 | Ht 69.5 in | Wt 167.0 lb

## 2022-01-04 DIAGNOSIS — I493 Ventricular premature depolarization: Secondary | ICD-10-CM

## 2022-01-04 DIAGNOSIS — D509 Iron deficiency anemia, unspecified: Secondary | ICD-10-CM

## 2022-01-04 DIAGNOSIS — I2510 Atherosclerotic heart disease of native coronary artery without angina pectoris: Secondary | ICD-10-CM | POA: Diagnosis not present

## 2022-01-04 DIAGNOSIS — R5383 Other fatigue: Secondary | ICD-10-CM | POA: Diagnosis not present

## 2022-01-04 DIAGNOSIS — R002 Palpitations: Secondary | ICD-10-CM | POA: Diagnosis not present

## 2022-01-04 DIAGNOSIS — E7800 Pure hypercholesterolemia, unspecified: Secondary | ICD-10-CM | POA: Diagnosis not present

## 2022-01-04 NOTE — Patient Instructions (Signed)
Medication Instructions:  Your Physician recommend you continue on your current medication as directed.    *If you need a refill on your cardiac medications before your next appointment, please call your pharmacy*   Lab Work: None ordered today   Testing/Procedures: None ordered today   Follow-Up: At [facility], you and your health needs are our priority.  As part of our continuing mission to provide you with exceptional heart care, we have created designated Provider Care Teams.  These Care Teams include your primary Cardiologist (physician) and Advanced Practice Providers (APPs -  Physician Assistants and Nurse Practitioners) who all work together to provide you with the care you need, when you need it.  We recommend signing up for the patient portal called "MyChart".  Sign up information is provided on this After Visit Summary.  MyChart is used to connect with patients for Virtual Visits (Telemedicine).  Patients are able to view lab/test results, encounter notes, upcoming appointments, etc.  Non-urgent messages can be sent to your provider as well.   To learn more about what you can do with MyChart, go to [email or web address].    Your next appointment:   6 month(s)  The format for your next appointment:   In Person  Provider:   Buford Dresser, MD{   [employer]: Website: [email or web address]  DR. Harrell Gave RECOMMENDS YOU PURCHASE  " [employer]" [employer] FROM THE  [employer]  APP PLAY STORE.  THE APP IS FREE , BUT THE  EQUIPMENT HAS A COST. IT ALLOWS YOU TO OBTAIN A RECORDING OF YOUR HEART RATE AND RHYTHM BY PROVIDING A SHORT STRIP THAT YOU CAN SHARE WITH YOUR PROVIDER.     Important Information About Sugar

## 2022-01-04 NOTE — Progress Notes (Signed)
Cardiology Office Note:    Date:  01/04/2022   ID:  Stanley, [name] Bennett 22, 1957, MRN [id number]  PCP:  Caren Macadam, MD  Cardiologist:  Buford Dresser, MD  Referring MD: Caren Macadam, MD   CC: follow up  History of Present Illness:    Stanley Bennett is a 66 y.o. male with a hx of hyperlipidemia, family history of heart disease who is seen for follow up. I initially met him 10/29/19 as a new consult at the request of Koberlein, Junell C, MD for the evaluation and management of chest discomfort and variable heart rate.  At his last appointment he reported episodes of flutters for several weeks prior. This occurred primarily at night lasting for up to several hours. In 08/2021 he had an acute decreased heart rate to the 30's. He was sitting in a chair when his heart rate dropped. It elevated back to normal after about 15 minutes. He reported he was sick that week. He had a syncopal episode the previous Saturday that he associated with dehydration. He was standing up when he suddenly felt dizzy and woke up on the ground. He felt better after drinking water. He had occasional episodes of positional dizziness starting several weeks prior. Generally he was active and walking 6 miles a day.  Today: Has seen Dr. Ethlyn Gallery for iron deficiency anemia, workup in progress. He has follow up scheduled with Dr. Jerilee Hoh.  He is here with his wife today. His blood pressure is better, and he is able to walk 4 miles/day (down from 6 miles/day) and lift weights for 30 min/session (down from 60 min/session).   We reviewed his echo results in depth. Also discussed his evaluation for iron deficiency anemia. His wife is understandably frustrated that the evaluation will take time.   No gross blood in stools. He continues to experience loose stools as he has for several months. He is now taking iron supplements.   He continues to have palpitations, most notable at rest or when he  is trying to sleep. Discussed PVCs at length today.   He does bruise easily.  Denies chest pain, shortness of breath at rest or with normal exertion. No PND, orthopnea, LE edema or unexpected weight gain. No syncope.  Past Medical History:  Diagnosis Date   Allergy    seasonal allergies   Arthritis    generalized   Basal cell carcinoma    scalp   GERD (gastroesophageal reflux disease)    on meds   Gout    on gout   Hyperlipidemia    on meds    Past Surgical History:  Procedure Laterality Date   APPENDECTOMY     BASAL CELL CARCINOMA EXCISION     CERVICAL DISC SURGERY     COLONOSCOPY  05/2017   MAC-suprep(exc)-polyps   TONSILLECTOMY     UPPER GASTROINTESTINAL ENDOSCOPY  05/2017   normal   VASECTOMY     WISDOM TOOTH EXTRACTION      Current Medications: Current Outpatient Medications on File Prior to Visit  Medication Sig   allopurinol (ZYLOPRIM) 300 MG tablet TAKE 1 TABLET(300 MG) BY MOUTH DAILY AS DIRECTED   amoxicillin (AMOXIL) 500 MG capsule Take 2 capsules (1,000 mg total) by mouth daily for 10 days.   aspirin EC 81 MG tablet Take 1 tablet (81 mg total) by mouth daily.   EPINEPHrine 0.3 mg/0.3 mL IJ SOAJ injection SMARTSIG:Injection As Directed   fluticasone (FLONASE) 50 MCG/ACT nasal spray Place 1  spray into both nostrils 2 (two) times daily.    loratadine (CLARITIN) 10 MG tablet Take 10 mg by mouth daily.   metoprolol succinate (TOPROL XL) 25 MG 24 hr tablet Take 0.5 tablets (12.5 mg total) by mouth daily.   nitroGLYCERIN (NITROSTAT) 0.4 MG SL tablet Place 1 tablet (0.4 mg total) under the tongue every 5 (five) minutes as needed for chest pain. Do not use within 48 ours of using cialis.   pantoprazole (PROTONIX) 20 MG tablet TAKE ONE TABLET BY MOUTH DAILY BEFORE BREAKFAST   PRESCRIPTION MEDICATION Allergy shots every other week   rosuvastatin (CRESTOR) 20 MG tablet TAKE 1 TABLET(20 MG) BY MOUTH DAILY   tadalafil (CIALIS) 5 MG tablet Take 1 tablet (5 mg total) by  mouth daily.   Vitamin D, Ergocalciferol, (DRISDOL) 1.25 MG (50000 UNIT) CAPS capsule Take 1 capsule (50,000 Units total) by mouth every 7 (seven) days.   No current facility-administered medications on file prior to visit.     Allergies:   Teriflunomide and Tamiflu [oseltamivir]   Social History   Tobacco Use   Smoking status: Never   Smokeless tobacco: Never  Vaping Use   Vaping Use: Never used  Substance Use Topics   Alcohol use: Yes    Alcohol/week: 5.0 standard drinks    Types: 5 Standard drinks or equivalent per week    Comment: 5   Drug use: No    Family History: family history includes Arthritis in his father and mother; Diabetes in his sister; Heart disease in his father; Kidney failure in his cousin and cousin; Other in his brother; Ovarian cancer in his sister; Prostate cancer in his father; Prostate cancer (age of onset: 67) in his brother. There is no history of Colon cancer, Esophageal cancer, Rectal cancer, Colon polyps, or Stomach cancer.  father had CABG at 16 years old. Sister just had a stent placed in her late 67s. They were both less active than he is.  ROS:   Please see the history of present illness.   Additional pertinent ROS otherwise unremarkable.  EKGs/Labs/Other Studies Reviewed:    The following studies were reviewed today:  Monitor 12/18/19 14 days of data recorded on Zio monitor. Patient had a min HR of 39 bpm, max HR of 148 bpm, and avg HR of 58 bpm. Predominant underlying rhythm was Sinus Rhythm. No VT, atrial fibrillation, high degree block, or pauses noted. Isolated atrial ectopy was rare (<1%), and isolated ventricular ectopy was occasional (2.6%). There were 5 triggered events, which were sinus with/without PVCs. 4 Supraventricular Tachycardia runs occurred, the run with the fastest interval lasting 5 beats with a max rate of 148 bpm, the longest lasting 9 beats with an avg rate of 107 bpm.  CT FFR 12/04/19 FINDINGS: FFRct analysis was  performed on the original cardiac CT angiogram dataset. Diagrammatic representation of the FFRct analysis is provided in a separate PDF document in PACS. This dictation was created using the PDF document and an interactive 3D model of the results. 3D model is not available in the EMR/PACS. Normal FFR range is >0.80.   1. Left Main:  No significant stenosis. FFR = 0.98   2. LAD: No significant stenosis. Proximal FFR = 0.95, Mid FFR =0.89, Distal FFR = 0.84 3. LCX: No significant stenosis. Proximal FFR = 0.96, Distal FFR = 0.94 4. RCA: No significant stenosis. Proximal FFR = 0.98, Mid FFR =0.95, Distal FFR = 0.92   IMPRESSION: 1.  CT FFR analysis did not  show any significant stenosis.  Cardiac CT 12/03/19 IMPRESSION: 1. Mild nonobstructive CAD, CADRADS = 2. However, there is one lesion in the mid LAD that cannot be fully visualized due to calcium and may have higher degree stenosis. CT FFR will be performed and reported separately.   2. Coronary calcium score of 395. This was 79th percentile for age and sex matched control.   3. Normal coronary origin with right dominance.  FFR negative for significant stenosis.   EKG:  EKG was personally reviewed. 12/16/2021: EKG was not ordered. 10/19/21: Sinus bradycardia, rate 54 bpm 10/29/19: sinus bradycardia at 53 bpm  Recent Labs: 07/25/2021: Magnesium 2.0 12/16/2021: ALT 36; BUN 25; Creatinine, Ser 0.90; Hemoglobin 11.7; Platelets 414; Potassium 5.0; Sodium 139; TSH 4.210   Recent Lipid Panel    Component Value Date/Time   CHOL 176 03/01/2021 0853   TRIG 47.0 03/01/2021 0853   HDL 85.50 03/01/2021 0853   CHOLHDL 2 03/01/2021 0853   VLDL 9.4 03/01/2021 0853   LDLCALC 81 03/01/2021 0853    Physical Exam:    VS:  BP 113/66 (BP Location: Left Arm, Patient Position: Sitting, Cuff Size: Normal)   Pulse (!) 56   Ht 5' 9.5" (1.765 m)   Wt 167 lb (75.8 kg)   SpO2 99%   BMI 24.31 kg/m     Wt Readings from Last 3 Encounters:   01/04/22 167 lb (75.8 kg)  12/24/21 166 lb 8 oz (75.5 kg)  12/16/21 166 lb 9.6 oz (75.6 kg)    GEN: Well nourished, well developed in no acute distress HEENT: Normal, moist mucous membranes NECK: No JVD CARDIAC: regular rhythm, normal S1 and S2, no rubs or gallops. No murmur. VASCULAR: Radial and DP pulses 2+ bilaterally. No carotid bruits RESPIRATORY:  Clear to auscultation without rales, wheezing or rhonchi  ABDOMEN: Soft, non-tender, non-distended MUSCULOSKELETAL:  Ambulates independently SKIN: Warm and dry, no edema NEUROLOGIC:  Alert and oriented x 3. No focal neuro deficits noted. PSYCHIATRIC:  Normal affect    ASSESSMENT:    1. Fatigue, unspecified type   2. Heart palpitations   3. PVC (premature ventricular contraction)   4. Nonocclusive coronary atherosclerosis of native coronary artery   5. Pure hypercholesterolemia   6. Iron deficiency anemia, unspecified iron deficiency anemia type     PLAN:    Anemia, iron deficiency of unclear etiology Fatigue -has been seen by Dr. Ethlyn Gallery, started on iron supplements. Has follow up pending with Dr. Jerilee Hoh -no cardiac etiology for this based on echo  Palpitations PVCs, frequent History of Syncope -PVC burden higher on most recent monitor, >10%. Now on metoprolol low dose -echo with atrial enlargement, otherwise unremarkable -discussed [place] today, he will look into it  Nonobstructive CAD with prior chest pain:  -no further symptoms -tolerating aspirin 81 mg daily -tolerating rosuvastatin 20 mg daily -counseled on red flag warning signs that need immediate medical attention   Hypercholesterolemia: -tolerating rosuvastatin 20 mg daily -lipids 02/19/20: Tchol 157, HDL 68, LDL 75, TG 65 -lipids 03/01/21: Tchol 176, HDL 85, LDL 81, TG 47 -Goal LDL <70, but with concerns above will hold on making changes at this time   Family history of heart disease: secondary prevention as below  Cardiac risk counseling and  prevention recommendations: -recommend heart healthy/Mediterranean diet, with whole grains, fruits, vegetable, fish, lean meats, nuts, and olive oil. Limit salt. -recommend moderate walking, 3-5 times/week for 30-50 minutes each session. Aim for at least 150 minutes.week. Goal should be pace of  3 miles/hours, or walking 1.5 miles in 30 minutes -recommend avoidance of tobacco products. Avoid excess alcohol.  Plan for follow up: 6 mos or sooner as needed  Total time of encounter: 48 minutes total time of encounter, including 37 minutes spent in face-to-face patient care. This time includes coordination of care and counseling regarding test results, cardiac summary, anemia discussion. Remainder of non-face-to-face time involved reviewing chart documents/testing relevant to the patient encounter and documentation in the medical record.  Buford Dresser, MD, PhD, [facility]    Medication Adjustments/Labs and Tests Ordered: Current medicines are reviewed at length with the patient today.  Concerns regarding medicines are outlined above.   No orders of the defined types were placed in this encounter.  No orders of the defined types were placed in this encounter.  Patient Instructions  Medication Instructions:  Your Physician recommend you continue on your current medication as directed.    *If you need a refill on your cardiac medications before your next appointment, please call your pharmacy*   Lab Work: None ordered today   Testing/Procedures: None ordered today   Follow-Up: At [facility], you and your health needs are our priority.  As part of our continuing mission to provide you with exceptional heart care, we have created designated Provider Care Teams.  These Care Teams include your primary Cardiologist (physician) and Advanced Practice Providers (APPs -  Physician Assistants and Nurse Practitioners) who all work together to provide you with the  care you need, when you need it.  We recommend signing up for the patient portal called "MyChart".  Sign up information is provided on this After Visit Summary.  MyChart is used to connect with patients for Virtual Visits (Telemedicine).  Patients are able to view lab/test results, encounter notes, upcoming appointments, etc.  Non-urgent messages can be sent to your provider as well.   To learn more about what you can do with MyChart, go to [email or web address].    Your next appointment:   6 month(s)  The format for your next appointment:   In Person  Provider:   Buford Dresser, MD{   K[employer]ia [employer]: Website: [email or web address]  DR. Harrell Gave RECOMMENDS YOU PURCHASE  " Kardia" [employer] FROM THE  [employer]  APP PLAY STORE.  THE APP IS FREE , BUT THE  EQUIPMENT HAS A COST. IT ALLOWS YOU TO OBTAIN A RECORDING OF YOUR HEART RATE AND RHYTHM BY PROVIDING A SHORT STRIP THAT YOU CAN SHARE WITH YOUR PROVIDER.     Important Information About Sugar        Signed, Buford Dresser, MD PhD 01/04/2022  [facility]

## 2022-01-05 DIAGNOSIS — J301 Allergic rhinitis due to pollen: Secondary | ICD-10-CM | POA: Diagnosis not present

## 2022-01-05 DIAGNOSIS — J3089 Other allergic rhinitis: Secondary | ICD-10-CM | POA: Diagnosis not present

## 2022-01-05 DIAGNOSIS — J3081 Allergic rhinitis due to animal (cat) (dog) hair and dander: Secondary | ICD-10-CM | POA: Diagnosis not present

## 2022-01-06 DIAGNOSIS — L2089 Other atopic dermatitis: Secondary | ICD-10-CM | POA: Diagnosis not present

## 2022-01-06 DIAGNOSIS — J301 Allergic rhinitis due to pollen: Secondary | ICD-10-CM | POA: Diagnosis not present

## 2022-01-06 DIAGNOSIS — J3081 Allergic rhinitis due to animal (cat) (dog) hair and dander: Secondary | ICD-10-CM | POA: Diagnosis not present

## 2022-01-06 DIAGNOSIS — J3089 Other allergic rhinitis: Secondary | ICD-10-CM | POA: Diagnosis not present

## 2022-01-08 ENCOUNTER — Other Ambulatory Visit (HOSPITAL_BASED_OUTPATIENT_CLINIC_OR_DEPARTMENT_OTHER): Payer: Self-pay | Admitting: Cardiology

## 2022-01-08 DIAGNOSIS — E7800 Pure hypercholesterolemia, unspecified: Secondary | ICD-10-CM

## 2022-01-11 DIAGNOSIS — J3081 Allergic rhinitis due to animal (cat) (dog) hair and dander: Secondary | ICD-10-CM | POA: Diagnosis not present

## 2022-01-11 DIAGNOSIS — J301 Allergic rhinitis due to pollen: Secondary | ICD-10-CM | POA: Diagnosis not present

## 2022-01-11 DIAGNOSIS — J3089 Other allergic rhinitis: Secondary | ICD-10-CM | POA: Diagnosis not present

## 2022-01-11 NOTE — Telephone Encounter (Signed)
Rx(s) sent to pharmacy electronically.  

## 2022-01-12 ENCOUNTER — Other Ambulatory Visit (INDEPENDENT_AMBULATORY_CARE_PROVIDER_SITE_OTHER): Payer: PPO

## 2022-01-12 DIAGNOSIS — D509 Iron deficiency anemia, unspecified: Secondary | ICD-10-CM

## 2022-01-12 LAB — CBC WITH DIFFERENTIAL/PLATELET
Basophils Absolute: 0 10*3/uL (ref 0.0–0.1)
Basophils Relative: 0.5 % (ref 0.0–3.0)
Eosinophils Absolute: 0.1 10*3/uL (ref 0.0–0.7)
Eosinophils Relative: 2.3 % (ref 0.0–5.0)
HCT: 37.3 % — ABNORMAL LOW (ref 39.0–52.0)
Hemoglobin: 11.8 g/dL — ABNORMAL LOW (ref 13.0–17.0)
Lymphocytes Relative: 25.7 % (ref 12.0–46.0)
Lymphs Abs: 1.1 10*3/uL (ref 0.7–4.0)
MCHC: 31.6 g/dL (ref 30.0–36.0)
MCV: 77 fl — ABNORMAL LOW (ref 78.0–100.0)
Monocytes Absolute: 0.4 10*3/uL (ref 0.1–1.0)
Monocytes Relative: 8.6 % (ref 3.0–12.0)
Neutro Abs: 2.8 10*3/uL (ref 1.4–7.7)
Neutrophils Relative %: 62.9 % (ref 43.0–77.0)
Platelets: 212 10*3/uL (ref 150.0–400.0)
RBC: 4.84 Mil/uL (ref 4.22–5.81)
RDW: 23 % — ABNORMAL HIGH (ref 11.5–15.5)
WBC: 4.4 10*3/uL (ref 4.0–10.5)

## 2022-01-13 ENCOUNTER — Encounter: Payer: Self-pay | Admitting: Internal Medicine

## 2022-01-18 ENCOUNTER — Encounter: Payer: Self-pay | Admitting: Internal Medicine

## 2022-01-18 ENCOUNTER — Encounter (HOSPITAL_BASED_OUTPATIENT_CLINIC_OR_DEPARTMENT_OTHER): Payer: Self-pay

## 2022-01-18 ENCOUNTER — Ambulatory Visit (INDEPENDENT_AMBULATORY_CARE_PROVIDER_SITE_OTHER): Payer: PPO | Admitting: Internal Medicine

## 2022-01-18 VITALS — BP 110/68 | HR 61 | Temp 97.7°F | Ht 69.0 in | Wt 166.6 lb

## 2022-01-18 DIAGNOSIS — R6889 Other general symptoms and signs: Secondary | ICD-10-CM | POA: Diagnosis not present

## 2022-01-18 DIAGNOSIS — D509 Iron deficiency anemia, unspecified: Secondary | ICD-10-CM

## 2022-01-18 DIAGNOSIS — R5383 Other fatigue: Secondary | ICD-10-CM | POA: Diagnosis not present

## 2022-01-18 DIAGNOSIS — J301 Allergic rhinitis due to pollen: Secondary | ICD-10-CM | POA: Diagnosis not present

## 2022-01-18 DIAGNOSIS — J3081 Allergic rhinitis due to animal (cat) (dog) hair and dander: Secondary | ICD-10-CM | POA: Diagnosis not present

## 2022-01-18 DIAGNOSIS — J3089 Other allergic rhinitis: Secondary | ICD-10-CM | POA: Diagnosis not present

## 2022-01-18 DIAGNOSIS — I493 Ventricular premature depolarization: Secondary | ICD-10-CM

## 2022-01-18 NOTE — Telephone Encounter (Signed)
Please advise 

## 2022-01-18 NOTE — Progress Notes (Signed)
New Patient Office Visit     CC/Reason for Visit: Establish care, discuss acute concerns Previous PCP: Micheline Rough, MD  Last Visit: May 2023  HPI: Stanley Bennett is a 66 y.o. male who is coming in today for the above mentioned reasons.  He had been in relatively good health until the beginning of the year.  At that time he started noticing palpitations.  He followed with Dr. Harrell Gave, his cardiologist.  He was found to have a large PVC burden on monitor and around middle to late March was started on low-dose metoprolol 12.5 mg twice daily.  Around the same time he was noted to have iron deficiency anemia.  His latest hemoglobin is 11.8.  He had a UA without hematuria, fecal occult blood testing that was negative.  He also had a colonoscopy in January 2022 with only adenomatous polyps and 5-year follow-up was recommended.  He routinely has donated blood throughout his life for as long as he can remember about 3-4 times a year.  He is here with his wife today.  His wife notes significant change in quality of life with extreme fatigue and increased sleepiness.  Patient states that he used to be a great exerciser.  He would normally walk 5 to 6 miles a day.  He cannot do this anymore due to extreme fatigue.  His exercise tolerance has decreased significantly.  He also describes what appear to be orthostatic symptoms with dizziness and lightheadedness when standing up all of a sudden.  All of these symptoms started around March.  He had an echocardiogram on Dec 27, 2021 that showed an ejection fraction of 65 to 70% with no wall motion abnormalities and grade 1 diastolic dysfunction.  His blood pressure in office today is 110/68 with a heart rate of 61.  Past Medical/Surgical History: Past Medical History:  Diagnosis Date   Allergy    seasonal allergies   Arthritis    generalized   Basal cell carcinoma    scalp   GERD (gastroesophageal reflux disease)    on meds   Gout    on gout    Hyperlipidemia    on meds    Past Surgical History:  Procedure Laterality Date   APPENDECTOMY     BASAL CELL CARCINOMA EXCISION     CERVICAL DISC SURGERY     COLONOSCOPY  05/2017   MAC-suprep(exc)-polyps   TONSILLECTOMY     UPPER GASTROINTESTINAL ENDOSCOPY  05/2017   normal   VASECTOMY     WISDOM TOOTH EXTRACTION      Social History:  reports that he has never smoked. He has never used smokeless tobacco. He reports current alcohol use of about 5.0 standard drinks per week. He reports that he does not use drugs.  Allergies: Allergies  Allergen Reactions   Teriflunomide Other (See Comments)   Tamiflu [Oseltamivir] Rash    Mild rash 2018    Family History:  Family History  Problem Relation Age of Onset   Arthritis Mother        osteo   Arthritis Father        osteo   Heart disease Father        (pneumonia cause of death 36 yrs)   Prostate cancer Father    Diabetes Sister    Ovarian cancer Sister    Other Brother        celiac sprue   Prostate cancer Brother 1   Kidney failure Cousin  pat cousin   Kidney failure Cousin        pat cousin   Colon cancer Neg Hx    Esophageal cancer Neg Hx    Rectal cancer Neg Hx    Colon polyps Neg Hx    Stomach cancer Neg Hx      Current Outpatient Medications:    allopurinol (ZYLOPRIM) 300 MG tablet, TAKE 1 TABLET(300 MG) BY MOUTH DAILY AS DIRECTED, Disp: 90 tablet, Rfl: 1   aspirin EC 81 MG tablet, Take 1 tablet (81 mg total) by mouth daily., Disp: 90 tablet, Rfl: 3   EPINEPHrine 0.3 mg/0.3 mL IJ SOAJ injection, SMARTSIG:Injection As Directed, Disp: , Rfl:    fluticasone (FLONASE) 50 MCG/ACT nasal spray, Place 1 spray into both nostrils 2 (two) times daily. , Disp: , Rfl:    loratadine (CLARITIN) 10 MG tablet, Take 10 mg by mouth daily., Disp: , Rfl:    metoprolol succinate (TOPROL XL) 25 MG 24 hr tablet, Take 0.5 tablets (12.5 mg total) by mouth daily., Disp: 15 tablet, Rfl: 2   pantoprazole (PROTONIX) 20 MG  tablet, TAKE ONE TABLET BY MOUTH DAILY BEFORE BREAKFAST, Disp: 90 tablet, Rfl: 0   PRESCRIPTION MEDICATION, Allergy shots every other week, Disp: , Rfl:    rosuvastatin (CRESTOR) 20 MG tablet, TAKE ONE TABLET BY MOUTH DAILY, Disp: 90 tablet, Rfl: 2   tadalafil (CIALIS) 5 MG tablet, Take 1 tablet (5 mg total) by mouth daily., Disp: 90 tablet, Rfl: 3   Vitamin D, Ergocalciferol, (DRISDOL) 1.25 MG (50000 UNIT) CAPS capsule, Take 1 capsule (50,000 Units total) by mouth every 7 (seven) days., Disp: 12 capsule, Rfl: 0   nitroGLYCERIN (NITROSTAT) 0.4 MG SL tablet, Place 1 tablet (0.4 mg total) under the tongue every 5 (five) minutes as needed for chest pain. Do not use within 48 ours of using cialis., Disp: 90 tablet, Rfl: 3  Review of Systems:  Constitutional: Denies fever, chills, diaphoresis, appetite change.  HEENT: Denies photophobia, eye pain, redness, hearing loss, ear pain, congestion, sore throat, rhinorrhea, sneezing, mouth sores, trouble swallowing, neck pain, neck stiffness and tinnitus.   Respiratory: Denies SOB, DOE, cough, chest tightness,  and wheezing.   Cardiovascular: Denies chest pain and leg swelling.  Gastrointestinal: Denies nausea, vomiting, abdominal pain, constipation, blood in stool and abdominal distention.  Genitourinary: Denies dysuria, urgency, frequency, hematuria, flank pain and difficulty urinating.  Endocrine: Denies: hot or cold intolerance, sweats, changes in hair or nails, polyuria, polydipsia. Musculoskeletal: Denies myalgias, back pain, joint swelling, arthralgias and gait problem.  Skin: Denies pallor, rash and wound.  Neurological: Denies  seizures,  weakness, numbness and headaches.  Hematological: Denies adenopathy. Easy bruising, personal or family bleeding history  Psychiatric/Behavioral: Denies suicidal ideation, mood changes, confusion, nervousness, sleep disturbance and agitation    Physical Exam: Vitals:   01/18/22 1322  BP: 110/68  Pulse: 61   Temp: 97.7 F (36.5 C)  TempSrc: Oral  SpO2: 99%  Weight: 166 lb 9.6 oz (75.6 kg)  Height: _0  (1.753 m)   Body mass index is 24.6 kg/m.   Constitutional: NAD, calm, comfortable Eyes: PERRL, lids and conjunctivae normal, wears corrective lenses ENMT: Mucous membranes are moist. Posterior pharynx clear of any exudate or lesions. Normal dentition. Tympanic membrane is pearly white, no erythema or bulging. Neck: normal, supple, no masses, no thyromegaly Respiratory: clear to auscultation bilaterally, no wheezing, no crackles. Normal respiratory effort. No accessory muscle use.  Cardiovascular: Regular rate and rhythm, no murmurs / rubs /  gallops. No extremity edema.  Neurologic: Grossly intact and nonfocal Psychiatric: Normal judgment and insight. Alert and oriented x 3. Normal mood.    Impression and Plan:  Iron deficiency anemia, unspecified iron deficiency anemia type  Frequent PVCs  Fatigue, unspecified type  Exercise intolerance  -He will commence ferrous sulfate 325 mg twice daily. -He has had a fairly good work-up for investigation of blood loss sources including recent UA and fecal occult blood testing that have been negative.  This coupled with a relatively normal colonoscopy just 18 months ago is fairly reassuring. -I do believe that likely his longtime history of blood donation is the cause of his iron deficiency probably coupled with decreased dietary intake. -As long as he tolerates oral iron I do not believe we need to consider an iron infusion. -We will recheck blood counts and iron studies in 3 months. -I have explained to patient and his wife that it is extremely unlikely that his iron deficiency anemia with a hemoglobin of 11.8 is to blame for his extreme fatigue and exercise intolerance. -I think it is likely related to the beta-blocker as his symptoms started at around the same time. -I think it is reasonable to attempt a trial of discontinuing beta-blocker  to see if fatigue and exercise intolerance improve prior to undertaking an extensive work-up.  I have asked them to reach out to Dr. Harrell Gave prior to doing this. -If discontinuing beta-blocker does not improve his symptoms, we can consider other reasons for fatigue such as vitamin D deficiency, hypothyroidism or obstructive sleep apnea.    Time Spent: 35 minutes reviewing chart, interviewing and examining patient and formulating plan of care.    Lelon Frohlich, MD [facility]

## 2022-01-26 ENCOUNTER — Encounter: Payer: PPO | Admitting: Internal Medicine

## 2022-01-26 DIAGNOSIS — J3089 Other allergic rhinitis: Secondary | ICD-10-CM | POA: Diagnosis not present

## 2022-01-26 DIAGNOSIS — J301 Allergic rhinitis due to pollen: Secondary | ICD-10-CM | POA: Diagnosis not present

## 2022-01-26 DIAGNOSIS — J3081 Allergic rhinitis due to animal (cat) (dog) hair and dander: Secondary | ICD-10-CM | POA: Diagnosis not present

## 2022-02-04 DIAGNOSIS — J3081 Allergic rhinitis due to animal (cat) (dog) hair and dander: Secondary | ICD-10-CM | POA: Diagnosis not present

## 2022-02-04 DIAGNOSIS — J301 Allergic rhinitis due to pollen: Secondary | ICD-10-CM | POA: Diagnosis not present

## 2022-02-04 DIAGNOSIS — J3089 Other allergic rhinitis: Secondary | ICD-10-CM | POA: Diagnosis not present

## 2022-02-11 NOTE — Telephone Encounter (Signed)
Reasonable to remain off Metoprolol. So long as not noticing any significant PVC's (palpitations, heart racing, etc) can defer additional medication at this time. Dr. Harrell Gave had previously recommended he purchase Kardia for home monitoring. If he is noting significant palpitations, could add Diltiazem '120mg'$  QD.   Stanley Dubonnet, NP

## 2022-02-11 NOTE — Telephone Encounter (Signed)
Dr. Harrell Gave patient, can you advise

## 2022-02-14 MED ORDER — DILTIAZEM HCL ER COATED BEADS 120 MG PO CP24: 120.0000 mg | ORAL_CAPSULE | Freq: Every day | ORAL | 3 refills | Status: DC

## 2022-02-18 ENCOUNTER — Other Ambulatory Visit: Payer: PPO

## 2022-02-28 NOTE — Progress Notes (Signed)
Stanley Bennett [address] [place] [address] Phone: [phone] Subjective:   Stanley Bennett, am serving as a scribe for Dr. Antoine Primas.  I'm seeing this patient by the request  of:  Philip Aspen, Limmie Patricia, MD  CC: Back and hip pain  [id number]  11/17/2021 Patient does have the anterior lateral bone spur.  We discussed the possibility of shockwave therapy.  Patient wants to hold on that at the moment.  Discussed icing regimen and home exercises, core strengthening.  Did discuss with patient likely will need a hip replacement at some point in the future.  Patient will remain active and see me again in 3 months. Total time discussing the above including shockwave therapy greater than 18 minutes   Continues to have symptoms more of the hamstring and the piriformis.  Discussed the possibility of injection but at the moment patient wants to continue with conservative therapy.  Also could be a candidate for other further work-up including MRIs but at the moment this is not stopping patient except for sitting for long durations.  Patient will increase activity slowly and follow-up with me again in 3 months  Update 03/01/2022 Stanley Bennett is a 66 y.o. male coming in with complaint of R hip pain. Patient states no change. Neck is bothering him and hamstring pain still there.  Patient states not making any significant improvement.  Sometimes can be unbearable.  Starting to miss things with family secondary to the pain.  This is all more on the left side and the radicular symptoms.      Past Medical History:  Diagnosis Date   Allergy    seasonal allergies   Arthritis    generalized   Basal cell carcinoma    scalp   GERD (gastroesophageal reflux disease)    on meds   Gout    on gout   Hyperlipidemia    on meds   Past Surgical History:  Procedure Laterality Date   APPENDECTOMY     BASAL CELL CARCINOMA EXCISION     CERVICAL DISC SURGERY      COLONOSCOPY  05/2017   MAC-suprep(exc)-polyps   TONSILLECTOMY     UPPER GASTROINTESTINAL ENDOSCOPY  05/2017   normal   VASECTOMY     WISDOM TOOTH EXTRACTION     Social History   Socioeconomic History   Marital status: Married    Spouse name: Larita Fife   Number of children: 3   Years of education: Not on file   Highest education level: Bachelor's degree (e.g., BA, AB, BS)  Occupational History   Occupation: Brewing technologist    Comment: retired  Tobacco Use   Smoking status: Never   Smokeless tobacco: Never  Vaping Use   Vaping Use: Never used  Substance and Sexual Activity   Alcohol use: Yes    Alcohol/week: 5.0 standard drinks of alcohol    Types: 5 Standard drinks or equivalent per week    Comment: 5   Drug use: No   Sexual activity: Yes    Partners: Female  Other Topics Concern   Not on file  Social History Narrative   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, 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      Home Situation: lives with wife      Spiritual Beliefs: Catholic      Lifestyle: regular exercise and healthy diet   Social Determinants of Health   Financial Resource Strain: Low Risk  (12/20/2021)   Overall  Financial Resource Strain (CARDIA)    Difficulty of Paying Living Expenses: Not hard at all  Food Insecurity: No Food Insecurity (12/20/2021)   Hunger Vital Sign    Worried About Running Out of Food in the Last Year: Never true    Ran Out of Food in the Last Year: Never true  Transportation Needs: No Transportation Needs (12/20/2021)   PRAPARE - Administrator, Civil Service (Medical): No    Lack of Transportation (Non-Medical): No  Physical Activity: Sufficiently Active (12/20/2021)   Exercise Vital Sign    Days of Exercise per Week: 6 days    Minutes of Exercise per Session: 120 min  Stress: Stress Concern Present (12/20/2021)   [name]    Feeling of Stress : To some extent  Social  Connections: Socially Integrated (12/20/2021)   Social Connection and Isolation Panel [NHANES]    Frequency of Communication with Friends and Family: Once a week    Frequency of Social Gatherings with Friends and Family: More than three times a week    Attends Religious Services: More than 4 times per year    Active Member of [employer] or Organizations: Yes    Attends Engineer, structural: More than 4 times per year    Marital Status: Married   Allergies  Allergen Reactions   Teriflunomide Other (See Comments)   Tamiflu [Oseltamivir] Rash    Mild rash 2018   Family History  Problem Relation Age of Onset   Arthritis Mother        osteo   Arthritis Father        osteo   Heart disease Father        (pneumonia cause of death 58 yrs)   Prostate cancer Father    Diabetes Sister    Ovarian cancer Sister    Other Brother        celiac sprue   Prostate cancer Brother 75   Kidney failure Cousin        pat cousin   Kidney failure Cousin        pat cousin   Colon cancer Neg Hx    Esophageal cancer Neg Hx    Rectal cancer Neg Hx    Colon polyps Neg Hx    Stomach cancer Neg Hx      Current Outpatient Medications (Cardiovascular):    diltiazem (CARDIZEM CD) 120 MG 24 hr capsule, Take 1 capsule (120 mg total) by mouth daily.   EPINEPHrine 0.3 mg/0.3 mL IJ SOAJ injection, SMARTSIG:Injection As Directed   metoprolol succinate (TOPROL XL) 25 MG 24 hr tablet, Take 0.5 tablets (12.5 mg total) by mouth daily.   nitroGLYCERIN (NITROSTAT) 0.4 MG SL tablet, Place 1 tablet (0.4 mg total) under the tongue every 5 (five) minutes as needed for chest pain. Do not use within 48 ours of using cialis.   rosuvastatin (CRESTOR) 20 MG tablet, TAKE ONE TABLET BY MOUTH DAILY   tadalafil (CIALIS) 5 MG tablet, Take 1 tablet (5 mg total) by mouth daily.  Current Outpatient Medications (Respiratory):    fluticasone (FLONASE) 50 MCG/ACT nasal spray, Place 1 spray into both nostrils 2 (two) times daily.     loratadine (CLARITIN) 10 MG tablet, Take 10 mg by mouth daily.  Current Outpatient Medications (Analgesics):    allopurinol (ZYLOPRIM) 300 MG tablet, TAKE 1 TABLET(300 MG) BY MOUTH DAILY AS DIRECTED   aspirin EC 81 MG tablet, Take 1 tablet (81 mg  total) by mouth daily.   Current Outpatient Medications (Other):    pantoprazole (PROTONIX) 20 MG tablet, TAKE ONE TABLET BY MOUTH DAILY BEFORE BREAKFAST   PRESCRIPTION MEDICATION, Allergy shots every other week   Vitamin D, Ergocalciferol, (DRISDOL) 1.25 MG (50000 UNIT) CAPS capsule, Take 1 capsule (50,000 Units total) by mouth every 7 (seven) days.   Reviewed prior external information including notes and imaging from  primary care provider As well as notes that were available from care everywhere and other healthcare systems.  Past medical history, social, surgical and family history all reviewed in electronic medical record.  No pertanent information unless stated regarding to the chief complaint.   Review of Systems:  No headache, visual changes, nausea, vomiting, diarrhea, constipation, dizziness, abdominal pain, skin rash, fevers, chills, night sweats, weight loss, swollen lymph nodes, body aches, joint swelling, chest pain, shortness of breath, mood changes. POSITIVE muscle aches  Objective  Blood pressure 116/74, pulse (!) 59, height 5\' 9"  (1.753 m), weight 170 lb (77.1 kg), SpO2 98 %.   General: No apparent distress alert and oriented x3 mood and affect normal, dressed appropriately.  HEENT: Pupils equal, extraocular movements intact  Respiratory: Patient's speak in full sentences and does not appear short of breath  Cardiovascular: No lower extremity edema, non tender, no erythema  Low back does have loss of lordosis.  Positive straight leg test at 20 degrees of forward flexion on the left side.  Patient has difficulty with FABER test, as well on the left side with tenderness in the piriformis.  Patient's strength does have 4-5  strength of the extension of the foot compared to 5 out of 5 on the contralateral side.    Impression and Recommendations:    The above documentation has been reviewed and is accurate and complete Judi Saa, DO

## 2022-03-01 ENCOUNTER — Ambulatory Visit (INDEPENDENT_AMBULATORY_CARE_PROVIDER_SITE_OTHER): Payer: PPO | Admitting: Family Medicine

## 2022-03-01 ENCOUNTER — Ambulatory Visit: Payer: Self-pay

## 2022-03-01 VITALS — BP 116/74 | HR 59 | Ht 69.0 in | Wt 170.0 lb

## 2022-03-01 DIAGNOSIS — M545 Low back pain, unspecified: Secondary | ICD-10-CM

## 2022-03-01 DIAGNOSIS — G5702 Lesion of sciatic nerve, left lower limb: Secondary | ICD-10-CM

## 2022-03-01 NOTE — Assessment & Plan Note (Signed)
Patient's left side seems to be worsening over the course of time and now more of a radicular symptoms with a positive straight leg test on the left side.  Patient has had some mild progression arthritic changes were new compared to previous x-rays to left taking.  Concerned that patient could be potentially having a disc protrusion causing some potential nerve impingement. Discussed with patient about icing regimen and home exercises otherwise.

## 2022-03-01 NOTE — Patient Instructions (Signed)
[place] [phone] Call Today  When we receive your results we will contact you.  Will check with cardiologist on Cymbalta and will let you know if I can add

## 2022-03-02 ENCOUNTER — Encounter: Payer: PPO | Admitting: Family Medicine

## 2022-03-02 ENCOUNTER — Ambulatory Visit
Admission: RE | Admit: 2022-03-02 | Discharge: 2022-03-02 | Disposition: A | Discharge status: Home / Self Care | Payer: PPO | Source: Ambulatory Visit | Attending: Family Medicine | Admitting: Family Medicine

## 2022-03-02 DIAGNOSIS — M545 Low back pain, unspecified: Secondary | ICD-10-CM | POA: Diagnosis not present

## 2022-03-03 ENCOUNTER — Encounter: Payer: Self-pay | Admitting: Family Medicine

## 2022-03-03 DIAGNOSIS — J301 Allergic rhinitis due to pollen: Secondary | ICD-10-CM | POA: Diagnosis not present

## 2022-03-03 DIAGNOSIS — J3081 Allergic rhinitis due to animal (cat) (dog) hair and dander: Secondary | ICD-10-CM | POA: Diagnosis not present

## 2022-03-03 DIAGNOSIS — J3089 Other allergic rhinitis: Secondary | ICD-10-CM | POA: Diagnosis not present

## 2022-03-10 DIAGNOSIS — J3089 Other allergic rhinitis: Secondary | ICD-10-CM | POA: Diagnosis not present

## 2022-03-10 DIAGNOSIS — J3081 Allergic rhinitis due to animal (cat) (dog) hair and dander: Secondary | ICD-10-CM | POA: Diagnosis not present

## 2022-03-10 DIAGNOSIS — J301 Allergic rhinitis due to pollen: Secondary | ICD-10-CM | POA: Diagnosis not present

## 2022-03-16 DIAGNOSIS — J301 Allergic rhinitis due to pollen: Secondary | ICD-10-CM | POA: Diagnosis not present

## 2022-03-16 DIAGNOSIS — J3081 Allergic rhinitis due to animal (cat) (dog) hair and dander: Secondary | ICD-10-CM | POA: Diagnosis not present

## 2022-03-16 DIAGNOSIS — J3089 Other allergic rhinitis: Secondary | ICD-10-CM | POA: Diagnosis not present

## 2022-03-22 ENCOUNTER — Other Ambulatory Visit: Payer: Self-pay | Admitting: *Deleted

## 2022-03-22 MED ORDER — PANTOPRAZOLE SODIUM 20 MG PO TBEC: DELAYED_RELEASE_TABLET | ORAL | 1 refills | Status: DC

## 2022-03-22 NOTE — Telephone Encounter (Signed)
[facility] also faxed a refill request for Tadalafil '5mg'$ -take one tablet by mouth one time daily-#90.

## 2022-03-23 DIAGNOSIS — J3081 Allergic rhinitis due to animal (cat) (dog) hair and dander: Secondary | ICD-10-CM | POA: Diagnosis not present

## 2022-03-23 DIAGNOSIS — J301 Allergic rhinitis due to pollen: Secondary | ICD-10-CM | POA: Diagnosis not present

## 2022-03-23 DIAGNOSIS — J3089 Other allergic rhinitis: Secondary | ICD-10-CM | POA: Diagnosis not present

## 2022-03-25 ENCOUNTER — Encounter: Payer: Self-pay | Admitting: Internal Medicine

## 2022-03-25 ENCOUNTER — Other Ambulatory Visit: Payer: Self-pay | Admitting: *Deleted

## 2022-03-25 DIAGNOSIS — N401 Enlarged prostate with lower urinary tract symptoms: Secondary | ICD-10-CM

## 2022-03-28 MED ORDER — ALLOPURINOL 300 MG PO TABS: ORAL_TABLET | ORAL | 1 refills | Status: DC

## 2022-03-28 MED ORDER — TADALAFIL 5 MG PO TABS: 5.0000 mg | ORAL_TABLET | Freq: Every day | ORAL | 1 refills | Status: DC

## 2022-04-07 DIAGNOSIS — J3089 Other allergic rhinitis: Secondary | ICD-10-CM | POA: Diagnosis not present

## 2022-04-07 DIAGNOSIS — J3081 Allergic rhinitis due to animal (cat) (dog) hair and dander: Secondary | ICD-10-CM | POA: Diagnosis not present

## 2022-04-07 DIAGNOSIS — J301 Allergic rhinitis due to pollen: Secondary | ICD-10-CM | POA: Diagnosis not present

## 2022-04-13 ENCOUNTER — Other Ambulatory Visit (INDEPENDENT_AMBULATORY_CARE_PROVIDER_SITE_OTHER): Payer: PPO

## 2022-04-13 DIAGNOSIS — D509 Iron deficiency anemia, unspecified: Secondary | ICD-10-CM | POA: Diagnosis not present

## 2022-04-13 DIAGNOSIS — J301 Allergic rhinitis due to pollen: Secondary | ICD-10-CM | POA: Diagnosis not present

## 2022-04-13 DIAGNOSIS — R5383 Other fatigue: Secondary | ICD-10-CM | POA: Diagnosis not present

## 2022-04-13 DIAGNOSIS — J3089 Other allergic rhinitis: Secondary | ICD-10-CM | POA: Diagnosis not present

## 2022-04-13 DIAGNOSIS — J3081 Allergic rhinitis due to animal (cat) (dog) hair and dander: Secondary | ICD-10-CM | POA: Diagnosis not present

## 2022-04-13 LAB — URINALYSIS
Bilirubin Urine: NEGATIVE
Hgb urine dipstick: NEGATIVE
Ketones, ur: NEGATIVE
Leukocytes,Ua: NEGATIVE
Nitrite: NEGATIVE
Specific Gravity, Urine: 1.01 (ref 1.000–1.030)
Total Protein, Urine: NEGATIVE
Urine Glucose: NEGATIVE
Urobilinogen, UA: 0.2 (ref 0.0–1.0)
pH: 7 (ref 5.0–8.0)

## 2022-04-13 LAB — CBC WITH DIFFERENTIAL/PLATELET
Basophils Absolute: 0 10*3/uL (ref 0.0–0.1)
Basophils Relative: 0.6 % (ref 0.0–3.0)
Eosinophils Absolute: 0.1 10*3/uL (ref 0.0–0.7)
Eosinophils Relative: 2.3 % (ref 0.0–5.0)
HCT: 46.9 % (ref 39.0–52.0)
Hemoglobin: 15.5 g/dL (ref 13.0–17.0)
Lymphocytes Relative: 23.4 % (ref 12.0–46.0)
Lymphs Abs: 1.3 10*3/uL (ref 0.7–4.0)
MCHC: 33 g/dL (ref 30.0–36.0)
MCV: 89.5 fl (ref 78.0–100.0)
Monocytes Absolute: 0.5 10*3/uL (ref 0.1–1.0)
Monocytes Relative: 9.3 % (ref 3.0–12.0)
Neutro Abs: 3.6 10*3/uL (ref 1.4–7.7)
Neutrophils Relative %: 64.4 % (ref 43.0–77.0)
Platelets: 204 10*3/uL (ref 150.0–400.0)
RBC: 5.24 Mil/uL (ref 4.22–5.81)
RDW: 18.9 % — ABNORMAL HIGH (ref 11.5–15.5)
WBC: 5.6 10*3/uL (ref 4.0–10.5)

## 2022-04-13 LAB — COMPREHENSIVE METABOLIC PANEL
ALT: 24 U/L (ref 0–53)
AST: 27 U/L (ref 0–37)
Albumin: 3.9 g/dL (ref 3.5–5.2)
Alkaline Phosphatase: 53 U/L (ref 39–117)
BUN: 15 mg/dL (ref 6–23)
CO2: 28 mEq/L (ref 19–32)
Calcium: 9 mg/dL (ref 8.4–10.5)
Chloride: 103 mEq/L (ref 96–112)
Creatinine, Ser: 0.85 mg/dL (ref 0.40–1.50)
GFR: 89.68 mL/min (ref >60.00)
Glucose, Bld: 72 mg/dL (ref 70–99)
Potassium: 4.6 mEq/L (ref 3.5–5.1)
Sodium: 139 mEq/L (ref 135–145)
Total Bilirubin: 0.9 mg/dL (ref 0.2–1.2)
Total Protein: 6.5 g/dL (ref 6.0–8.3)

## 2022-04-13 LAB — IBC PANEL
Iron: 251 ug/dL — ABNORMAL HIGH (ref 42–165)
Saturation Ratios: 84.2 % — ABNORMAL HIGH (ref 20.0–50.0)
TIBC: 298.2 ug/dL (ref 250.0–450.0)
Transferrin: 213 mg/dL (ref 212.0–360.0)

## 2022-04-13 LAB — LIPID PANEL
Cholesterol: 157 mg/dL (ref 0–200)
HDL: 66.4 mg/dL (ref >39.00)
LDL Cholesterol: 78 mg/dL (ref 0–99)
NonHDL: 90.87
Total CHOL/HDL Ratio: 2
Triglycerides: 62 mg/dL (ref 0.0–149.0)
VLDL: 12.4 mg/dL (ref 0.0–40.0)

## 2022-04-13 LAB — FERRITIN: Ferritin: 27 ng/mL (ref 22.0–322.0)

## 2022-04-13 LAB — HEMOGLOBIN A1C: Hgb A1c MFr Bld: 6.1 % (ref 4.6–6.5)

## 2022-04-13 LAB — TSH: TSH: 5.01 u[IU]/mL (ref 0.35–5.50)

## 2022-04-13 LAB — VITAMIN B12: Vitamin B-12: 411 pg/mL (ref 211–911)

## 2022-04-13 LAB — VITAMIN D 25 HYDROXY (VIT D DEFICIENCY, FRACTURES): VITD: 55.82 ng/mL (ref 30.00–100.00)

## 2022-04-13 LAB — PSA: PSA: 1.63 ng/mL (ref 0.10–4.00)

## 2022-04-19 ENCOUNTER — Encounter: Payer: Self-pay | Admitting: Internal Medicine

## 2022-04-19 ENCOUNTER — Ambulatory Visit (INDEPENDENT_AMBULATORY_CARE_PROVIDER_SITE_OTHER): Payer: PPO | Admitting: Internal Medicine

## 2022-04-19 VITALS — BP 110/70 | HR 56 | Temp 98.0°F | Ht 70.0 in | Wt 171.7 lb

## 2022-04-19 DIAGNOSIS — D509 Iron deficiency anemia, unspecified: Secondary | ICD-10-CM

## 2022-04-19 DIAGNOSIS — Z23 Encounter for immunization: Secondary | ICD-10-CM

## 2022-04-19 DIAGNOSIS — Z0000 Encounter for general adult medical examination without abnormal findings: Secondary | ICD-10-CM

## 2022-04-19 DIAGNOSIS — E7800 Pure hypercholesterolemia, unspecified: Secondary | ICD-10-CM | POA: Diagnosis not present

## 2022-04-19 NOTE — Progress Notes (Signed)
Established Patient Office Visit     CC/Reason for Visit: Annual preventive exam and subsequent medicare wellness visit.  HPI: Stanley Bennett is a 66 y.o. male who is coming in today for the above mentioned reasons. Past Medical History is significant for: ED, palpitations, hyperlipidemia and known aortic atherosclerosis on a statin. He was recently diagnosed with iron deficiency anemia with a thoro negative workup for blood loss. He has been taking ferrous sulfate twice daily. Review of A1c also shows IGT going back at least 5 years based on A1c between 5.8-6.1. His extreme fatigue has improved after coming off metoprolol but palpitations have become more prevalent. He has been seeing Dr. Harrell Gave for these. Due for flu and pneumonia vaccines. Cancer screenings are up to date.   Past Medical/Surgical History: Past Medical History:  Diagnosis Date   Allergy    seasonal allergies   Arthritis    generalized   Basal cell carcinoma    scalp   GERD (gastroesophageal reflux disease)    on meds   Gout    on gout   Hyperlipidemia    on meds    Past Surgical History:  Procedure Laterality Date   APPENDECTOMY     BASAL CELL CARCINOMA EXCISION     CERVICAL DISC SURGERY     COLONOSCOPY  05/2017   MAC-suprep(exc)-polyps   TONSILLECTOMY     UPPER GASTROINTESTINAL ENDOSCOPY  05/2017   normal   VASECTOMY     WISDOM TOOTH EXTRACTION      Social History:  reports that he has never smoked. He has never used smokeless tobacco. He reports current alcohol use of about 5.0 standard drinks of alcohol per week. He reports that he does not use drugs.  Allergies: Allergies  Allergen Reactions   Teriflunomide Other (See Comments)   Tamiflu [Oseltamivir] Rash    Mild rash 2018    Family History:  Family History  Problem Relation Age of Onset   Arthritis Mother        osteo   Arthritis Father        osteo   Heart disease Father        (pneumonia cause of death 54 yrs)    Prostate cancer Father    Diabetes Sister    Ovarian cancer Sister    Other Brother        celiac sprue   Prostate cancer Brother 1   Kidney failure Cousin        pat cousin   Kidney failure Cousin        pat cousin   Colon cancer Neg Hx    Esophageal cancer Neg Hx    Rectal cancer Neg Hx    Colon polyps Neg Hx    Stomach cancer Neg Hx      Current Outpatient Medications:    allopurinol (ZYLOPRIM) 300 MG tablet, TAKE 1 TABLET(300 MG) BY MOUTH DAILY AS DIRECTED, Disp: 90 tablet, Rfl: 1   aspirin EC 81 MG tablet, Take 1 tablet (81 mg total) by mouth daily., Disp: 90 tablet, Rfl: 3   diltiazem (CARDIZEM CD) 120 MG 24 hr capsule, Take 1 capsule (120 mg total) by mouth daily., Disp: 90 capsule, Rfl: 3   EPINEPHrine 0.3 mg/0.3 mL IJ SOAJ injection, SMARTSIG:Injection As Directed, Disp: , Rfl:    fluticasone (FLONASE) 50 MCG/ACT nasal spray, Place 1 spray into both nostrils 2 (two) times daily. , Disp: , Rfl:    loratadine (CLARITIN) 10 MG tablet,  Take 10 mg by mouth daily., Disp: , Rfl:    metoprolol succinate (TOPROL XL) 25 MG 24 hr tablet, Take 0.5 tablets (12.5 mg total) by mouth daily., Disp: 15 tablet, Rfl: 2   pantoprazole (PROTONIX) 20 MG tablet, TAKE ONE TABLET BY MOUTH DAILY BEFORE BREAKFAST, Disp: 90 tablet, Rfl: 1   PRESCRIPTION MEDICATION, Allergy shots every other week, Disp: , Rfl:    rosuvastatin (CRESTOR) 20 MG tablet, TAKE ONE TABLET BY MOUTH DAILY, Disp: 90 tablet, Rfl: 2   tadalafil (CIALIS) 5 MG tablet, Take 1 tablet (5 mg total) by mouth daily., Disp: 90 tablet, Rfl: 1   Vitamin D, Ergocalciferol, (DRISDOL) 1.25 MG (50000 UNIT) CAPS capsule, Take 1 capsule (50,000 Units total) by mouth every 7 (seven) days., Disp: 12 capsule, Rfl: 0   nitroGLYCERIN (NITROSTAT) 0.4 MG SL tablet, Place 1 tablet (0.4 mg total) under the tongue every 5 (five) minutes as needed for chest pain. Do not use within 48 ours of using cialis., Disp: 90 tablet, Rfl: 3  Review of Systems:   Constitutional: Denies fever, chills, diaphoresis, appetite change and fatigue.  HEENT: Denies photophobia, eye pain, redness, hearing loss, ear pain, congestion, sore throat, rhinorrhea, sneezing, mouth sores, trouble swallowing, neck pain, neck stiffness and tinnitus.   Respiratory: Denies SOB, DOE, cough, chest tightness,  and wheezing.   Cardiovascular: Denies chest pain, palpitations and leg swelling.  Gastrointestinal: Denies nausea, vomiting, abdominal pain, diarrhea, constipation, blood in stool and abdominal distention.  Genitourinary: Denies dysuria, urgency, frequency, hematuria, flank pain and difficulty urinating.  Endocrine: Denies: hot or cold intolerance, sweats, changes in hair or nails, polyuria, polydipsia. Musculoskeletal: Denies myalgias, back pain, joint swelling, arthralgias and gait problem.  Skin: Denies pallor, rash and wound.  Neurological: Denies dizziness, seizures, syncope, weakness, light-headedness, numbness and headaches.  Hematological: Denies adenopathy. Easy bruising, personal or family bleeding history  Psychiatric/Behavioral: Denies suicidal ideation, mood changes, confusion, nervousness, sleep disturbance and agitation    Physical Exam: Vitals:   04/19/22 1036  BP: 110/70  Pulse: (!) 56  Temp: 98 F (36.7 C)  TempSrc: Oral  SpO2: 98%  Weight: 171 lb 11.2 oz (77.9 kg)  Height: _0  (1.778 m)    Body mass index is 24.64 kg/m.   Constitutional: NAD, calm, comfortable Eyes: PERRL, lids and conjunctivae normal ENMT: Mucous membranes are moist. Posterior pharynx clear of any exudate or lesions. Normal dentition. Tympanic membrane is pearly white, no erythema or bulging. Neck: normal, supple, no masses, no thyromegaly Respiratory: clear to auscultation bilaterally, no wheezing, no crackles. Normal respiratory effort. No accessory muscle use.  Cardiovascular: Regular rate and rhythm, no murmurs / rubs / gallops. No extremity edema. 2+ pedal  pulses. No carotid bruits.  Abdomen: no tenderness, no masses palpated. No hepatosplenomegaly. Bowel sounds positive.  Musculoskeletal: no clubbing / cyanosis. No joint deformity upper and lower extremities. Good ROM, no contractures. Normal muscle tone.  Skin: no rashes, lesions, ulcers. No induration Neurologic: CN 2-12 grossly intact. Sensation intact, DTR normal. Strength 5/5 in all 4.  Psychiatric: Normal judgment and insight. Alert and oriented x 3. Normal mood.    Subsequent Medicare wellness visit   1. Risk factors, based on past  M,S,F - CVD risk factors include age ,gender and known atherosclerosis of the aorta   2.  Physical activities: walks 5 miles a day.   3.  Depression/mood: stable, not depressed   4.  Hearing: decreased hearing bilaterally, has tinnitus   5.  ADL's: independent  6.  Fall risk: low risk   7.  Home safety: No problems identified   8.  Height weight, and visual acuity: height and weight as above, vision:  Vision Screening   Right eye Left eye Both eyes  Without correction     With correction _0     9.  Counseling: advised to update vaccines   10. Lab orders based on risk factors: Laboratory update will be reviewed   11. Referral : none today   12. Care plan: f/u with me in 6 months   13. Cognitive assessment: no cognitive impairment   14. Screening: Patient provided with a written and personalized 5-10 year screening schedule in the AVS.  yes   15. Provider List Update:  PCP, cardiology  16. Advance Directives: full code   17. Opioids: Patient is not on any opioid prescriptions and has no risk factors for a substance use disorder.    [place] Office Visit from 04/19/2022 in [place] at [facility]  PHQ-9 Total Score 2          07/25/2021   12:48 AM 12/20/2021    4:43 PM 12/24/2021    3:57 PM 01/18/2022    1:29 PM 04/19/2022   10:35 AM  Fall Risk  Falls in the past year?  0 0 0 0  Was there an  injury with Fall?   0 0 0  Fall Risk Category Calculator   0 0 0  Fall Risk Category   Low Low Low  Patient Fall Risk Level High fall risk  High fall risk Low fall risk Low fall risk  Patient at Risk for Falls Due to   History of fall(s) No Fall Risks No Fall Risks  Fall risk Follow up   Falls evaluation completed Falls evaluation completed Falls evaluation completed     Impression and Plan:  Encounter for preventive health examination -Recommend routine eye and dental care. -Immunizations:PCV 80 today -Healthy lifestyle discussed in detail. -Labs to be updated today. -Colon cancer screening: 08/2020 -Breast cancer screening: N/A -Cervical cancer screening: N/A -Lung cancer screening: N/A -Prostate cancer screening: PSA 1.6 03/2022 -DEXA: N/A  Pure hypercholesterolemia -lipids look good.  Need for vaccination against Streptococcus pneumoniae  - Plan: Pneumococcal conjugate vaccine 20-valent (Prevnar 20)  Iron deficiency anemia, unspecified iron deficiency anemia type -FeSo4 daily, recheck iron studies in 3 months     [place]on [place], MD [facility]

## 2022-04-20 DIAGNOSIS — J3081 Allergic rhinitis due to animal (cat) (dog) hair and dander: Secondary | ICD-10-CM | POA: Diagnosis not present

## 2022-04-20 DIAGNOSIS — J3089 Other allergic rhinitis: Secondary | ICD-10-CM | POA: Diagnosis not present

## 2022-04-20 DIAGNOSIS — J301 Allergic rhinitis due to pollen: Secondary | ICD-10-CM | POA: Diagnosis not present

## 2022-04-27 DIAGNOSIS — J3081 Allergic rhinitis due to animal (cat) (dog) hair and dander: Secondary | ICD-10-CM | POA: Diagnosis not present

## 2022-04-27 DIAGNOSIS — J3089 Other allergic rhinitis: Secondary | ICD-10-CM | POA: Diagnosis not present

## 2022-04-27 DIAGNOSIS — J301 Allergic rhinitis due to pollen: Secondary | ICD-10-CM | POA: Diagnosis not present

## 2022-05-12 DIAGNOSIS — J3089 Other allergic rhinitis: Secondary | ICD-10-CM | POA: Diagnosis not present

## 2022-05-12 DIAGNOSIS — J3081 Allergic rhinitis due to animal (cat) (dog) hair and dander: Secondary | ICD-10-CM | POA: Diagnosis not present

## 2022-05-12 DIAGNOSIS — J301 Allergic rhinitis due to pollen: Secondary | ICD-10-CM | POA: Diagnosis not present

## 2022-06-03 DIAGNOSIS — J3081 Allergic rhinitis due to animal (cat) (dog) hair and dander: Secondary | ICD-10-CM | POA: Diagnosis not present

## 2022-06-03 DIAGNOSIS — J3089 Other allergic rhinitis: Secondary | ICD-10-CM | POA: Diagnosis not present

## 2022-06-03 DIAGNOSIS — J301 Allergic rhinitis due to pollen: Secondary | ICD-10-CM | POA: Diagnosis not present

## 2022-06-08 DIAGNOSIS — J301 Allergic rhinitis due to pollen: Secondary | ICD-10-CM | POA: Diagnosis not present

## 2022-06-08 DIAGNOSIS — J3081 Allergic rhinitis due to animal (cat) (dog) hair and dander: Secondary | ICD-10-CM | POA: Diagnosis not present

## 2022-06-08 DIAGNOSIS — J3089 Other allergic rhinitis: Secondary | ICD-10-CM | POA: Diagnosis not present

## 2022-06-24 DIAGNOSIS — J301 Allergic rhinitis due to pollen: Secondary | ICD-10-CM | POA: Diagnosis not present

## 2022-06-24 DIAGNOSIS — J3081 Allergic rhinitis due to animal (cat) (dog) hair and dander: Secondary | ICD-10-CM | POA: Diagnosis not present

## 2022-06-24 DIAGNOSIS — J3089 Other allergic rhinitis: Secondary | ICD-10-CM | POA: Diagnosis not present

## 2022-06-30 DIAGNOSIS — J301 Allergic rhinitis due to pollen: Secondary | ICD-10-CM | POA: Diagnosis not present

## 2022-06-30 DIAGNOSIS — J3081 Allergic rhinitis due to animal (cat) (dog) hair and dander: Secondary | ICD-10-CM | POA: Diagnosis not present

## 2022-06-30 DIAGNOSIS — J3089 Other allergic rhinitis: Secondary | ICD-10-CM | POA: Diagnosis not present

## 2022-07-04 ENCOUNTER — Ambulatory Visit (INDEPENDENT_AMBULATORY_CARE_PROVIDER_SITE_OTHER): Payer: PPO

## 2022-07-04 ENCOUNTER — Ambulatory Visit: Payer: PPO

## 2022-07-04 VITALS — Ht 70.0 in | Wt 166.0 lb

## 2022-07-04 DIAGNOSIS — Z0000 Encounter for general adult medical examination without abnormal findings: Secondary | ICD-10-CM

## 2022-07-04 NOTE — Patient Instructions (Addendum)
Stanley Bennett , Thank you for taking time to come for your Medicare Wellness Visit. I appreciate your ongoing commitment to your health goals. Please review the following plan we discussed and let me know if I can assist you in the future.   These are the goals we discussed:  Goals       Patient Stated (pt-stated)      Maintain weight.      Patient Stated      06/28/2021, remain active        This is a list of the screening recommended for you and due dates:  Health Maintenance  Topic Date Due   COVID-19 Vaccine (6 - Pfizer series) 07/20/2022*   Flu Shot  11/13/2022*   Medicare Annual Wellness Visit  07/05/2023   Colon Cancer Screening  08/25/2025   Pneumonia Vaccine  Completed   Hepatitis C Screening: USPSTF Recommendation to screen - Ages 18-79 yo.  Completed   Zoster (Shingles) Vaccine  Completed   HPV Vaccine  Aged Out  *Topic was postponed. The date shown is not the original due date.    Advanced directives: Please bring a copy of your health care power of attorney and living will to the office to be added to your chart at your convenience.   Conditions/risks identified: None  Next appointment: Follow up in one year for your annual wellness visit.    Preventive Care 44 Years and Older, Male  Preventive care refers to lifestyle choices and visits with your health care provider that can promote health and wellness. What does preventive care include? A yearly physical exam. This is also called an annual well check. Dental exams once or twice a year. Routine eye exams. Ask your health care provider how often you should have your eyes checked. Personal lifestyle choices, including: Daily care of your teeth and gums. Regular physical activity. Eating a healthy diet. Avoiding tobacco and drug use. Limiting alcohol use. Practicing safe sex. Taking low doses of aspirin every day. Taking vitamin and mineral supplements as recommended by your health care provider. What  happens during an annual well check? The services and screenings done by your health care provider during your annual well check will depend on your age, overall health, lifestyle risk factors, and family history of disease. Counseling  Your health care provider may ask you questions about your: Alcohol use. Tobacco use. Drug use. Emotional well-being. Home and relationship well-being. Sexual activity. Eating habits. History of falls. Memory and ability to understand (cognition). Work and work Statistician. Screening  You may have the following tests or measurements: Height, weight, and BMI. Blood pressure. Lipid and cholesterol levels. These may be checked every 5 years, or more frequently if you are over 67 years old. Skin check. Lung cancer screening. You may have this screening every year starting at age 61 if you have a 30-pack-year history of smoking and currently smoke or have quit within the past 15 years. Fecal occult blood test (FOBT) of the stool. You may have this test every year starting at age 47. Flexible sigmoidoscopy or colonoscopy. You may have a sigmoidoscopy every 5 years or a colonoscopy every 10 years starting at age 37. Prostate cancer screening. Recommendations will vary depending on your family history and other risks. Hepatitis C blood test. Hepatitis B blood test. Sexually transmitted disease (STD) testing. Diabetes screening. This is done by checking your blood sugar (glucose) after you have not eaten for a while (fasting). You may have this done  every 1-3 years. Abdominal aortic aneurysm (AAA) screening. You may need this if you are a current or former smoker. Osteoporosis. You may be screened starting at age 55 if you are at high risk. Talk with your health care provider about your test results, treatment options, and if necessary, the need for more tests. Vaccines  Your health care provider may recommend certain vaccines, such as: Influenza vaccine. This  is recommended every year. Tetanus, diphtheria, and acellular pertussis (Tdap, Td) vaccine. You may need a Td booster every 10 years. Zoster vaccine. You may need this after age 53. Pneumococcal 13-valent conjugate (PCV13) vaccine. One dose is recommended after age 70. Pneumococcal polysaccharide (PPSV23) vaccine. One dose is recommended after age 70. Talk to your health care provider about which screenings and vaccines you need and how often you need them. This information is not intended to replace advice given to you by your health care provider. Make sure you discuss any questions you have with your health care provider. Document Released: 08/28/2015 Document Revised: 04/20/2016 Document Reviewed: 06/02/2015 Elsevier Interactive Patient Education  2017 [place] Prevention in the Home Falls can cause injuries. They can happen to people of all ages. There are many things you can do to make your home safe and to help prevent falls. What can I do on the outside of my home? Regularly fix the edges of walkways and driveways and fix any cracks. Remove anything that might make you trip as you walk through a door, such as a raised step or threshold. Trim any bushes or trees on the path to your home. Use bright outdoor lighting. Clear any walking paths of anything that might make someone trip, such as rocks or tools. Regularly check to see if handrails are loose or broken. Make sure that both sides of any steps have handrails. Any raised decks and porches should have guardrails on the edges. Have any leaves, snow, or ice cleared regularly. Use sand or salt on walking paths during winter. Clean up any spills in your garage right away. This includes oil or grease spills. What can I do in the bathroom? Use night lights. Install grab bars by the toilet and in the tub and shower. Do not use towel bars as grab bars. Use non-skid mats or decals in the tub or shower. If you need to sit down  in the shower, use a plastic, non-slip stool. Keep the floor dry. Clean up any water that spills on the floor as soon as it happens. Remove soap buildup in the tub or shower regularly. Attach bath mats securely with double-sided non-slip rug tape. Do not have throw rugs and other things on the floor that can make you trip. What can I do in the bedroom? Use night lights. Make sure that you have a light by your bed that is easy to reach. Do not use any sheets or blankets that are too big for your bed. They should not hang down onto the floor. Have a firm chair that has side arms. You can use this for support while you get dressed. Do not have throw rugs and other things on the floor that can make you trip. What can I do in the kitchen? Clean up any spills right away. Avoid walking on wet floors. Keep items that you use a lot in easy-to-reach places. If you need to reach something above you, use a strong step stool that has a grab bar. Keep electrical cords out of the  way. Do not use floor polish or wax that makes floors slippery. If you must use wax, use non-skid floor wax. Do not have throw rugs and other things on the floor that can make you trip. What can I do with my stairs? Do not leave any items on the stairs. Make sure that there are handrails on both sides of the stairs and use them. Fix handrails that are broken or loose. Make sure that handrails are as Minnie as the stairways. Check any carpeting to make sure that it is firmly attached to the stairs. Fix any carpet that is loose or worn. Avoid having throw rugs at the top or bottom of the stairs. If you do have throw rugs, attach them to the floor with carpet tape. Make sure that you have a light switch at the top of the stairs and the bottom of the stairs. If you do not have them, ask someone to add them for you. What else can I do to help prevent falls? Wear shoes that: Do not have high heels. Have rubber bottoms. Are comfortable  and fit you well. Are closed at the toe. Do not wear sandals. If you use a stepladder: Make sure that it is fully opened. Do not climb a closed stepladder. Make sure that both sides of the stepladder are locked into place. Ask someone to hold it for you, if possible. Clearly mark and make sure that you can see: Any grab bars or handrails. First and last steps. Where the edge of each step is. Use tools that help you move around (mobility aids) if they are needed. These include: Canes. Walkers. Scooters. Crutches. Turn on the lights when you go into a dark area. Replace any light bulbs as soon as they burn out. Set up your furniture so you have a clear path. Avoid moving your furniture around. If any of your floors are uneven, fix them. If there are any pets around you, be aware of where they are. Review your medicines with your doctor. Some medicines can make you feel dizzy. This can increase your chance of falling. Ask your doctor what other things that you can do to help prevent falls. This information is not intended to replace advice given to you by your health care provider. Make sure you discuss any questions you have with your health care provider. Document Released: 05/28/2009 Document Revised: 01/07/2016 Document Reviewed: 09/05/2014 Elsevier Interactive Patient Education  2017 [employer].

## 2022-07-04 NOTE — Progress Notes (Signed)
Subjective:   Stanley Bennett is a 66 y.o. male who presents for Medicare Annual/Subsequent preventive examination.  Review of Systems    Virtual Visit via Telephone Note  I connected with  Stanley Bennett on 07/04/22 at  2:30 PM EST by telephone and verified that I am speaking with the correct person using two identifiers.  Location: Patient: Home Provider: Office Persons participating in the virtual visit: patient/Nurse Health Advisor   I discussed the limitations, risks, security and privacy concerns of performing an evaluation and management service by telephone and the availability of in person appointments. The patient expressed understanding and agreed to proceed.  Interactive audio and video telecommunications were attempted between this nurse and patient, however failed, due to patient having technical difficulties OR patient did not have access to video capability.  We continued and completed visit with audio only.  Some vital signs may be absent or patient reported.   Criselda Peaches, LPN  Cardiac Risk Factors include: advanced age (>47mn, >>67women);male gender     Objective:    Today's Vitals   07/04/22 1436  Weight: 166 lb (75.3 kg)  Height: _0  (1.778 m)   Body mass index is 23.82 kg/m.     07/04/2022    2:42 PM 07/25/2021   12:48 AM 06/28/2021    1:47 PM 06/09/2021   11:11 AM 06/17/2020    9:43 AM 02/13/2019    8:02 AM 05/15/2017    7:23 AM  Advanced Directives  Does Patient Have a Medical Advance Directive? Yes No _1   Type of AParamedicof ANew SalemLiving will  HSeth WardLiving will HMaunaboLiving will HRedbird SmithLiving will Living will HSt. JohnsLiving will  Does patient want to make changes to medical advance directive?    No - Patient declined  No - Patient declined   Copy of H[place] Chart? No -  copy requested  No - copy requested  No - copy requested  No - copy requested    Current Medications (verified) Outpatient Encounter Medications as of 07/04/2022  Medication Sig   allopurinol (ZYLOPRIM) 300 MG tablet TAKE 1 TABLET(300 MG) BY MOUTH DAILY AS DIRECTED   aspirin EC 81 MG tablet Take 1 tablet (81 mg total) by mouth daily.   diltiazem (CARDIZEM CD) 120 MG 24 hr capsule Take 1 capsule (120 mg total) by mouth daily.   EPINEPHrine 0.3 mg/0.3 mL IJ SOAJ injection SMARTSIG:Injection As Directed   fluticasone (FLONASE) 50 MCG/ACT nasal spray Place 1 spray into both nostrils 2 (two) times daily.    loratadine (CLARITIN) 10 MG tablet Take 10 mg by mouth daily.   metoprolol succinate (TOPROL XL) 25 MG 24 hr tablet Take 0.5 tablets (12.5 mg total) by mouth daily.   nitroGLYCERIN (NITROSTAT) 0.4 MG SL tablet Place 1 tablet (0.4 mg total) under the tongue every 5 (five) minutes as needed for chest pain. Do not use within 48 ours of using cialis.   pantoprazole (PROTONIX) 20 MG tablet TAKE ONE TABLET BY MOUTH DAILY BEFORE BREAKFAST   PRESCRIPTION MEDICATION Allergy shots every other week   rosuvastatin (CRESTOR) 20 MG tablet TAKE ONE TABLET BY MOUTH DAILY   tadalafil (CIALIS) 5 MG tablet Take 1 tablet (5 mg total) by mouth daily.   Vitamin D, Ergocalciferol, (DRISDOL) 1.25 MG (50000 UNIT) CAPS capsule Take 1 capsule (50,000 Units total) by mouth every 7 (seven)  days.   No facility-administered encounter medications on file as of 07/04/2022.    Allergies (verified) Teriflunomide and Tamiflu [oseltamivir]   History: Past Medical History:  Diagnosis Date   Allergy    seasonal allergies   Arthritis    generalized   Basal cell carcinoma    scalp   GERD (gastroesophageal reflux disease)    on meds   Gout    on gout   Hyperlipidemia    on meds   Past Surgical History:  Procedure Laterality Date   APPENDECTOMY     BASAL CELL CARCINOMA EXCISION     CERVICAL DISC SURGERY      COLONOSCOPY  05/2017   MAC-suprep(exc)-polyps   TONSILLECTOMY     UPPER GASTROINTESTINAL ENDOSCOPY  05/2017   normal   VASECTOMY     WISDOM TOOTH EXTRACTION     Family History  Problem Relation Age of Onset   Arthritis Mother        osteo   Arthritis Father        osteo   Heart disease Father        (pneumonia cause of death 55 yrs)   Prostate cancer Father    Diabetes Sister    Ovarian cancer Sister    Other Brother        celiac sprue   Prostate cancer Brother 8   Kidney failure Cousin        pat cousin   Kidney failure Cousin        pat cousin   Colon cancer Neg Hx    Esophageal cancer Neg Hx    Rectal cancer Neg Hx    Colon polyps Neg Hx    Stomach cancer Neg Hx    Social History   Socioeconomic History   Marital status: Married    Spouse name: [name]   Number of children: 3   Years of education: Not on file   Highest education level: Bachelor's degree (e.g., BA, AB, BS)  Occupational History   Occupation: Development worker, international aid    Comment: retired  Tobacco Use   Smoking status: Never   Smokeless tobacco: Never  Vaping Use   Vaping Use: Never used  Substance and Sexual Activity   Alcohol use: Yes    Alcohol/week: 5.0 standard drinks of alcohol    Types: 5 Standard drinks or equivalent per week    Comment: 5   Drug use: No   Sexual activity: Yes    Partners: Female  Other Topics Concern   Not on file  Social History Narrative   Work or School: [employer], 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      Home Situation: lives with wife      Spiritual Beliefs: Catholic      Lifestyle: regular exercise and healthy diet   Social Determinants of Health   Financial Resource Strain: Low Risk  (07/04/2022)   Overall Financial Resource Strain (CARDIA)    Difficulty of Paying Living Expenses: Not hard at all  Food Insecurity: No Food Insecurity (07/04/2022)   Hunger Vital Sign    Worried About Running Out of Food in the Last Year: Never true    [place] in the Last Year: Never true  Transportation Needs: No Transportation Needs (07/04/2022)   PRAPARE - Hydrologist (Medical): No    Lack of Transportation (Non-Medical): No  Physical Activity: Sufficiently Active (07/04/2022)   Exercise Vital Sign    Days of Exercise  per Week: 6 days    Minutes of Exercise per Session: 60 min  Stress: No Stress Concern Present (07/04/2022)   [place]    Feeling of Stress : Not at all  Social Connections: [place] (07/04/2022)   Social Connection and Isolation Panel [NHANES]    Frequency of Communication with Friends and Family: More than three times a week    Frequency of Social Gatherings with Friends and Family: More than three times a week    Attends Religious Services: More than 4 times per year    Active Member of [employer] or Organizations: Yes    Attends Music therapist: More than 4 times per year    Marital Status: Married    Tobacco Counseling Counseling given: Not Answered   Clinical Intake:  Pre-visit preparation completed: No  Pain : No/denies pain     BMI - recorded: 23.82 Nutritional Status: BMI of 19-24  Normal Nutritional Risks: None Diabetes: No  How often do you need to have someone help you when you read instructions, pamphlets, or other written materials from your doctor or pharmacy?: 1 - Never  Diabetic?  No  Interpreter Needed?: No  Information entered by :: Rolene Arbour LPN   Activities of Daily Living    07/04/2022    2:41 PM  In your present state of health, do you have any difficulty performing the following activities:  Hearing? 0  Vision? 0  Difficulty concentrating or making decisions? 0  Walking or climbing stairs? 0  Dressing or bathing? 0  Doing errands, shopping? 0  Preparing Food and eating ? N  Using the Toilet? N  In the past six months, have you accidently leaked  urine? N  Do you have problems with loss of bowel control? N  Managing your Medications? N  Managing your Finances? N  Housekeeping or managing your Housekeeping? N    Patient Care Team: Isaac Bliss, Rayford Halsted, MD as PCP - General (Internal Medicine) Buford Dresser, MD as PCP - Cardiology ([facility] [place] any recent Medical Services you may have received from other than [facility] providers in the past year (date may be approximate).     Assessment:   This is a routine wellness examination for Stanley Bennett.  Hearing/Vision screen Hearing Screening - Comments:: Denies hearing difficulties   Vision Screening - Comments:: Wears rx glasses - up to date with routine eye exams with  Dr Sabra Heck  Dietary issues and exercise activities discussed: Current Exercise Habits: Home exercise routine, Time (Minutes): 60, Frequency (Times/Week): 6, Weekly Exercise (Minutes/Week): 360, Intensity: Moderate, Exercise limited by: None identified   Goals Addressed               This Visit's Progress     Patient Stated (pt-stated)        Maintain weight.       Depression Screen    07/04/2022    2:40 PM 04/19/2022   10:35 AM 01/18/2022    1:27 PM 12/24/2021    3:57 PM 06/28/2021    1:47 PM 03/01/2021    8:53 AM 06/17/2020    9:42 AM  PHQ 2/9 Scores  PHQ - 2 Score 0 0 0 0 0 0 0  PHQ- 9 Score  _0 Fall Risk    07/04/2022    2:41 PM 04/19/2022   10:35 AM 01/18/2022    1:29 PM 12/24/2021  3:57 PM 12/20/2021    4:43 PM  Fall Risk   Falls in the past year? 0 0 0 0 0  Number falls in past yr: 0 0 0 0   Injury with Fall? 0 0 0 0   Risk for fall due to : No Fall Risks No Fall Risks No Fall Risks History of fall(s)   Follow up Falls prevention discussed Falls evaluation completed Falls evaluation completed Falls evaluation completed     [place]:  Any stairs in or around the home? Yes  If so, are there any without  handrails? No  Home free of loose throw rugs in walkways, pet beds, electrical cords, etc? Yes  Adequate lighting in your home to reduce risk of falls? Yes   ASSISTIVE DEVICES UTILIZED TO PREVENT FALLS:  Life alert? No  Use of a cane, walker or w/c? No  Grab bars in the bathroom? No  Shower chair or bench in shower? No  Elevated toilet seat or a handicapped toilet? No   TIMED UP AND GO:  Was the test performed? No . Audio Visit  Cognitive Function:        07/04/2022    2:42 PM 06/28/2021    1:48 PM 06/17/2020    9:47 AM  6CIT Screen  What Year? 0 points 0 points 0 points  What month? 0 points 0 points 0 points  What time? 0 points 0 points   Count back from 20 0 points 0 points 0 points  Months in reverse 0 points 0 points 0 points  Repeat phrase 0 points 0 points 0 points  Total Score 0 points 0 points     Immunizations Immunization History  Administered Date(s) Administered   Influenza, High Dose Seasonal PF 10/07/2016, 11/20/2019   Influenza-Unspecified 05/27/2010, 05/14/2013, 05/26/2015, 05/15/2017, 06/03/2017, 05/17/2018, 05/19/2019, 05/05/2020, 05/17/2021   PFIZER(Purple Top)SARS-COV-2 Vaccination 09/04/2019, 09/22/2019, 11/20/2019, 12/07/2020   PNEUMOCOCCAL CONJUGATE-20 04/19/2022   Pfizer Covid-19 Vaccine Bivalent Booster 37yr & up 05/17/2021   Pneumococcal Polysaccharide-23 02/19/2020, 11/19/2020   Tdap 06/29/2006, 11/22/2016   Typhoid Live 11/22/2016   Yellow Fever 11/22/2016   Zoster Recombinat (Shingrix) 03/24/2020, 05/24/2020   Zoster, Live 06/23/2015      Flu Vaccine status: Up to date  Pneumococcal vaccine status: Up to date  Covid-19 vaccine status: Completed vaccines  Qualifies for Shingles Vaccine? Yes   Zostavax completed Yes   Shingrix Completed?: Yes  Screening Tests Health Maintenance  Topic Date Due   COVID-19 Vaccine (6 - Pfizer series) 07/20/2022 (Originally 09/17/2021)   INFLUENZA VACCINE  11/13/2022 (Originally 03/15/2022)    Medicare Annual Wellness (AWV)  07/05/2023   COLONOSCOPY (Pts 45-47yrInsurance coverage will need to be confirmed)  08/25/2025   Pneumonia Vaccine 6564Years old  Completed   Hepatitis C Screening  Completed   Zoster Vaccines- Shingrix  Completed   HPV VACCINES  Aged Out    Health Maintenance  There are no preventive care reminders to display for this patient.   Colorectal cancer screening: Type of screening: Colonoscopy. Completed 08/25/20. Repeat every 5 years  Lung Cancer Screening: (Low Dose CT Chest recommended if Age 68-80ears, 30 pack-year currently smoking OR have quit w/in 15years.) does not qualify.     Additional Screening:  Hepatitis C Screening: does qualify; Completed 12/06/16  Vision Screening: Recommended annual ophthalmology exams for early detection of glaucoma and other disorders of the eye. Is the patient up to date with their annual eye exam?  Yes  Who is the provider or what is the name of the office in which the patient attends annual eye exams? Dr Sabra Heck If pt is not established with a provider, would they like to be referred to a provider to establish care? No .   Dental Screening: Recommended annual dental exams for proper oral hygiene  Community Resource Referral / Chronic Care Management:  CRR required this visit?  No   CCM required this visit?  No      Plan:     I have personally reviewed and noted the following in the patient's chart:   Medical and social history Use of alcohol, tobacco or illicit drugs  Current medications and supplements including opioid prescriptions. Patient is not currently taking opioid prescriptions. Functional ability and status Nutritional status Physical activity Advanced directives List of other physicians Hospitalizations, surgeries, and ER visits in previous 12 months Vitals Screenings to include cognitive, depression, and falls Referrals and appointments  In addition, I have reviewed and discussed  with patient certain preventive protocols, quality metrics, and best practice recommendations. A written personalized care plan for preventive services as well as general preventive health recommendations were provided to patient.     Criselda Peaches, LPN   [id number]/94/[id number]   Nurse Notes: None

## 2022-07-06 DIAGNOSIS — J3081 Allergic rhinitis due to animal (cat) (dog) hair and dander: Secondary | ICD-10-CM | POA: Diagnosis not present

## 2022-07-06 DIAGNOSIS — J3089 Other allergic rhinitis: Secondary | ICD-10-CM | POA: Diagnosis not present

## 2022-07-06 DIAGNOSIS — J301 Allergic rhinitis due to pollen: Secondary | ICD-10-CM | POA: Diagnosis not present

## 2022-07-11 ENCOUNTER — Ambulatory Visit (HOSPITAL_BASED_OUTPATIENT_CLINIC_OR_DEPARTMENT_OTHER): Payer: PPO | Admitting: Cardiology

## 2022-07-11 ENCOUNTER — Encounter (HOSPITAL_BASED_OUTPATIENT_CLINIC_OR_DEPARTMENT_OTHER): Payer: Self-pay | Admitting: Cardiology

## 2022-07-11 VITALS — BP 116/62 | HR 57 | Ht 69.5 in | Wt 170.6 lb

## 2022-07-11 DIAGNOSIS — I2510 Atherosclerotic heart disease of native coronary artery without angina pectoris: Secondary | ICD-10-CM

## 2022-07-11 DIAGNOSIS — J3081 Allergic rhinitis due to animal (cat) (dog) hair and dander: Secondary | ICD-10-CM | POA: Diagnosis not present

## 2022-07-11 DIAGNOSIS — I493 Ventricular premature depolarization: Secondary | ICD-10-CM

## 2022-07-11 DIAGNOSIS — E7800 Pure hypercholesterolemia, unspecified: Secondary | ICD-10-CM

## 2022-07-11 DIAGNOSIS — R002 Palpitations: Secondary | ICD-10-CM | POA: Diagnosis not present

## 2022-07-11 DIAGNOSIS — J301 Allergic rhinitis due to pollen: Secondary | ICD-10-CM | POA: Diagnosis not present

## 2022-07-11 DIAGNOSIS — J3089 Other allergic rhinitis: Secondary | ICD-10-CM | POA: Diagnosis not present

## 2022-07-11 NOTE — Patient Instructions (Signed)
Medication Instructions:  The current medical regimen is effective;  continue present plan and medications.   *If you need a refill on your cardiac medications before your next appointment, please call your pharmacy*   Lab Work: None   Testing/Procedures: None   Follow-Up: At [facility], you and your health needs are our priority.  As part of our continuing mission to provide you with exceptional heart care, we have created designated Provider Care Teams.  These Care Teams include your primary Cardiologist (physician) and Advanced Practice Providers (APPs -  Physician Assistants and Nurse Practitioners) who all work together to provide you with the care you need, when you need it.  We recommend signing up for the patient portal called "MyChart".  Sign up information is provided on this After Visit Summary.  MyChart is used to connect with patients for Virtual Visits (Telemedicine).  Patients are able to view lab/test results, encounter notes, upcoming appointments, etc.  Non-urgent messages can be sent to your provider as well.   To learn more about what you can do with MyChart, go to [email or web address].    Your next appointment:   1 year(s)  The format for your next appointment:   In Person  Provider:   Buford Dresser, MD    Other Instructions None

## 2022-07-11 NOTE — Progress Notes (Signed)
Cardiology Office Note:    Date:  07/11/2022   ID:  Stanley, Bennett March 03, 1956, MRN [id number]  PCP:  Isaac Bliss, Rayford Halsted, MD  Cardiologist:  Buford Dresser, MD  CC: follow up  History of Present Illness:    Stanley Bennett is a 66 y.o. male with a hx of PVCs, hyperlipidemia, family history of heart disease who is seen for follow up. I initially met him 10/29/19 as a new consult at the request of No ref. provider found for the evaluation and management of chest discomfort and variable heart rate.   In 08/2021 he had an acute decreased heart rate to the 30's. He was sitting in a chair when his heart rate dropped. It elevated back to normal after about 15 minutes. He reported he was sick that week. He had a syncopal episode the previous Saturday that he associated with dehydration. He was standing up when he suddenly felt dizzy and woke up on the ground. He felt better after drinking water. He had occasional episodes of positional dizziness starting several weeks prior.  Today, he reports not feeling any recurring episodes of palpitations since mid-September. He has not been able to determine any clear triggers or correlations. He did not start the diltiazem. Also he has never needed to use his nitroglycerin in the past 2 years.  He continues to exercise routinely. He denies feeling physically limited. At mid-day he may feel fatigued and take a nap, but he otherwise feels well.  He denies any chest pain, shortness of breath, or peripheral edema. No lightheadedness, headaches, syncope, orthopnea, or PND.   Past Medical History:  Diagnosis Date   Allergy    seasonal allergies   Arthritis    generalized   Basal cell carcinoma    scalp   GERD (gastroesophageal reflux disease)    on meds   Gout    on gout   Hyperlipidemia    on meds    Past Surgical History:  Procedure Laterality Date   APPENDECTOMY     BASAL CELL CARCINOMA EXCISION     CERVICAL DISC  SURGERY     COLONOSCOPY  05/2017   MAC-suprep(exc)-polyps   TONSILLECTOMY     UPPER GASTROINTESTINAL ENDOSCOPY  05/2017   normal   VASECTOMY     WISDOM TOOTH EXTRACTION      Current Medications: Current Outpatient Medications on File Prior to Visit  Medication Sig   allopurinol (ZYLOPRIM) 300 MG tablet TAKE 1 TABLET(300 MG) BY MOUTH DAILY AS DIRECTED   aspirin EC 81 MG tablet Take 1 tablet (81 mg total) by mouth daily.   EPINEPHrine 0.3 mg/0.3 mL IJ SOAJ injection SMARTSIG:Injection As Directed   fluticasone (FLONASE) 50 MCG/ACT nasal spray Place 1 spray into both nostrils 2 (two) times daily.    loratadine (CLARITIN) 10 MG tablet Take 10 mg by mouth daily.   pantoprazole (PROTONIX) 20 MG tablet TAKE ONE TABLET BY MOUTH DAILY BEFORE BREAKFAST   PRESCRIPTION MEDICATION Allergy shots every other week   rosuvastatin (CRESTOR) 20 MG tablet TAKE ONE TABLET BY MOUTH DAILY   tadalafil (CIALIS) 5 MG tablet Take 1 tablet (5 mg total) by mouth daily.   Vitamin D, Ergocalciferol, (DRISDOL) 1.25 MG (50000 UNIT) CAPS capsule Take 1 capsule (50,000 Units total) by mouth every 7 (seven) days.   No current facility-administered medications on file prior to visit.     Allergies:   Teriflunomide and Tamiflu [oseltamivir]   Social History   Tobacco Use  Smoking status: Never   Smokeless tobacco: Never  Vaping Use   Vaping Use: Never used  Substance Use Topics   Alcohol use: Yes    Alcohol/week: 5.0 standard drinks of alcohol    Types: 5 Standard drinks or equivalent per week    Comment: 5   Drug use: No    Family History: family history includes Arthritis in his father and mother; Diabetes in his sister; Heart disease in his father; Kidney failure in his cousin and cousin; Other in his brother; Ovarian cancer in his sister; Prostate cancer in his father; Prostate cancer (age of onset: 19) in his brother. There is no history of Colon cancer, Esophageal cancer, Rectal cancer, Colon polyps,  or Stomach cancer.  father had CABG at 27 years old. Sister just had a stent placed in her late 51s. They were both less active than he is.  ROS:   Please see the history of present illness.   (+) Fatigue Additional pertinent ROS otherwise unremarkable.  EKGs/Labs/Other Studies Reviewed:    The following studies were reviewed today:  Echo  12/27/2021:  1. Left ventricular ejection fraction, by estimation, is 65 to 70%. Left  ventricular ejection fraction by 3D volume is 76 %. The left ventricle has  normal function. The left ventricle has no regional wall motion  abnormalities. Left ventricular diastolic   parameters are consistent with Grade I diastolic dysfunction (impaired  relaxation). The average left ventricular global longitudinal strain is  -22.8 %. The global longitudinal strain is normal.   2. Right ventricular systolic function is normal. The right ventricular  size is mildly enlarged.   3. Left atrial size was severely dilated.   4. Right atrial size was severely dilated.   5. The mitral valve is normal in structure. Trivial mitral valve  regurgitation. No evidence of mitral stenosis.   6. The aortic valve is tricuspid. Aortic valve regurgitation is trivial.  No aortic stenosis is present.   7. The inferior vena cava is normal in size with greater than 50%  respiratory variability, suggesting right atrial pressure of 3 mmHg.   Monitor 12/18/19 14 days of data recorded on Zio monitor. Patient had a min HR of 39 bpm, max HR of 148 bpm, and avg HR of 58 bpm. Predominant underlying rhythm was Sinus Rhythm. No VT, atrial fibrillation, high degree block, or pauses noted. Isolated atrial ectopy was rare (<1%), and isolated ventricular ectopy was occasional (2.6%). There were 5 triggered events, which were sinus with/without PVCs. 4 Supraventricular Tachycardia runs occurred, the run with the fastest interval lasting 5 beats with a max rate of 148 bpm, the longest lasting 9 beats with  an avg rate of 107 bpm.  CT FFR 12/04/19 FINDINGS: FFRct analysis was performed on the original cardiac CT angiogram dataset. Diagrammatic representation of the FFRct analysis is provided in a separate PDF document in PACS. This dictation was created using the PDF document and an interactive 3D model of the results. 3D model is not available in the EMR/PACS. Normal FFR range is >0.80.   1. Left Main:  No significant stenosis. FFR = 0.98   2. LAD: No significant stenosis. Proximal FFR = 0.95, Mid FFR =0.89, Distal FFR = 0.84 3. LCX: No significant stenosis. Proximal FFR = 0.96, Distal FFR = 0.94 4. RCA: No significant stenosis. Proximal FFR = 0.98, Mid FFR =0.95, Distal FFR = 0.92   IMPRESSION: 1.  CT FFR analysis did not show any significant stenosis.  Cardiac  CT 12/03/19 IMPRESSION: 1. Mild nonobstructive CAD, CADRADS = 2. However, there is one lesion in the mid LAD that cannot be fully visualized due to calcium and may have higher degree stenosis. CT FFR will be performed and reported separately.   2. Coronary calcium score of 395. This was 79th percentile for age and sex matched control.   3. Normal coronary origin with right dominance.  FFR negative for significant stenosis.   EKG:  EKG was personally reviewed. 07/11/2022: not ordered today 12/16/2021: EKG was not ordered. 10/19/21: Sinus bradycardia, rate 54 bpm 10/29/19: sinus bradycardia at 53 bpm  Recent Labs: 07/25/2021: Magnesium 2.0 04/13/2022: ALT 24; BUN 15; Creatinine, Ser 0.85; Hemoglobin 15.5; Platelets 204.0; Potassium 4.6; Sodium 139; TSH 5.01   Recent Lipid Panel    Component Value Date/Time   CHOL 157 04/13/2022 0710   TRIG 62.0 04/13/2022 0710   HDL 66.40 04/13/2022 0710   CHOLHDL 2 04/13/2022 0710   VLDL 12.4 04/13/2022 0710   LDLCALC 78 04/13/2022 0710    Physical Exam:    VS:  BP 116/62   Pulse (!) 57   Ht 5' 9.5" (1.765 m)   Wt 170 lb 9.6 oz (77.4 kg)   SpO2 97%   BMI 24.83 kg/m      Wt Readings from Last 3 Encounters:  07/11/22 170 lb 9.6 oz (77.4 kg)  07/04/22 166 lb (75.3 kg)  04/19/22 171 lb 11.2 oz (77.9 kg)    GEN: Well nourished, well developed in no acute distress HEENT: Normal, moist mucous membranes NECK: No JVD CARDIAC: regular rhythm, normal S1 and S2, no rubs or gallops. No murmur. VASCULAR: Radial and DP pulses 2+ bilaterally. No carotid bruits RESPIRATORY:  Clear to auscultation without rales, wheezing or rhonchi  ABDOMEN: Soft, non-tender, non-distended MUSCULOSKELETAL:  Ambulates independently SKIN: Warm and dry, no edema NEUROLOGIC:  Alert and oriented x 3. No focal neuro deficits noted. PSYCHIATRIC:  Normal affect    ASSESSMENT:    1. Heart palpitations   2. PVC (premature ventricular contraction)   3. Nonocclusive coronary atherosclerosis of native coronary artery   4. Pure hypercholesterolemia    PLAN:    Palpitations PVCs, frequent History of Syncope -PVC burden higher on most recent monitor, >10%. -feels much better off of metoprolol. Never started diltiazem -as he is asymptomatic and not limited in his activity currently, would hold on nodal agents. If he develops worsening symptoms would need to re-evaluate management of his PVCs -echo with atrial enlargement, otherwise unremarkable -reviewed [employer] tracings  Nonobstructive CAD with prior chest pain:  -no further symptoms -tolerating aspirin 81 mg daily -tolerating rosuvastatin 20 mg daily -counseled on red flag warning signs that need immediate medical attention. Has never used nitro, removed from list today, declines refill but will contact me if he develops symptoms   Hypercholesterolemia: -tolerating rosuvastatin 20 mg daily -lipids 02/19/20: Tchol 157, HDL 68, LDL 75, TG 65 -lipids 03/01/21: Tchol 176, HDL 85, LDL 81, TG 47 -lipids 04/13/22: Tchol 157, HDL 66, LDL 78, TG 62 -Goal LDL <70. Tolerating current meds, feels well, no changes today   Family history of  heart disease: secondary prevention as below  Cardiac risk counseling and prevention recommendations: -recommend heart healthy/Mediterranean diet, with whole grains, fruits, vegetable, fish, lean meats, nuts, and olive oil. Limit salt. -recommend moderate walking, 3-5 times/week for 30-50 minutes each session. Aim for at least 150 minutes.week. Goal should be pace of 3 miles/hours, or walking 1.5 miles in 30 minutes -recommend  avoidance of tobacco products. Avoid excess alcohol.  Plan for follow up: 1 year or sooner as needed  Buford Dresser, MD, PhD, [facility]    Medication Adjustments/Labs and Tests Ordered: Current medicines are reviewed at length with the patient today.  Concerns regarding medicines are outlined above.   No orders of the defined types were placed in this encounter.  No orders of the defined types were placed in this encounter.  Patient Instructions  Medication Instructions:  The current medical regimen is effective;  continue present plan and medications.   *If you need a refill on your cardiac medications before your next appointment, please call your pharmacy*   Lab Work: None   Testing/Procedures: None   Follow-Up: At [facility], you and your health needs are our priority.  As part of our continuing mission to provide you with exceptional heart care, we have created designated Provider Care Teams.  These Care Teams include your primary Cardiologist (physician) and Advanced Practice Providers (APPs -  Physician Assistants and Nurse Practitioners) who all work together to provide you with the care you need, when you need it.  We recommend signing up for the patient portal called "MyChart".  Sign up information is provided on this After Visit Summary.  MyChart is used to connect with patients for Virtual Visits (Telemedicine).  Patients are able to view lab/test results, encounter notes, upcoming appointments, etc.   Non-urgent messages can be sent to your provider as well.   To learn more about what you can do with MyChart, go to [email or web address].    Your next appointment:   1 year(s)  The format for your next appointment:   In Person  Provider:   Buford Dresser, MD    Other Instructions None        I,Mathew Stumpf,acting as a scribe for Buford Dresser, MD.,have documented all relevant documentation on the behalf of Buford Dresser, MD,as directed by  Buford Dresser, MD while in the presence of Buford Dresser, MD.  I, Buford Dresser, MD, have reviewed all documentation for this visit. The documentation on 07/11/22 for the exam, diagnosis, procedures, and orders are all accurate and complete.   Signed, Buford Dresser, MD PhD 07/11/2022  [place]

## 2022-07-18 DIAGNOSIS — J3089 Other allergic rhinitis: Secondary | ICD-10-CM | POA: Diagnosis not present

## 2022-07-18 DIAGNOSIS — J301 Allergic rhinitis due to pollen: Secondary | ICD-10-CM | POA: Diagnosis not present

## 2022-07-18 DIAGNOSIS — J3081 Allergic rhinitis due to animal (cat) (dog) hair and dander: Secondary | ICD-10-CM | POA: Diagnosis not present

## 2022-07-19 ENCOUNTER — Other Ambulatory Visit: Payer: PPO

## 2022-07-19 DIAGNOSIS — D509 Iron deficiency anemia, unspecified: Secondary | ICD-10-CM

## 2022-07-20 ENCOUNTER — Other Ambulatory Visit: Payer: PPO

## 2022-07-20 DIAGNOSIS — D649 Anemia, unspecified: Secondary | ICD-10-CM

## 2022-07-20 LAB — IRON AND TIBC
Iron Saturation: 42 % (ref 15–55)
Iron: 100 ug/dL (ref 38–169)
Total Iron Binding Capacity: 239 ug/dL — ABNORMAL LOW (ref 250–450)
UIBC: 139 ug/dL (ref 111–343)

## 2022-07-22 LAB — SPECIMEN STATUS REPORT

## 2022-07-22 LAB — FERRITIN: Ferritin: 106 ng/mL (ref 30–400)

## 2022-07-25 DIAGNOSIS — J301 Allergic rhinitis due to pollen: Secondary | ICD-10-CM | POA: Diagnosis not present

## 2022-07-25 DIAGNOSIS — J3089 Other allergic rhinitis: Secondary | ICD-10-CM | POA: Diagnosis not present

## 2022-07-25 DIAGNOSIS — J3081 Allergic rhinitis due to animal (cat) (dog) hair and dander: Secondary | ICD-10-CM | POA: Diagnosis not present

## 2022-08-02 DIAGNOSIS — J301 Allergic rhinitis due to pollen: Secondary | ICD-10-CM | POA: Diagnosis not present

## 2022-08-02 DIAGNOSIS — J3081 Allergic rhinitis due to animal (cat) (dog) hair and dander: Secondary | ICD-10-CM | POA: Diagnosis not present

## 2022-08-02 DIAGNOSIS — J3089 Other allergic rhinitis: Secondary | ICD-10-CM | POA: Diagnosis not present

## 2022-08-18 DIAGNOSIS — J3081 Allergic rhinitis due to animal (cat) (dog) hair and dander: Secondary | ICD-10-CM | POA: Diagnosis not present

## 2022-08-18 DIAGNOSIS — J301 Allergic rhinitis due to pollen: Secondary | ICD-10-CM | POA: Diagnosis not present

## 2022-08-18 DIAGNOSIS — J3089 Other allergic rhinitis: Secondary | ICD-10-CM | POA: Diagnosis not present

## 2022-08-24 DIAGNOSIS — J3081 Allergic rhinitis due to animal (cat) (dog) hair and dander: Secondary | ICD-10-CM | POA: Diagnosis not present

## 2022-08-24 DIAGNOSIS — J301 Allergic rhinitis due to pollen: Secondary | ICD-10-CM | POA: Diagnosis not present

## 2022-08-24 DIAGNOSIS — J3089 Other allergic rhinitis: Secondary | ICD-10-CM | POA: Diagnosis not present

## 2022-08-29 DIAGNOSIS — J3089 Other allergic rhinitis: Secondary | ICD-10-CM | POA: Diagnosis not present

## 2022-08-29 DIAGNOSIS — J3081 Allergic rhinitis due to animal (cat) (dog) hair and dander: Secondary | ICD-10-CM | POA: Diagnosis not present

## 2022-08-29 DIAGNOSIS — J301 Allergic rhinitis due to pollen: Secondary | ICD-10-CM | POA: Diagnosis not present

## 2022-09-01 NOTE — Progress Notes (Signed)
[place] Phone: [phone] Subjective:   Stanley Bennett, am serving as a scribe for Dr. Hulan Saas.  I'm seeing this patient by the request  of:  Stanley Bennett, Stanley Halsted, MD  CC: right elbow pain   TYO:[id number]  Stanley Bennett is a 67 y.o. male coming in with complaint of R elbow pain. Last seen for piriformis syndrome in July 2023. Hx of cervical DDD. Patient states that he has sharp pain in the lateral epicondyle. Patient likes to play the guitar and this causes an increase his pain. Picking up coffee mug and after long walk bending the elbow will hurt. Prior to the pain his elbow and forearm would be numb. Michela Pitcher that a decade ago he fell of a ladder on that elbow.   MRI cervical October 2022 IMPRESSION: 1. C5-6 ACDF with widely patent spinal canal. 2. Progression to mild spinal canal stenosis at C3-4.    Past Medical History:  Diagnosis Date   Allergy    seasonal allergies   Arthritis    generalized   Basal cell carcinoma    scalp   GERD (gastroesophageal reflux disease)    on meds   Gout    on gout   Hyperlipidemia    on meds   Past Surgical History:  Procedure Laterality Date   APPENDECTOMY     BASAL CELL CARCINOMA EXCISION     CERVICAL DISC SURGERY     COLONOSCOPY  05/2017   MAC-suprep(exc)-polyps   TONSILLECTOMY     UPPER GASTROINTESTINAL ENDOSCOPY  05/2017   normal   VASECTOMY     WISDOM TOOTH EXTRACTION     Social History   Socioeconomic History   Marital status: Married    Spouse name: [name]   Number of children: 3   Years of education: Not on file   Highest education level: Bachelor's degree (e.g., BA, AB, BS)  Occupational History   Occupation: Development worker, international aid    Comment: retired  Tobacco Use   Smoking status: Never   Smokeless tobacco: Never  Vaping Use   Vaping Use: Never used  Substance and Sexual Activity   Alcohol use: Yes     Alcohol/week: 5.0 standard drinks of alcohol    Types: 5 Standard drinks or equivalent per week    Comment: 5   Drug use: Bennett   Sexual activity: Yes    Partners: Female  Other Topics Concern   Not on file  Social History Narrative   Work or School: [place], 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      Home Situation: lives with wife      Spiritual Beliefs: Catholic      Lifestyle: regular exercise and healthy diet   Social Determinants of Health   Financial Resource Strain: Low Risk  (07/04/2022)   Overall Financial Resource Strain (CARDIA)    Difficulty of Paying Living Expenses: Not hard at all  Food Insecurity: Bennett Food Insecurity (07/04/2022)   Hunger Vital Sign    Worried About Running Out of Food in the Last Year: Never true    [place] in the Last Year: Never true  Transportation Needs: Bennett Transportation Needs (07/04/2022)   PRAPARE - Hydrologist (Medical): Bennett    Lack of Transportation (Non-Medical): Bennett  Physical Activity: Sufficiently Active (07/04/2022)   Exercise Vital Sign    Days of Exercise per Week: 6 days  Minutes of Exercise per Session: 60 min  Stress: Bennett Stress Concern Present (07/04/2022)   [place]    Feeling of Stress : Not at all  Social Connections: [place] (07/04/2022)   Social Connection and Isolation Panel [NHANES]    Frequency of Communication with Friends and Family: More than three times a week    Frequency of Social Gatherings with Friends and Family: More than three times a week    Attends Religious Services: More than 4 times per year    Active Member of [employer] or Organizations: Yes    Attends Music therapist: More than 4 times per year    Marital Status: Married   Allergies  Allergen Reactions   Teriflunomide Other (See Comments)   Tamiflu [Oseltamivir] Rash    Mild rash 2018   Family History  Problem  Relation Age of Onset   Arthritis Mother        osteo   Arthritis Father        osteo   Heart disease Father        (pneumonia cause of death 45 yrs)   Prostate cancer Father    Diabetes Sister    Ovarian cancer Sister    Other Brother        celiac sprue   Prostate cancer Brother 93   Kidney failure Cousin        pat cousin   Kidney failure Cousin        pat cousin   Colon cancer Neg Hx    Esophageal cancer Neg Hx    Rectal cancer Neg Hx    Colon polyps Neg Hx    Stomach cancer Neg Hx      Current Outpatient Medications (Cardiovascular):    EPINEPHrine 0.3 mg/0.3 mL IJ SOAJ injection, SMARTSIG:Injection As Directed   rosuvastatin (CRESTOR) 20 MG tablet, TAKE ONE TABLET BY MOUTH DAILY   tadalafil (CIALIS) 5 MG tablet, Take 1 tablet (5 mg total) by mouth daily.  Current Outpatient Medications (Respiratory):    fluticasone (FLONASE) 50 MCG/ACT nasal spray, Place 1 spray into both nostrils 2 (two) times daily.    loratadine (CLARITIN) 10 MG tablet, Take 10 mg by mouth daily.  Current Outpatient Medications (Analgesics):    allopurinol (ZYLOPRIM) 300 MG tablet, TAKE 1 TABLET(300 MG) BY MOUTH DAILY AS DIRECTED   aspirin EC 81 MG tablet, Take 1 tablet (81 mg total) by mouth daily.   Current Outpatient Medications (Other):    pantoprazole (PROTONIX) 20 MG tablet, TAKE ONE TABLET BY MOUTH DAILY BEFORE BREAKFAST   PRESCRIPTION MEDICATION, Allergy shots every other week   Vitamin D, Ergocalciferol, (DRISDOL) 1.25 MG (50000 UNIT) CAPS capsule, Take 1 capsule (50,000 Units total) by mouth every 7 (seven) days.   Reviewed prior external information including notes and imaging from  primary care provider As well as notes that were available from care everywhere and other healthcare systems.  Past medical history, social, surgical and family history all reviewed in electronic medical record.  Bennett pertanent information unless stated regarding to the chief complaint.   Review of  Systems:  Bennett headache, visual changes, nausea, vomiting, diarrhea, constipation, dizziness, abdominal pain, skin rash, fevers, chills, night sweats, weight loss, swollen lymph nodes, body aches, joint swelling, chest pain, shortness of breath, mood changes. POSITIVE muscle aches  Objective  Blood pressure 102/70, pulse 62, height 5' 9.5" (1.765 m), weight 170 lb (77.1 kg), SpO2 98 %.  General: Bennett apparent distress alert and oriented x3 mood and affect normal, dressed appropriately.  HEENT: Pupils equal, extraocular movements intact  Respiratory: Patient's speak in full sentences and does not appear short of breath  Cardiovascular: Bennett lower extremity edema, non tender, Bennett erythema  Elbow exam shows mild tenderness over the lateral epicondylar area but patient does not have any pain with resisted extension patient does have some positive Tinel's sign noted of the wrist.  Some mild arthritic changes noted.  Some limited range of motion of the neck noted.  Limited muscular skeletal ultrasound was performed and interpreted by Hulan Saas, M  Limited ultrasound shows patient does have nothing significant at the common extensor tendon near the lateral epicondylar area.  Patient has some very mild deformity noted of the radial head but likely within normal variant. Patient though does have significant enlargement with hypoechoic changes of the median nerve consistent with carpal tunnel. Impression: Carpal tunnel syndrome    Impression and Recommendations:    The above documentation has been reviewed and is accurate and complete Stanley Pulley, DO

## 2022-09-05 DIAGNOSIS — D225 Melanocytic nevi of trunk: Secondary | ICD-10-CM | POA: Diagnosis not present

## 2022-09-05 DIAGNOSIS — L858 Other specified epidermal thickening: Secondary | ICD-10-CM | POA: Diagnosis not present

## 2022-09-05 DIAGNOSIS — D1801 Hemangioma of skin and subcutaneous tissue: Secondary | ICD-10-CM | POA: Diagnosis not present

## 2022-09-05 DIAGNOSIS — Z85828 Personal history of other malignant neoplasm of skin: Secondary | ICD-10-CM | POA: Diagnosis not present

## 2022-09-05 DIAGNOSIS — L570 Actinic keratosis: Secondary | ICD-10-CM | POA: Diagnosis not present

## 2022-09-05 DIAGNOSIS — L111 Transient acantholytic dermatosis [Grover]: Secondary | ICD-10-CM | POA: Diagnosis not present

## 2022-09-05 DIAGNOSIS — D2262 Melanocytic nevi of left upper limb, including shoulder: Secondary | ICD-10-CM | POA: Diagnosis not present

## 2022-09-05 DIAGNOSIS — L814 Other melanin hyperpigmentation: Secondary | ICD-10-CM | POA: Diagnosis not present

## 2022-09-06 ENCOUNTER — Encounter: Payer: Self-pay | Admitting: Family Medicine

## 2022-09-06 ENCOUNTER — Ambulatory Visit: Payer: Self-pay

## 2022-09-06 ENCOUNTER — Ambulatory Visit (INDEPENDENT_AMBULATORY_CARE_PROVIDER_SITE_OTHER): Payer: PPO | Admitting: Family Medicine

## 2022-09-06 VITALS — BP 102/70 | HR 62 | Ht 69.5 in | Wt 170.0 lb

## 2022-09-06 DIAGNOSIS — G5601 Carpal tunnel syndrome, right upper limb: Secondary | ICD-10-CM

## 2022-09-06 DIAGNOSIS — M25521 Pain in right elbow: Secondary | ICD-10-CM | POA: Diagnosis not present

## 2022-09-06 DIAGNOSIS — M5030 Other cervical disc degeneration, unspecified cervical region: Secondary | ICD-10-CM

## 2022-09-06 NOTE — Assessment & Plan Note (Signed)
We discussed the anatomy involved, and that carpal tunnel syndrome primarily involves the median nerve, and this typically affects digits one through 3.   We also discussed that mild cases of carpal tunnel syndrome are often improved with night splints.  If symptoms persist it is very reasonable to consider a carpal tunnel injection.   If the patient does have moderate to severe carpal tunnel syndrome based on NCV, then it is certainly reasonable to consider surgical consultation for definitive management possible carpal tunnel release.  We also discussed his severe carpal tunnel syndrome can lead to permanent nerve impairment even if released. At this point, the patient like to proceed conservatively. Patient will try the bracing regularly as well as consider the injections if not making improvement.

## 2022-09-06 NOTE — Patient Instructions (Signed)
Carpal tunnel ex Brace day and night for 2 weeks, then at night for 2 weeks See me in 4-6 weeks

## 2022-09-06 NOTE — Assessment & Plan Note (Signed)
Within the differential for some more radicular symptoms.  We will continue to monitor.  Does have some very mild signs of lateral epicondylitis that could be contributing but I think it is more secondary to the carpal tunnel.  Starting with home exercises and follow-up again in 6 to 8 weeks

## 2022-09-07 DIAGNOSIS — J301 Allergic rhinitis due to pollen: Secondary | ICD-10-CM | POA: Diagnosis not present

## 2022-09-07 DIAGNOSIS — J3089 Other allergic rhinitis: Secondary | ICD-10-CM | POA: Diagnosis not present

## 2022-09-07 DIAGNOSIS — J3081 Allergic rhinitis due to animal (cat) (dog) hair and dander: Secondary | ICD-10-CM | POA: Diagnosis not present

## 2022-09-15 DIAGNOSIS — J301 Allergic rhinitis due to pollen: Secondary | ICD-10-CM | POA: Diagnosis not present

## 2022-09-15 DIAGNOSIS — J3089 Other allergic rhinitis: Secondary | ICD-10-CM | POA: Diagnosis not present

## 2022-09-15 DIAGNOSIS — J3081 Allergic rhinitis due to animal (cat) (dog) hair and dander: Secondary | ICD-10-CM | POA: Diagnosis not present

## 2022-09-18 ENCOUNTER — Other Ambulatory Visit: Payer: Self-pay | Admitting: Internal Medicine

## 2022-09-18 DIAGNOSIS — N401 Enlarged prostate with lower urinary tract symptoms: Secondary | ICD-10-CM

## 2022-09-21 ENCOUNTER — Ambulatory Visit: Payer: PPO | Admitting: Family Medicine

## 2022-09-23 DIAGNOSIS — J301 Allergic rhinitis due to pollen: Secondary | ICD-10-CM | POA: Diagnosis not present

## 2022-09-23 DIAGNOSIS — J3081 Allergic rhinitis due to animal (cat) (dog) hair and dander: Secondary | ICD-10-CM | POA: Diagnosis not present

## 2022-09-23 DIAGNOSIS — J3089 Other allergic rhinitis: Secondary | ICD-10-CM | POA: Diagnosis not present

## 2022-09-29 ENCOUNTER — Other Ambulatory Visit: Payer: Self-pay | Admitting: Internal Medicine

## 2022-09-29 DIAGNOSIS — J3081 Allergic rhinitis due to animal (cat) (dog) hair and dander: Secondary | ICD-10-CM | POA: Diagnosis not present

## 2022-09-29 DIAGNOSIS — J3089 Other allergic rhinitis: Secondary | ICD-10-CM | POA: Diagnosis not present

## 2022-09-29 DIAGNOSIS — J301 Allergic rhinitis due to pollen: Secondary | ICD-10-CM | POA: Diagnosis not present

## 2022-10-04 NOTE — Progress Notes (Unsigned)
[place] Phone: [phone] Subjective:   Stanley Bennett, am serving as a scribe for Dr. Hulan Saas.  I'm seeing this patient by the request  of:  Isaac Bliss, Rayford Halsted, MD  CC:   [id number]  09/06/2022 Within the differential for some more radicular symptoms.  We will continue to monitor.  Does have some very mild signs of lateral epicondylitis that could be contributing but I think it is more secondary to the carpal tunnel.  Starting with home exercises and follow-up again in 6 to 8 weeks     We discussed the anatomy involved, and that carpal tunnel syndrome primarily involves the median nerve, and this typically affects digits one through 3.    We also discussed that mild cases of carpal tunnel syndrome are often improved with night splints.  If symptoms persist it is very reasonable to consider a carpal tunnel injection.    If the patient does have moderate to severe carpal tunnel syndrome based on NCV, then it is certainly reasonable to consider surgical consultation for definitive management possible carpal tunnel release.  We also discussed his severe carpal tunnel syndrome can lead to permanent nerve impairment even if released. At this point, the patient like to proceed conservatively. Patient will try the bracing regularly as well as consider the injections if not making improvement.  Updated 10/05/2022 Stanley Bennett is a 67 y.o. male coming in with complaint of neck and R wrist pain. Patient states that his R elbow and wrist pain have not improved. Feels numbness in forearm and notes a thickness feeling in his fingers. Performed bracing regimen and that did not provide him with any relief.   Neck pain has improved. Has stiffness at times.       Past Medical History:  Diagnosis Date   Allergy    seasonal allergies   Arthritis    generalized   Basal cell carcinoma    scalp   GERD  (gastroesophageal reflux disease)    on meds   Gout    on gout   Hyperlipidemia    on meds   Past Surgical History:  Procedure Laterality Date   APPENDECTOMY     BASAL CELL CARCINOMA EXCISION     CERVICAL DISC SURGERY     COLONOSCOPY  05/2017   MAC-suprep(exc)-polyps   TONSILLECTOMY     UPPER GASTROINTESTINAL ENDOSCOPY  05/2017   normal   VASECTOMY     WISDOM TOOTH EXTRACTION     Social History   Socioeconomic History   Marital status: Married    Spouse name: [name]   Number of children: 3   Years of education: Not on file   Highest education level: Bachelor's degree (e.g., BA, AB, BS)  Occupational History   Occupation: Development worker, international aid    Comment: retired  Tobacco Use   Smoking status: Never   Smokeless tobacco: Never  Vaping Use   Vaping Use: Never used  Substance and Sexual Activity   Alcohol use: Yes    Alcohol/week: 5.0 standard drinks of alcohol    Types: 5 Standard drinks or equivalent per week    Comment: 5   Drug use: Bennett   Sexual activity: Yes    Partners: Female  Other Topics Concern   Not on file  Social History Narrative   Work or School: [employer], 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      Home Situation: lives with wife  Spiritual Beliefs: Catholic      Lifestyle: regular exercise and healthy diet   Social Determinants of Health   Financial Resource Strain: Low Risk  (07/04/2022)   Overall Financial Resource Strain (CARDIA)    Difficulty of Paying Living Expenses: Not hard at all  Food Insecurity: Bennett Food Insecurity (07/04/2022)   Hunger Vital Sign    Worried About Running Out of Food in the Last Year: Never true    Ran Out of Food in the Last Year: Never true  Transportation Needs: Bennett Transportation Needs (07/04/2022)   PRAPARE - Hydrologist (Medical): Bennett    Lack of Transportation (Non-Medical): Bennett  Physical Activity: Sufficiently Active (07/04/2022)   Exercise Vital Sign    Days of  Exercise per Week: 6 days    Minutes of Exercise per Session: 60 min  Stress: Bennett Stress Concern Present (07/04/2022)   [place]    Feeling of Stress : Not at all  Social Connections: [place] (07/04/2022)   Social Connection and Isolation Panel [NHANES]    Frequency of Communication with Friends and Family: More than three times a week    Frequency of Social Gatherings with Friends and Family: More than three times a week    Attends Religious Services: More than 4 times per year    Active Member of [employer] or Organizations: Yes    Attends Music therapist: More than 4 times per year    Marital Status: Married   Allergies  Allergen Reactions   Teriflunomide Other (See Comments)   Tamiflu [Oseltamivir] Rash    Mild rash 2018   Family History  Problem Relation Age of Onset   Arthritis Mother        osteo   Arthritis Father        osteo   Heart disease Father        (pneumonia cause of death 40 yrs)   Prostate cancer Father    Diabetes Sister    Ovarian cancer Sister    Other Brother        celiac sprue   Prostate cancer Brother 66   Kidney failure Cousin        pat cousin   Kidney failure Cousin        pat cousin   Colon cancer Neg Hx    Esophageal cancer Neg Hx    Rectal cancer Neg Hx    Colon polyps Neg Hx    Stomach cancer Neg Hx      Current Outpatient Medications (Cardiovascular):    EPINEPHrine 0.3 mg/0.3 mL IJ SOAJ injection, SMARTSIG:Injection As Directed   rosuvastatin (CRESTOR) 20 MG tablet, TAKE ONE TABLET BY MOUTH DAILY   tadalafil (CIALIS) 5 MG tablet, TAKE ONE TABLET BY MOUTH ONE TIME DAILY  Current Outpatient Medications (Respiratory):    fluticasone (FLONASE) 50 MCG/ACT nasal spray, Place 1 spray into both nostrils 2 (two) times daily.    loratadine (CLARITIN) 10 MG tablet, Take 10 mg by mouth daily.  Current Outpatient Medications (Analgesics):    allopurinol  (ZYLOPRIM) 300 MG tablet, TAKE 1 TABLET BY MOUTH DAILY AS DIRECTED   aspirin EC 81 MG tablet, Take 1 tablet (81 mg total) by mouth daily.   Current Outpatient Medications (Other):    pantoprazole (PROTONIX) 20 MG tablet, TAKE 1 TABLET BY MOUTH DAILY BEFORE BREAKFAST   PRESCRIPTION MEDICATION, Allergy shots every other week   Vitamin D, Ergocalciferol, (  DRISDOL) 1.25 MG (50000 UNIT) CAPS capsule, Take 1 capsule (50,000 Units total) by mouth every 7 (seven) days.   Reviewed prior external information including notes and imaging from  primary care provider As well as notes that were available from care everywhere and other healthcare systems.  Past medical history, social, surgical and family history all reviewed in electronic medical record.  Bennett pertanent information unless stated regarding to the chief complaint.   Review of Systems:  Bennett headache, visual changes, nausea, vomiting, diarrhea, constipation, dizziness, abdominal pain, skin rash, fevers, chills, night sweats, weight loss, swollen lymph nodes, body aches, joint swelling, chest pain, shortness of breath, mood changes. POSITIVE muscle aches  Objective  Blood pressure 128/86, pulse 61, height 5' 9"$  (1.753 m), weight 168 lb (76.2 kg), SpO2 99 %.   General: Bennett apparent distress alert and oriented x3 mood and affect normal, dressed appropriately.  HEENT: Pupils equal, extraocular movements intact  Respiratory: Patient's speak in full sentences and does not appear short of breath  Cardiovascular: Bennett lower extremity edema, non tender, Bennett erythema  Patient does have more tenderness to palpation over the right lateral epicondylar area.  Good grip strength noted.  Pain with resisted extension.  Neck exam does have some limited range of motion noted.  Good grip strength noted.  Negative tenderness over the carpal tunnel.  Procedure: Real-time Ultrasound Guided Injection of right common extensor tendon sheath Device: GE Logiq Q7 Ultrasound  guided injection is preferred based studies that show increased duration, increased effect, greater accuracy, decreased procedural pain, increased response rate, and decreased cost with ultrasound guided versus blind injection.  Verbal informed consent obtained.  Time-out conducted.  Noted Bennett overlying erythema, induration, or other signs of local infection.  Skin prepped in a sterile fashion.  Local anesthesia: Topical Ethyl chloride.  With sterile technique and under real time ultrasound guidance: With a 25-gauge half inch needle injected with 0.5 cc of 0.5% Marcaine and 0.5 cc of Kenalog 40 mg/mL Completed without difficulty  Pain immediately resolved suggesting accurate placement of the medication.  Advised to call if fevers/chills, erythema, induration, drainage, or persistent bleeding.  Impression: Technically successful ultrasound guided injection.   Impression and Recommendations:    The above documentation has been reviewed and is accurate and complete Lyndal Pulley, DO  The above documentation has been reviewed and is accurate and complete Lyndal Pulley, DO

## 2022-10-05 ENCOUNTER — Ambulatory Visit: Payer: PPO | Admitting: Family Medicine

## 2022-10-05 ENCOUNTER — Ambulatory Visit: Payer: Self-pay

## 2022-10-05 ENCOUNTER — Ambulatory Visit (INDEPENDENT_AMBULATORY_CARE_PROVIDER_SITE_OTHER): Payer: PPO

## 2022-10-05 VITALS — BP 128/86 | HR 61 | Ht 69.0 in | Wt 168.0 lb

## 2022-10-05 DIAGNOSIS — M7711 Lateral epicondylitis, right elbow: Secondary | ICD-10-CM

## 2022-10-05 DIAGNOSIS — G5603 Carpal tunnel syndrome, bilateral upper limbs: Secondary | ICD-10-CM

## 2022-10-05 DIAGNOSIS — M5030 Other cervical disc degeneration, unspecified cervical region: Secondary | ICD-10-CM

## 2022-10-05 DIAGNOSIS — M542 Cervicalgia: Secondary | ICD-10-CM

## 2022-10-05 NOTE — Assessment & Plan Note (Signed)
Degenerative disc disease.  Discussed which activities to do and which ones to avoid.  I am concerned that there is a possibility of some radicular symptoms that is contributing.  Discussed icing regimen and home exercises.  Follow-up with me again in 6 to 8 weeks

## 2022-10-05 NOTE — Patient Instructions (Addendum)
Injected elbow today If not better in 10-14 days we will get MRI  You can write me to give me an update Xray on your way out See me again in 2 months

## 2022-10-05 NOTE — Assessment & Plan Note (Signed)
Still concerned with the potential for patient having pain that is more radicular.  I am doing this for diagnostic as well as hopefully therapeutic properties.  Discussed which activities to do and which ones to avoid.  Follow-up again in 6 to 8 weeks discussed otherwise we will workup patient's neck.

## 2022-10-09 ENCOUNTER — Other Ambulatory Visit (HOSPITAL_BASED_OUTPATIENT_CLINIC_OR_DEPARTMENT_OTHER): Payer: Self-pay | Admitting: Cardiology

## 2022-10-09 DIAGNOSIS — E7800 Pure hypercholesterolemia, unspecified: Secondary | ICD-10-CM

## 2022-10-10 ENCOUNTER — Other Ambulatory Visit: Payer: Self-pay

## 2022-10-10 ENCOUNTER — Encounter: Payer: Self-pay | Admitting: Family Medicine

## 2022-10-10 DIAGNOSIS — J3081 Allergic rhinitis due to animal (cat) (dog) hair and dander: Secondary | ICD-10-CM | POA: Diagnosis not present

## 2022-10-10 DIAGNOSIS — J3089 Other allergic rhinitis: Secondary | ICD-10-CM | POA: Diagnosis not present

## 2022-10-10 DIAGNOSIS — J301 Allergic rhinitis due to pollen: Secondary | ICD-10-CM | POA: Diagnosis not present

## 2022-10-10 DIAGNOSIS — M25521 Pain in right elbow: Secondary | ICD-10-CM

## 2022-10-10 NOTE — Telephone Encounter (Signed)
Rx(s) sent to pharmacy electronically.  

## 2022-10-18 ENCOUNTER — Encounter: Payer: Self-pay | Admitting: Internal Medicine

## 2022-10-18 ENCOUNTER — Ambulatory Visit (INDEPENDENT_AMBULATORY_CARE_PROVIDER_SITE_OTHER): Payer: PPO | Admitting: Internal Medicine

## 2022-10-18 VITALS — BP 102/68 | HR 62 | Temp 98.2°F | Wt 165.4 lb

## 2022-10-18 DIAGNOSIS — K219 Gastro-esophageal reflux disease without esophagitis: Secondary | ICD-10-CM | POA: Diagnosis not present

## 2022-10-18 DIAGNOSIS — E7800 Pure hypercholesterolemia, unspecified: Secondary | ICD-10-CM | POA: Diagnosis not present

## 2022-10-18 DIAGNOSIS — N529 Male erectile dysfunction, unspecified: Secondary | ICD-10-CM | POA: Diagnosis not present

## 2022-10-18 DIAGNOSIS — Z889 Allergy status to unspecified drugs, medicaments and biological substances status: Secondary | ICD-10-CM | POA: Diagnosis not present

## 2022-10-18 NOTE — Progress Notes (Signed)
Established Patient Office Visit     CC/Reason for Visit: 96-monthfollow-up chronic conditions  HPI: Stanley CORROis a 67y.o. male who is coming in today for the above mentioned reasons. Past Medical History is significant for: Hyperlipidemia, iron deficiency anemia, aortic atherosclerosis on a statin, erectile dysfunction.  He has not been having palpitations since the fall.  He is now off metoprolol.  He feels well and has no acute concerns other than a cough and postnasal drip that has started up in the last couple weeks.   Past Medical/Surgical History: Past Medical History:  Diagnosis Date   Allergy    seasonal allergies   Arthritis    generalized   Basal cell carcinoma    scalp   GERD (gastroesophageal reflux disease)    on meds   Gout    on gout   Hyperlipidemia    on meds    Past Surgical History:  Procedure Laterality Date   APPENDECTOMY     BASAL CELL CARCINOMA EXCISION     CERVICAL DISC SURGERY     COLONOSCOPY  05/2017   MAC-suprep(exc)-polyps   TONSILLECTOMY     UPPER GASTROINTESTINAL ENDOSCOPY  05/2017   normal   VASECTOMY     WISDOM TOOTH EXTRACTION      Social History:  reports that he has never smoked. He has never used smokeless tobacco. He reports current alcohol use of about 5.0 standard drinks of alcohol per week. He reports that he does not use drugs.  Allergies: Allergies  Allergen Reactions   Teriflunomide Other (See Comments)   Tamiflu [Oseltamivir] Rash    Mild rash 2018    Family History:  Family History  Problem Relation Age of Onset   Arthritis Mother        osteo   Arthritis Father        osteo   Heart disease Father        (pneumonia cause of death 919yrs)   Prostate cancer Father    Diabetes Sister    Ovarian cancer Sister    Other Brother        celiac sprue   Prostate cancer Brother 653  Kidney failure Cousin        pat cousin   Kidney failure Cousin        pat cousin   Colon cancer Neg Hx     Esophageal cancer Neg Hx    Rectal cancer Neg Hx    Colon polyps Neg Hx    Stomach cancer Neg Hx      Current Outpatient Medications:    allopurinol (ZYLOPRIM) 300 MG tablet, TAKE 1 TABLET BY MOUTH DAILY AS DIRECTED, Disp: 90 tablet, Rfl: 1   aspirin EC 81 MG tablet, Take 1 tablet (81 mg total) by mouth daily., Disp: 90 tablet, Rfl: 3   EPINEPHrine 0.3 mg/0.3 mL IJ SOAJ injection, SMARTSIG:Injection As Directed, Disp: , Rfl:    fluticasone (FLONASE) 50 MCG/ACT nasal spray, Place 1 spray into both nostrils 2 (two) times daily. , Disp: , Rfl:    loratadine (CLARITIN) 10 MG tablet, Take 10 mg by mouth daily., Disp: , Rfl:    pantoprazole (PROTONIX) 20 MG tablet, TAKE 1 TABLET BY MOUTH DAILY BEFORE BREAKFAST, Disp: 90 tablet, Rfl: 1   PRESCRIPTION MEDICATION, Allergy shots every other week, Disp: , Rfl:    rosuvastatin (CRESTOR) 20 MG tablet, TAKE 1 TABLET BY MOUTH DAILY, Disp: 90 tablet, Rfl: 1  tadalafil (CIALIS) 5 MG tablet, TAKE ONE TABLET BY MOUTH ONE TIME DAILY, Disp: 90 tablet, Rfl: 0   Vitamin D, Ergocalciferol, (DRISDOL) 1.25 MG (50000 UNIT) CAPS capsule, Take 1 capsule (50,000 Units total) by mouth every 7 (seven) days., Disp: 12 capsule, Rfl: 0  Review of Systems:  Negative unless indicated in HPI.   Physical Exam: Vitals:   10/18/22 1053  BP: 102/68  Pulse: 62  Temp: 98.2 F (36.8 C)  TempSrc: Oral  SpO2: 98%  Weight: 165 lb 6.4 oz (75 kg)    Body mass index is 24.43 kg/m.   Physical Exam Vitals reviewed.  Constitutional:      Appearance: Normal appearance.  HENT:     Head: Normocephalic and atraumatic.  Eyes:     Conjunctiva/sclera: Conjunctivae normal.     Pupils: Pupils are equal, round, and reactive to light.  Cardiovascular:     Rate and Rhythm: Normal rate and regular rhythm.  Pulmonary:     Effort: Pulmonary effort is normal.     Breath sounds: Normal breath sounds.  Skin:    General: Skin is warm and dry.  Neurological:     General: No focal  deficit present.     Mental Status: He is alert and oriented to person, place, and time.  Psychiatric:        Mood and Affect: Mood normal.        Behavior: Behavior normal.        Thought Content: Thought content normal.        Judgment: Judgment normal.      Impression and Plan:  H/O seasonal allergies  Erectile dysfunction, unspecified erectile dysfunction type  Gastroesophageal reflux disease, unspecified whether esophagitis present  Pure hypercholesterolemia  -I think cough and postnasal drainage are likely related to seasonal allergies.  Daily antihistamine advised. -He remains adherent to medical therapy, no new changes today. -He continues to take daily iron supplementation.  Time spent:31 minutes reviewing chart, interviewing and examining patient and formulating plan of care.     Lelon Frohlich, MD [facility]

## 2022-10-20 DIAGNOSIS — J301 Allergic rhinitis due to pollen: Secondary | ICD-10-CM | POA: Diagnosis not present

## 2022-10-20 DIAGNOSIS — J3089 Other allergic rhinitis: Secondary | ICD-10-CM | POA: Diagnosis not present

## 2022-10-20 DIAGNOSIS — J3081 Allergic rhinitis due to animal (cat) (dog) hair and dander: Secondary | ICD-10-CM | POA: Diagnosis not present

## 2022-10-28 ENCOUNTER — Ambulatory Visit
Admission: RE | Admit: 2022-10-28 | Discharge: 2022-10-28 | Disposition: A | Discharge status: Home / Self Care | Payer: PPO | Source: Ambulatory Visit | Attending: Family Medicine | Admitting: Family Medicine

## 2022-10-28 DIAGNOSIS — M19021 Primary osteoarthritis, right elbow: Secondary | ICD-10-CM | POA: Diagnosis not present

## 2022-10-28 DIAGNOSIS — J3081 Allergic rhinitis due to animal (cat) (dog) hair and dander: Secondary | ICD-10-CM | POA: Diagnosis not present

## 2022-10-28 DIAGNOSIS — M25421 Effusion, right elbow: Secondary | ICD-10-CM | POA: Diagnosis not present

## 2022-10-28 DIAGNOSIS — J3089 Other allergic rhinitis: Secondary | ICD-10-CM | POA: Diagnosis not present

## 2022-10-28 DIAGNOSIS — M25521 Pain in right elbow: Secondary | ICD-10-CM

## 2022-10-28 DIAGNOSIS — J301 Allergic rhinitis due to pollen: Secondary | ICD-10-CM | POA: Diagnosis not present

## 2022-10-31 IMAGING — DX DG CHEST 2V
2 series · 2 of 2 positions shown · non-contrast
Comparison: July 25, 2021

CLINICAL DATA: Fatigue

EXAM:
CHEST - 2 VIEW

[chest pa]
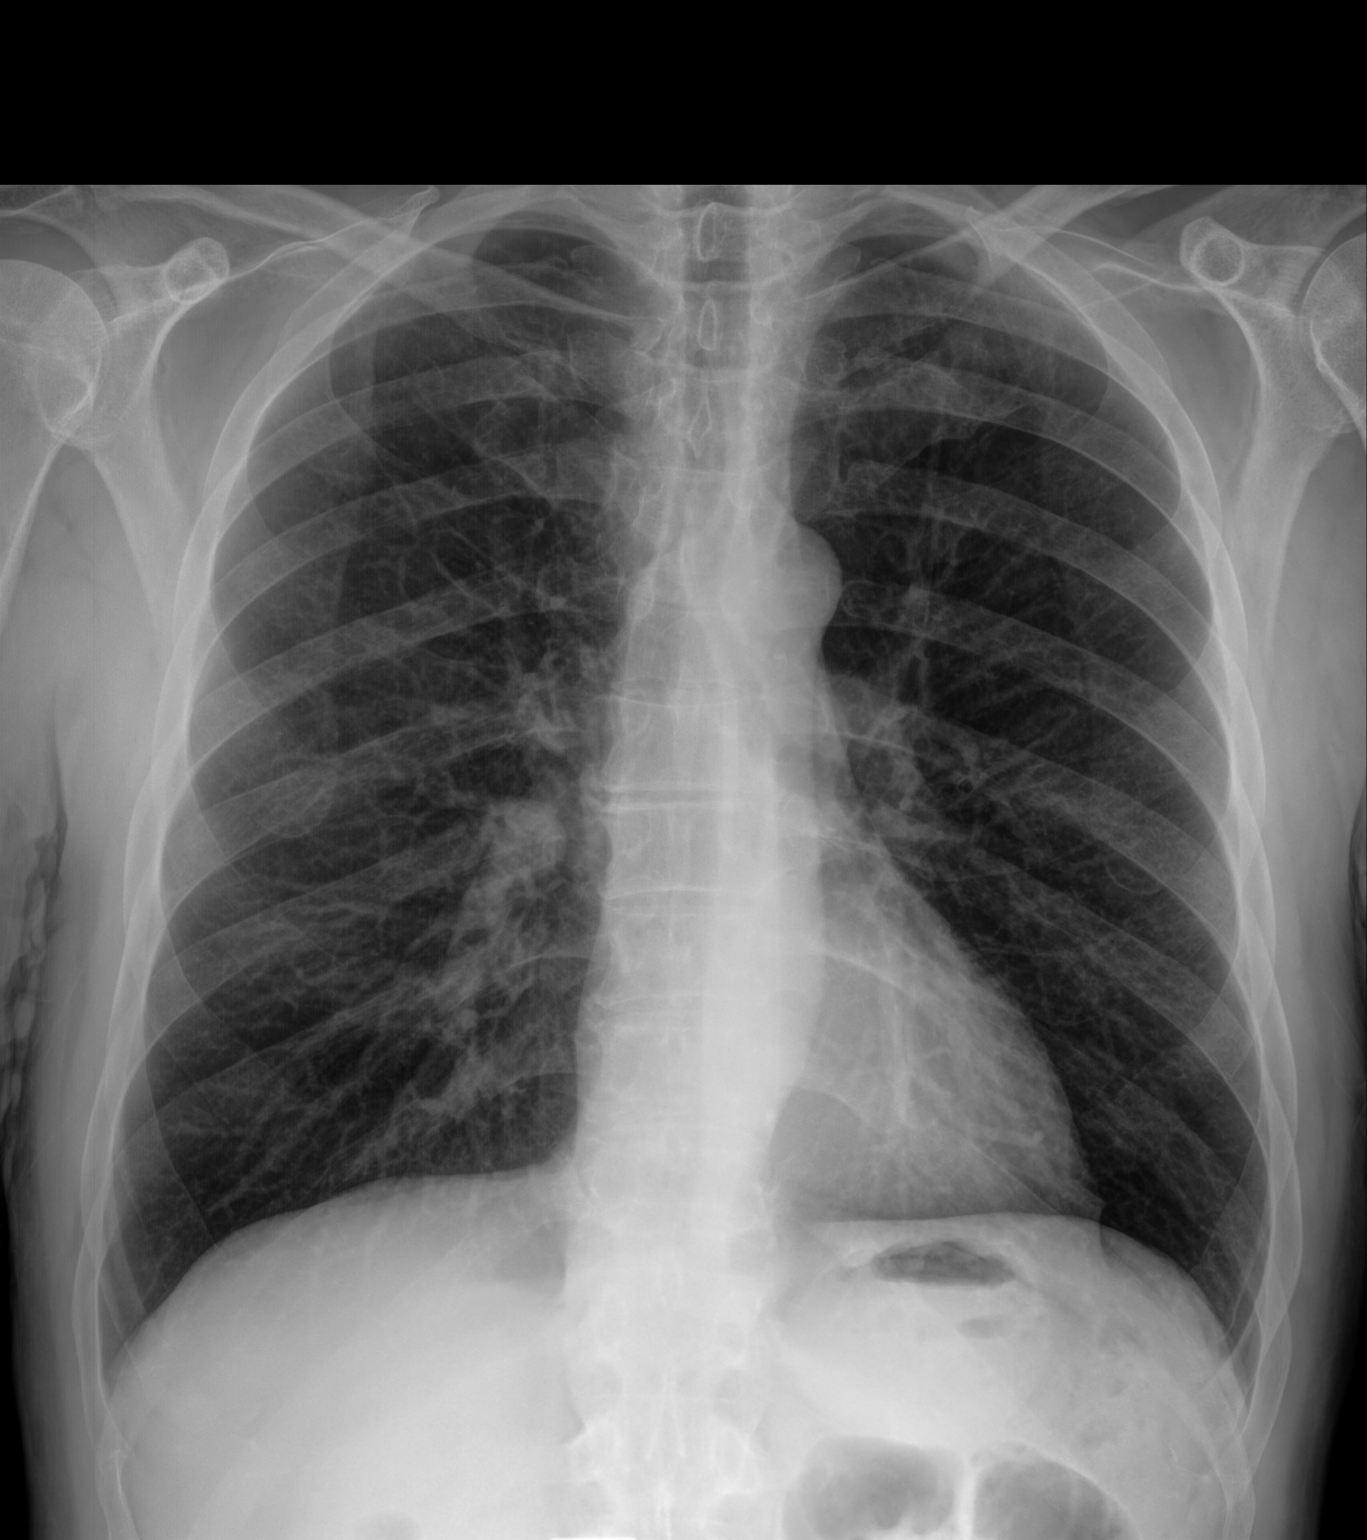

[chest lat]
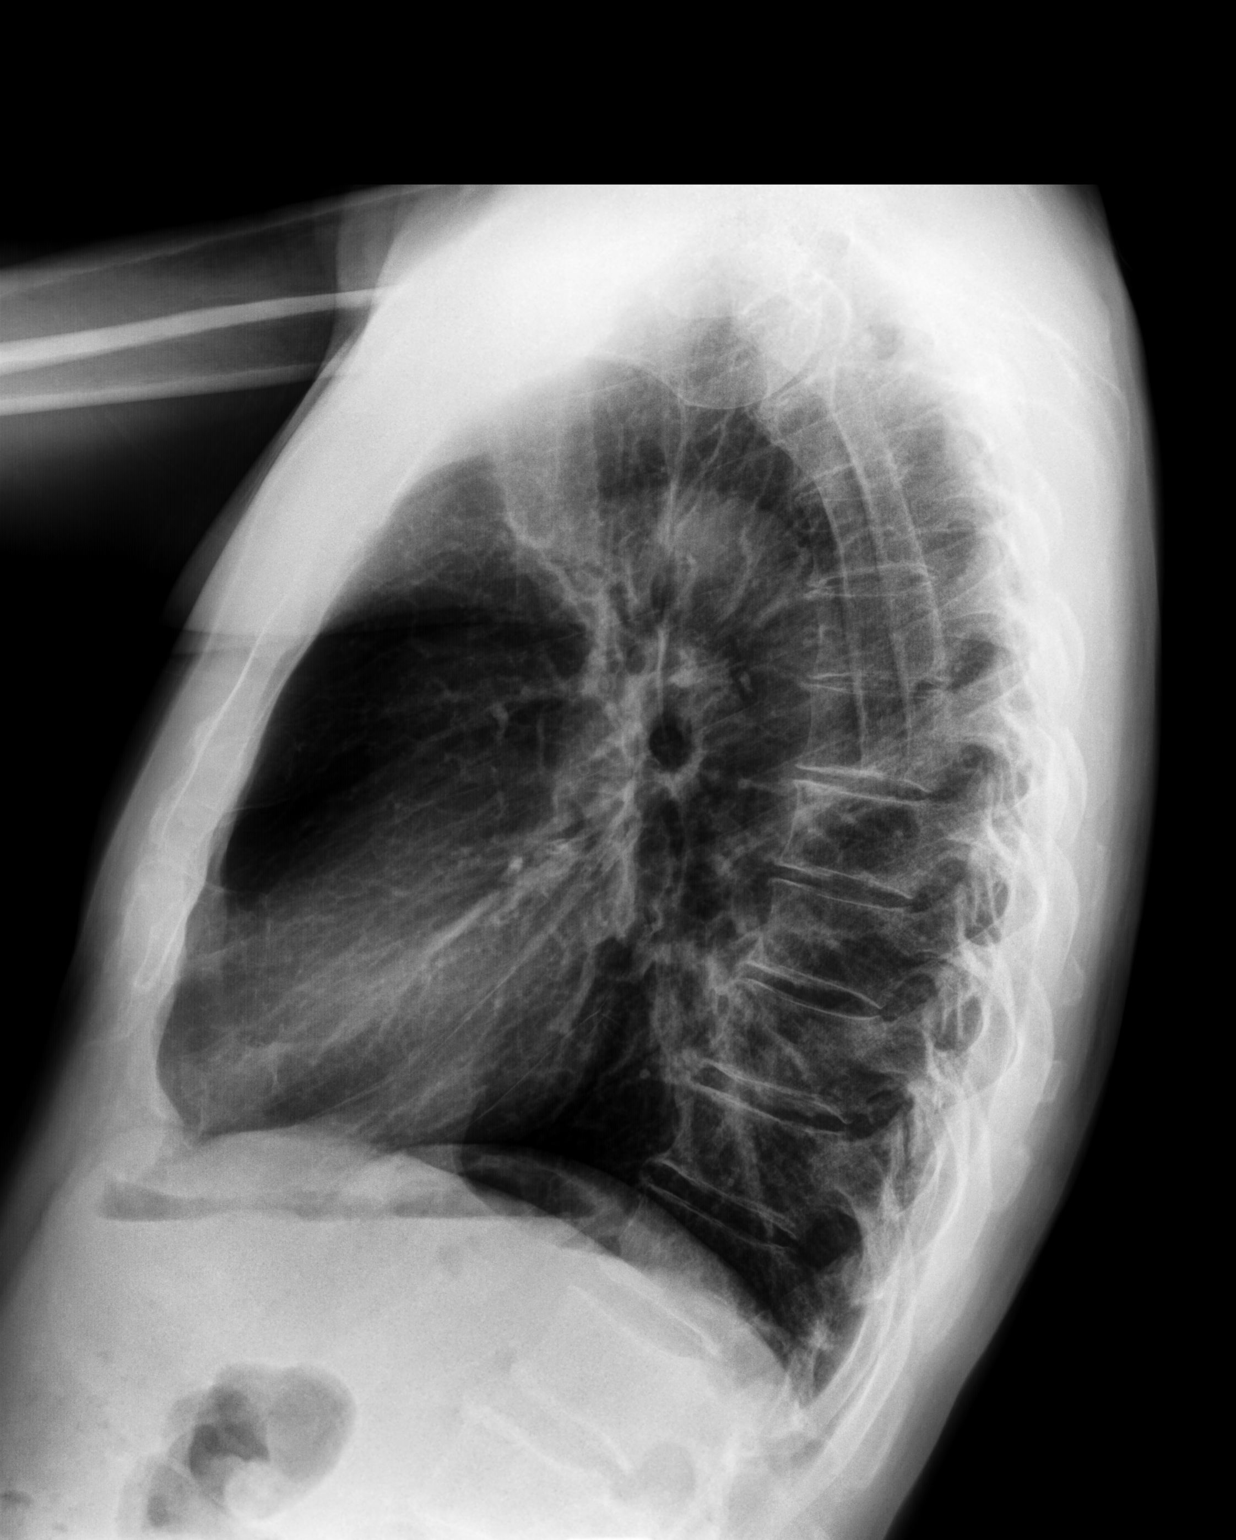

[2 of 2 positions shown; findings below may reference images not displayed]

FINDINGS: The heart size and mediastinal contours are within normal limits.
Both lungs are clear. The visualized skeletal structures are
unremarkable.
IMPRESSION: No active cardiopulmonary disease.

## 2022-11-01 DIAGNOSIS — J301 Allergic rhinitis due to pollen: Secondary | ICD-10-CM | POA: Diagnosis not present

## 2022-11-01 DIAGNOSIS — J3089 Other allergic rhinitis: Secondary | ICD-10-CM | POA: Diagnosis not present

## 2022-11-01 DIAGNOSIS — J3081 Allergic rhinitis due to animal (cat) (dog) hair and dander: Secondary | ICD-10-CM | POA: Diagnosis not present

## 2022-11-10 DIAGNOSIS — J301 Allergic rhinitis due to pollen: Secondary | ICD-10-CM | POA: Diagnosis not present

## 2022-11-10 DIAGNOSIS — J3081 Allergic rhinitis due to animal (cat) (dog) hair and dander: Secondary | ICD-10-CM | POA: Diagnosis not present

## 2022-11-10 DIAGNOSIS — J3089 Other allergic rhinitis: Secondary | ICD-10-CM | POA: Diagnosis not present

## 2022-11-25 DIAGNOSIS — J301 Allergic rhinitis due to pollen: Secondary | ICD-10-CM | POA: Diagnosis not present

## 2022-11-25 DIAGNOSIS — J3081 Allergic rhinitis due to animal (cat) (dog) hair and dander: Secondary | ICD-10-CM | POA: Diagnosis not present

## 2022-11-25 DIAGNOSIS — J3089 Other allergic rhinitis: Secondary | ICD-10-CM | POA: Diagnosis not present

## 2022-12-02 DIAGNOSIS — J3081 Allergic rhinitis due to animal (cat) (dog) hair and dander: Secondary | ICD-10-CM | POA: Diagnosis not present

## 2022-12-02 DIAGNOSIS — J301 Allergic rhinitis due to pollen: Secondary | ICD-10-CM | POA: Diagnosis not present

## 2022-12-02 DIAGNOSIS — J3089 Other allergic rhinitis: Secondary | ICD-10-CM | POA: Diagnosis not present

## 2022-12-12 DIAGNOSIS — J3081 Allergic rhinitis due to animal (cat) (dog) hair and dander: Secondary | ICD-10-CM | POA: Diagnosis not present

## 2022-12-12 DIAGNOSIS — J3089 Other allergic rhinitis: Secondary | ICD-10-CM | POA: Diagnosis not present

## 2022-12-12 DIAGNOSIS — J301 Allergic rhinitis due to pollen: Secondary | ICD-10-CM | POA: Diagnosis not present

## 2022-12-17 ENCOUNTER — Other Ambulatory Visit: Payer: Self-pay | Admitting: Internal Medicine

## 2022-12-17 DIAGNOSIS — N401 Enlarged prostate with lower urinary tract symptoms: Secondary | ICD-10-CM

## 2022-12-21 DIAGNOSIS — J301 Allergic rhinitis due to pollen: Secondary | ICD-10-CM | POA: Diagnosis not present

## 2022-12-21 DIAGNOSIS — J3081 Allergic rhinitis due to animal (cat) (dog) hair and dander: Secondary | ICD-10-CM | POA: Diagnosis not present

## 2022-12-21 DIAGNOSIS — J3089 Other allergic rhinitis: Secondary | ICD-10-CM | POA: Diagnosis not present

## 2022-12-28 DIAGNOSIS — J3081 Allergic rhinitis due to animal (cat) (dog) hair and dander: Secondary | ICD-10-CM | POA: Diagnosis not present

## 2022-12-28 DIAGNOSIS — J3089 Other allergic rhinitis: Secondary | ICD-10-CM | POA: Diagnosis not present

## 2022-12-28 DIAGNOSIS — J301 Allergic rhinitis due to pollen: Secondary | ICD-10-CM | POA: Diagnosis not present

## 2023-01-03 NOTE — Progress Notes (Unsigned)
[name] [address] [place] [address] Phone: [phone] Subjective:   Stanley Bennett, am serving as a scribe for Dr. Antoine Primas.  I'm seeing this patient by the request  of:  Philip Aspen, Limmie Patricia, MD  CC: Right and left hip pain  [id number]  10/05/2022 Still concerned with the potential for patient having pain that is more radicular. I am doing this for diagnostic as well as hopefully therapeutic properties. Discussed which activities to do and which ones to avoid. Follow-up again in 6 to 8 weeks discussed otherwise we will workup patient's neck.   Degenerative disc disease. Discussed which activities to do and which ones to avoid. I am concerned that there is a possibility of some radicular symptoms that is contributing. Discussed icing regimen and home exercises. Follow-up with me again in 6 to 8 weeks   Updated 01/04/2023 Stanley Bennett is a 67 y.o. male coming in with complaint of back and R elbow pain. Patient states that his pain was better after injection for 2 weeks. Numbness in 4th and 5th is constant. Pain is increasing over lateral epicondyle.  Hip and back pain is constant. Pain in L hip flared last week and he was unable to go for his walk. Pain in R hip radiates from anterior aspect to the knee and on the L hip radiates into the groin.        Past Medical History:  Diagnosis Date   Allergy    seasonal allergies   Arthritis    generalized   Basal cell carcinoma    scalp   GERD (gastroesophageal reflux disease)    on meds   Gout    on gout   Hyperlipidemia    on meds   Past Surgical History:  Procedure Laterality Date   APPENDECTOMY     BASAL CELL CARCINOMA EXCISION     CERVICAL DISC SURGERY     COLONOSCOPY  05/2017   MAC-suprep(exc)-polyps   TONSILLECTOMY     UPPER GASTROINTESTINAL ENDOSCOPY  05/2017   normal   VASECTOMY     WISDOM TOOTH EXTRACTION     Social History   Socioeconomic  History   Marital status: Married    Spouse name: Larita Fife   Number of children: 3   Years of education: Not on file   Highest education level: Bachelor's degree (e.g., BA, AB, BS)  Occupational History   Occupation: Brewing technologist    Comment: retired  Tobacco Use   Smoking status: Never   Smokeless tobacco: Never  Vaping Use   Vaping Use: Never used  Substance and Sexual Activity   Alcohol use: Yes    Alcohol/week: 5.0 standard drinks of alcohol    Types: 5 Standard drinks or equivalent per week    Comment: 5   Drug use: No   Sexual activity: Yes    Partners: Female  Other Topics Concern   Not on file  Social History Narrative   Work or School: Advertising account planner - Administrator, 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      Home Situation: lives with wife      Spiritual Beliefs: Catholic      Lifestyle: regular exercise and healthy diet   Social Determinants of Health   Financial Resource Strain: Low Risk  (07/04/2022)   Overall Financial Resource Strain (CARDIA)    Difficulty of Paying Living Expenses: Not hard at all  Food Insecurity: No Food Insecurity (07/04/2022)   Hunger Vital Sign  Worried About Programme researcher, broadcasting/film/video in the Last Year: Never true    Ran Out of Food in the Last Year: Never true  Transportation Needs: No Transportation Needs (07/04/2022)   PRAPARE - Administrator, Civil Service (Medical): No    Lack of Transportation (Non-Medical): No  Physical Activity: Sufficiently Active (07/04/2022)   Exercise Vital Sign    Days of Exercise per Week: 6 days    Minutes of Exercise per Session: 60 min  Stress: No Stress Concern Present (07/04/2022)   [name]    Feeling of Stress : Not at all  Social Connections: Socially Integrated (07/04/2022)   Social Connection and Isolation Panel [NHANES]    Frequency of Communication with Friends and Family: More than three times a week    Frequency of  Social Gatherings with Friends and Family: More than three times a week    Attends Religious Services: More than 4 times per year    Active Member of [employer] or Organizations: Yes    Attends Engineer, structural: More than 4 times per year    Marital Status: Married   Allergies  Allergen Reactions   Teriflunomide Other (See Comments)   Tamiflu [Oseltamivir] Rash    Mild rash 2018   Family History  Problem Relation Age of Onset   Arthritis Mother        osteo   Arthritis Father        osteo   Heart disease Father        (pneumonia cause of death 51 yrs)   Prostate cancer Father    Diabetes Sister    Ovarian cancer Sister    Other Brother        celiac sprue   Prostate cancer Brother 73   Kidney failure Cousin        pat cousin   Kidney failure Cousin        pat cousin   Colon cancer Neg Hx    Esophageal cancer Neg Hx    Rectal cancer Neg Hx    Colon polyps Neg Hx    Stomach cancer Neg Hx      Current Outpatient Medications (Cardiovascular):    EPINEPHrine 0.3 mg/0.3 mL IJ SOAJ injection, SMARTSIG:Injection As Directed   rosuvastatin (CRESTOR) 20 MG tablet, TAKE 1 TABLET BY MOUTH DAILY   tadalafil (CIALIS) 5 MG tablet, TAKE ONE TABLET BY MOUTH ONE TIME DAILY  Current Outpatient Medications (Respiratory):    fluticasone (FLONASE) 50 MCG/ACT nasal spray, Place 1 spray into both nostrils 2 (two) times daily.    loratadine (CLARITIN) 10 MG tablet, Take 10 mg by mouth daily.  Current Outpatient Medications (Analgesics):    allopurinol (ZYLOPRIM) 300 MG tablet, TAKE 1 TABLET BY MOUTH DAILY AS DIRECTED   aspirin EC 81 MG tablet, Take 1 tablet (81 mg total) by mouth daily.   Current Outpatient Medications (Other):    pantoprazole (PROTONIX) 20 MG tablet, TAKE 1 TABLET BY MOUTH DAILY BEFORE BREAKFAST   PRESCRIPTION MEDICATION, Allergy shots every other week   Vitamin D, Ergocalciferol, (DRISDOL) 1.25 MG (50000 UNIT) CAPS capsule, Take 1 capsule (50,000 Units total)  by mouth every 7 (seven) days.   Reviewed prior external information including notes and imaging from  primary care provider As well as notes that were available from care everywhere and other healthcare systems.  Past medical history, social, surgical and family history all reviewed in electronic medical record.  No pertanent information unless stated regarding to the chief complaint.   Review of Systems:  No headache, visual changes, nausea, vomiting, diarrhea, constipation, dizziness, abdominal pain, skin rash, fevers, chills, night sweats, weight loss, swollen lymph nodes,joint swelling, chest pain, shortness of breath, mood changes. POSITIVE muscle aches, body aches, fatigue  Objective  Blood pressure 112/78, pulse 60, height 5\' 9"  (1.753 m), weight 160 lb (72.6 kg), SpO2 98 %.   General: No apparent distress alert and oriented x3 mood and affect normal, dressed appropriately.  HEENT: Pupils equal, extraocular movements intact  Respiratory: Patient's speak in full sentences and does not appear short of breath  Cardiovascular: No lower extremity edema, non tender, no erythema  Hips bilaterally do have some decrease in range of motion noted.  Some tenderness to palpation over the piriformis left greater than right.  Pain is out of proportion.  Right hip does have weakness with 4 out of 5 strength hip flexion as well as hip abduction.    Impression and Recommendations:     The above documentation has been reviewed and is accurate and complete Judi Saa, DO

## 2023-01-04 ENCOUNTER — Ambulatory Visit (INDEPENDENT_AMBULATORY_CARE_PROVIDER_SITE_OTHER): Payer: PPO | Admitting: Family Medicine

## 2023-01-04 ENCOUNTER — Other Ambulatory Visit: Payer: Self-pay

## 2023-01-04 VITALS — BP 112/78 | HR 60 | Ht 69.0 in | Wt 160.0 lb

## 2023-01-04 DIAGNOSIS — G5702 Lesion of sciatic nerve, left lower limb: Secondary | ICD-10-CM | POA: Diagnosis not present

## 2023-01-04 DIAGNOSIS — M25851 Other specified joint disorders, right hip: Secondary | ICD-10-CM | POA: Diagnosis not present

## 2023-01-04 DIAGNOSIS — M25521 Pain in right elbow: Secondary | ICD-10-CM

## 2023-01-04 DIAGNOSIS — R102 Pelvic and perineal pain: Secondary | ICD-10-CM

## 2023-01-04 NOTE — Patient Instructions (Addendum)
MRI pelvis w and wo [phone] See me for PRP after daughter's wedding in June

## 2023-01-04 NOTE — Assessment & Plan Note (Signed)
Continues to have right hip and has been and unfortunately piriformis on the left side that does give him radicular symptoms.  Sometimes to stop him from activity.  Patient's MRI of the lumbar spine was unremarkable but continues to have some difficulties as well.  Discussed which activities to do and which ones to avoid.  Increase activity slowly over the course of next several weeks.  Because patient has failed such things as formal physical therapy, gabapentin, multiple anti-inflammatories I do feel that advanced imaging is warranted.

## 2023-01-05 ENCOUNTER — Encounter: Payer: Self-pay | Admitting: Family Medicine

## 2023-01-05 NOTE — Assessment & Plan Note (Signed)
Patient is having with chronic pain with worsening symptoms at this time.  Continues to have significant difficulty with this pain, also having still be radicular symptoms from time to time down the leg.  Patient has had some difficulty recently with urinary changes and do feel that an MRI of the pelvis with and without contrast to further evaluate this as well as patient's hip impingement.  Depending on findings this could mean we can see if we can change medical management patient would be a surgical candidate.  Patient has failed anti-inflammatories, prednisone, epidural injections on the back, as well as formal physical therapy.  This has been greater than 18 months duration of care.

## 2023-01-06 DIAGNOSIS — J3089 Other allergic rhinitis: Secondary | ICD-10-CM | POA: Diagnosis not present

## 2023-01-06 DIAGNOSIS — J3081 Allergic rhinitis due to animal (cat) (dog) hair and dander: Secondary | ICD-10-CM | POA: Diagnosis not present

## 2023-01-06 DIAGNOSIS — J301 Allergic rhinitis due to pollen: Secondary | ICD-10-CM | POA: Diagnosis not present

## 2023-01-10 ENCOUNTER — Ambulatory Visit
Admission: RE | Admit: 2023-01-10 | Discharge: 2023-01-10 | Disposition: A | Discharge status: Home / Self Care | Payer: PPO | Source: Ambulatory Visit | Attending: Family Medicine | Admitting: Family Medicine

## 2023-01-10 DIAGNOSIS — R102 Pelvic and perineal pain: Secondary | ICD-10-CM | POA: Diagnosis not present

## 2023-01-10 DIAGNOSIS — D7589 Other specified diseases of blood and blood-forming organs: Secondary | ICD-10-CM | POA: Diagnosis not present

## 2023-01-10 DIAGNOSIS — M7602 Gluteal tendinitis, left hip: Secondary | ICD-10-CM | POA: Diagnosis not present

## 2023-01-10 MED ORDER — GADOPICLENOL 0.5 MMOL/ML IV SOLN
7.0000 mL | Freq: Once | INTRAVENOUS | Status: AC | PRN
  Administered 2023-01-10: 7 mL via INTRAVENOUS

## 2023-01-11 DIAGNOSIS — L2089 Other atopic dermatitis: Secondary | ICD-10-CM | POA: Diagnosis not present

## 2023-01-11 DIAGNOSIS — J3089 Other allergic rhinitis: Secondary | ICD-10-CM | POA: Diagnosis not present

## 2023-01-11 DIAGNOSIS — J301 Allergic rhinitis due to pollen: Secondary | ICD-10-CM | POA: Diagnosis not present

## 2023-01-11 DIAGNOSIS — J3081 Allergic rhinitis due to animal (cat) (dog) hair and dander: Secondary | ICD-10-CM | POA: Diagnosis not present

## 2023-01-12 ENCOUNTER — Ambulatory Visit (HOSPITAL_BASED_OUTPATIENT_CLINIC_OR_DEPARTMENT_OTHER): Payer: PPO | Admitting: Cardiology

## 2023-01-16 ENCOUNTER — Encounter: Payer: Self-pay | Admitting: Family Medicine

## 2023-01-18 DIAGNOSIS — J3081 Allergic rhinitis due to animal (cat) (dog) hair and dander: Secondary | ICD-10-CM | POA: Diagnosis not present

## 2023-01-18 DIAGNOSIS — J3089 Other allergic rhinitis: Secondary | ICD-10-CM | POA: Diagnosis not present

## 2023-01-18 DIAGNOSIS — J301 Allergic rhinitis due to pollen: Secondary | ICD-10-CM | POA: Diagnosis not present

## 2023-01-19 ENCOUNTER — Encounter: Payer: Self-pay | Admitting: Family Medicine

## 2023-01-19 NOTE — Telephone Encounter (Signed)
Patient called in response to this as well. MRI has not been read, we will contact [facility] to try to push along the read. - Informed patient of the radiologist shortage and the reason for delay in reading.

## 2023-01-23 DIAGNOSIS — J3089 Other allergic rhinitis: Secondary | ICD-10-CM | POA: Diagnosis not present

## 2023-01-23 DIAGNOSIS — J301 Allergic rhinitis due to pollen: Secondary | ICD-10-CM | POA: Diagnosis not present

## 2023-01-23 DIAGNOSIS — J3081 Allergic rhinitis due to animal (cat) (dog) hair and dander: Secondary | ICD-10-CM | POA: Diagnosis not present

## 2023-01-30 NOTE — Progress Notes (Signed)
[facility] [address] [place] [address] Phone: [phone] Subjective:    I'm seeing this patient by the request  of:  Philip Aspen, Limmie Patricia, MD  CC: Gluteal tearing and lateral epicondylitis  [id number]  01/04/2023 Patient is having with chronic pain with worsening symptoms at this time.  Continues to have significant difficulty with this pain, also having still be radicular symptoms from time to time down the leg.  Patient has had some difficulty recently with urinary changes and do feel that an MRI of the pelvis with and without contrast to further evaluate this as well as patient's hip impingement.  Depending on findings this could mean we can see if we can change medical management patient would be a surgical candidate.  Patient has failed anti-inflammatories, prednisone, epidural injections on the back, as well as formal physical therapy.  This has been greater than 18 months duration of care.   Continues to have right hip and has been and unfortunately piriformis on the left side that does give him radicular symptoms.  Sometimes to stop him from activity.  Patient's MRI of the lumbar spine was unremarkable but continues to have some difficulties as well.  Discussed which activities to do and which ones to avoid.  Increase activity slowly over the course of next several weeks.  Because patient has failed such things as formal physical therapy, gabapentin, multiple anti-inflammatories I do feel that advanced imaging is warranted.     Update 02/06/2023 Stanley Bennett is a 67 y.o. male coming in with complaint of L glute tendon tear. Patient states continues to have pain in the gluteal area as well as in the lateral epicondylar region       Past Medical History:  Diagnosis Date   Allergy    seasonal allergies   Arthritis    generalized   Basal cell carcinoma    scalp   GERD (gastroesophageal reflux disease)    on meds   Gout     on gout   Hyperlipidemia    on meds   Past Surgical History:  Procedure Laterality Date   APPENDECTOMY     BASAL CELL CARCINOMA EXCISION     CERVICAL DISC SURGERY     COLONOSCOPY  05/2017   MAC-suprep(exc)-polyps   TONSILLECTOMY     UPPER GASTROINTESTINAL ENDOSCOPY  05/2017   normal   VASECTOMY     WISDOM TOOTH EXTRACTION     Social History   Socioeconomic History   Marital status: Married    Spouse name: Larita Fife   Number of children: 3   Years of education: Not on file   Highest education level: Bachelor's degree (e.g., BA, AB, BS)  Occupational History   Occupation: Brewing technologist    Comment: retired  Tobacco Use   Smoking status: Never   Smokeless tobacco: Never  Vaping Use   Vaping Use: Never used  Substance and Sexual Activity   Alcohol use: Yes    Alcohol/week: 5.0 standard drinks of alcohol    Types: 5 Standard drinks or equivalent per week    Comment: 5   Drug use: No   Sexual activity: Yes    Partners: Female  Other Topics Concern   Not on file  Social History Narrative   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, 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      Home Situation: lives with wife      Spiritual Beliefs: Catholic      Lifestyle:  regular exercise and healthy diet   Social Determinants of Health   Financial Resource Strain: Low Risk  (07/04/2022)   Overall Financial Resource Strain (CARDIA)    Difficulty of Paying Living Expenses: Not hard at all  Food Insecurity: No Food Insecurity (07/04/2022)   Hunger Vital Sign    Worried About Running Out of Food in the Last Year: Never true    Ran Out of Food in the Last Year: Never true  Transportation Needs: No Transportation Needs (07/04/2022)   PRAPARE - Administrator, Civil Service (Medical): No    Lack of Transportation (Non-Medical): No  Physical Activity: Sufficiently Active (07/04/2022)   Exercise Vital Sign    Days of Exercise per Week: 6 days    Minutes of Exercise per Session:  60 min  Stress: No Stress Concern Present (07/04/2022)   [name]    Feeling of Stress : Not at all  Social Connections: Socially Integrated (07/04/2022)   Social Connection and Isolation Panel [NHANES]    Frequency of Communication with Friends and Family: More than three times a week    Frequency of Social Gatherings with Friends and Family: More than three times a week    Attends Religious Services: More than 4 times per year    Active Member of [employer] or Organizations: Yes    Attends Engineer, structural: More than 4 times per year    Marital Status: Married   Allergies  Allergen Reactions   Teriflunomide Other (See Comments)   Tamiflu [Oseltamivir] Rash    Mild rash 2018   Family History  Problem Relation Age of Onset   Arthritis Mother        osteo   Arthritis Father        osteo   Heart disease Father        (pneumonia cause of death 67 yrs)   Prostate cancer Father    Diabetes Sister    Ovarian cancer Sister    Other Brother        celiac sprue   Prostate cancer Brother 69   Kidney failure Cousin        pat cousin   Kidney failure Cousin        pat cousin   Colon cancer Neg Hx    Esophageal cancer Neg Hx    Rectal cancer Neg Hx    Colon polyps Neg Hx    Stomach cancer Neg Hx      Current Outpatient Medications (Cardiovascular):    EPINEPHrine 0.3 mg/0.3 mL IJ SOAJ injection, SMARTSIG:Injection As Directed   rosuvastatin (CRESTOR) 20 MG tablet, TAKE 1 TABLET BY MOUTH DAILY   tadalafil (CIALIS) 5 MG tablet, TAKE ONE TABLET BY MOUTH ONE TIME DAILY  Current Outpatient Medications (Respiratory):    fluticasone (FLONASE) 50 MCG/ACT nasal spray, Place 1 spray into both nostrils 2 (two) times daily.    loratadine (CLARITIN) 10 MG tablet, Take 10 mg by mouth daily.  Current Outpatient Medications (Analgesics):    allopurinol (ZYLOPRIM) 300 MG tablet, TAKE 1 TABLET BY MOUTH DAILY AS  DIRECTED   aspirin EC 81 MG tablet, Take 1 tablet (81 mg total) by mouth daily.   Current Outpatient Medications (Other):    pantoprazole (PROTONIX) 20 MG tablet, TAKE 1 TABLET BY MOUTH DAILY BEFORE BREAKFAST   PRESCRIPTION MEDICATION, Allergy shots every other week   Vitamin D, Ergocalciferol, (DRISDOL) 1.25 MG (50000 UNIT) CAPS capsule, Take 1  capsule (50,000 Units total) by mouth every 7 (seven) days.    Objective  Blood pressure 118/82, pulse (!) 56, height 5\' 9"  (1.753 m), SpO2 99 %.   General: No apparent distress alert and oriented x3 mood and affect normal,    Procedure: Real-time Ultrasound Guided Injection of right common extensor tendon Device: GE Logiq Q7 Ultrasound guided injection is preferred based studies that show increased duration, increased effect, greater accuracy, decreased procedural pain, increased response rate, and decreased cost with ultrasound guided versus blind injection.  Verbal informed consent obtained.  Time-out conducted.  Noted no overlying erythema, induration, or other signs of local infection.  Skin prepped in a sterile fashion.  Local anesthesia: Topical Ethyl chloride.  With sterile technique and under real time ultrasound guidance: With a 25-gauge half inch needle injected with 0.5 cc of 0.5% Marcaine then injected with 2-1/2 cc of PRP. Completed without difficulty  Pain immediately improved suggesting accurate placement of the medication.  Advised to call if fevers/chills, erythema, induration, drainage, or persistent bleeding.  Impression: Technically successful ultrasound guided injection.  Procedure: Real-time Ultrasound Guided Injection of left gluteal tendon sheath Device: GE Logiq Q7 Ultrasound guided injection is preferred based studies that show increased duration, increased effect, greater accuracy, decreased procedural pain, increased response rate, and decreased cost with ultrasound guided versus blind injection.  Verbal informed  consent obtained.  Time-out conducted.  Noted no overlying erythema, induration, or other signs of local infection.  Skin prepped in a sterile fashion.  Local anesthesia: Topical Ethyl chloride.  With sterile technique and under real time ultrasound guidance: With a 21-gauge 2 inch needle injected with 0.5 cc 0.5% Marcaine and injected 3 cc of PRP Completed without difficulty  Advised to call if fevers/chills, erythema, induration, drainage, or persistent bleeding.  Impression: Technically successful ultrasound guided injection.   Impression and Recommendations:      The above documentation has been reviewed and is accurate and complete Judi Saa, DO

## 2023-02-01 DIAGNOSIS — J3081 Allergic rhinitis due to animal (cat) (dog) hair and dander: Secondary | ICD-10-CM | POA: Diagnosis not present

## 2023-02-01 DIAGNOSIS — J3089 Other allergic rhinitis: Secondary | ICD-10-CM | POA: Diagnosis not present

## 2023-02-01 DIAGNOSIS — J301 Allergic rhinitis due to pollen: Secondary | ICD-10-CM | POA: Diagnosis not present

## 2023-02-06 ENCOUNTER — Ambulatory Visit: Payer: Self-pay | Admitting: Family Medicine

## 2023-02-06 ENCOUNTER — Encounter: Payer: Self-pay | Admitting: Family Medicine

## 2023-02-06 ENCOUNTER — Other Ambulatory Visit: Payer: Self-pay

## 2023-02-06 VITALS — BP 118/82 | HR 56 | Ht 69.0 in

## 2023-02-06 DIAGNOSIS — M7711 Lateral epicondylitis, right elbow: Secondary | ICD-10-CM

## 2023-02-06 DIAGNOSIS — M25521 Pain in right elbow: Secondary | ICD-10-CM

## 2023-02-06 DIAGNOSIS — S76012A Strain of muscle, fascia and tendon of left hip, initial encounter: Secondary | ICD-10-CM

## 2023-02-06 DIAGNOSIS — S76019A Strain of muscle, fascia and tendon of unspecified hip, initial encounter: Secondary | ICD-10-CM | POA: Insufficient documentation

## 2023-02-06 NOTE — Assessment & Plan Note (Signed)
Seen on MRI.  PRP given, post PRP instructions given.  Follow-up again 6 to 8 weeks

## 2023-02-06 NOTE — Patient Instructions (Signed)
No ice or ibuprofen for 3 days Tylenol and heat are ok Follow exercises as instructed Follow up in 6 weeks  

## 2023-02-06 NOTE — Assessment & Plan Note (Addendum)
Right lateral epicondylitis PRP given today.  Post PRP handout given.  Tolerated the procedure well.  Follow-up again in 6 to 8 weeks

## 2023-02-10 DIAGNOSIS — J301 Allergic rhinitis due to pollen: Secondary | ICD-10-CM | POA: Diagnosis not present

## 2023-02-10 DIAGNOSIS — J3089 Other allergic rhinitis: Secondary | ICD-10-CM | POA: Diagnosis not present

## 2023-02-10 DIAGNOSIS — J3081 Allergic rhinitis due to animal (cat) (dog) hair and dander: Secondary | ICD-10-CM | POA: Diagnosis not present

## 2023-02-15 DIAGNOSIS — J3081 Allergic rhinitis due to animal (cat) (dog) hair and dander: Secondary | ICD-10-CM | POA: Diagnosis not present

## 2023-02-15 DIAGNOSIS — J3089 Other allergic rhinitis: Secondary | ICD-10-CM | POA: Diagnosis not present

## 2023-02-15 DIAGNOSIS — J301 Allergic rhinitis due to pollen: Secondary | ICD-10-CM | POA: Diagnosis not present

## 2023-02-24 NOTE — Progress Notes (Deleted)
[name] [address] [place] [address] Phone: [phone] Subjective:    I'm seeing this patient by the request  of:  Philip Aspen, Limmie Patricia, MD  CC: Gluteal and elbow pain follow-up  [id number]  02/06/2023 Right lateral epicondylitis PRP given today.  Post PRP handout given.  Tolerated the procedure well.  Follow-up again in 6 to 8 weeks     Seen on MRI.  PRP given, post PRP instructions given.  Follow-up again 6 to 8 weeks     Updated 03/01/2023 Stanley [facility] is a 67 y.o. male coming in with complaint of pain in gluteal and elbow. PRP follow up for the elbow.  Patient states       Past Medical History:  Diagnosis Date   Allergy    seasonal allergies   Arthritis    generalized   Basal cell carcinoma    scalp   GERD (gastroesophageal reflux disease)    on meds   Gout    on gout   Hyperlipidemia    on meds   Past Surgical History:  Procedure Laterality Date   APPENDECTOMY     BASAL CELL CARCINOMA EXCISION     CERVICAL DISC SURGERY     COLONOSCOPY  05/2017   MAC-suprep(exc)-polyps   TONSILLECTOMY     UPPER GASTROINTESTINAL ENDOSCOPY  05/2017   normal   VASECTOMY     WISDOM TOOTH EXTRACTION     Social History   Socioeconomic History   Marital status: Married    Spouse name: Larita Fife   Number of children: 3   Years of education: Not on file   Highest education level: Bachelor's degree (e.g., BA, AB, BS)  Occupational History   Occupation: Brewing technologist    Comment: retired  Tobacco Use   Smoking status: Never   Smokeless tobacco: Never  Vaping Use   Vaping status: Never Used  Substance and Sexual Activity   Alcohol use: Yes    Alcohol/week: 5.0 standard drinks of alcohol    Types: 5 Standard drinks or equivalent per week    Comment: 5   Drug use: No   Sexual activity: Yes    Partners: Female  Other Topics Concern   Not on file  Social History Narrative   Work or School: Advertising account planner -  Administrator, 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      Home Situation: lives with wife      Spiritual Beliefs: Catholic      Lifestyle: regular exercise and healthy diet   Social Determinants of Health   Financial Resource Strain: Low Risk  (07/04/2022)   Overall Financial Resource Strain (CARDIA)    Difficulty of Paying Living Expenses: Not hard at all  Food Insecurity: No Food Insecurity (07/04/2022)   Hunger Vital Sign    Worried About Running Out of Food in the Last Year: Never true    Ran Out of Food in the Last Year: Never true  Transportation Needs: No Transportation Needs (07/04/2022)   PRAPARE - Administrator, Civil Service (Medical): No    Lack of Transportation (Non-Medical): No  Physical Activity: Sufficiently Active (07/04/2022)   Exercise Vital Sign    Days of Exercise per Week: 6 days    Minutes of Exercise per Session: 60 min  Stress: No Stress Concern Present (07/04/2022)   [name]    Feeling of Stress : Not at all  Social Connections: Socially Integrated (  07/04/2022)   Social Connection and Isolation Panel [NHANES]    Frequency of Communication with Friends and Family: More than three times a week    Frequency of Social Gatherings with Friends and Family: More than three times a week    Attends Religious Services: More than 4 times per year    Active Member of [employer] or Organizations: Yes    Attends Engineer, structural: More than 4 times per year    Marital Status: Married   Allergies  Allergen Reactions   Teriflunomide Other (See Comments)   Tamiflu [Oseltamivir] Rash    Mild rash 2018   Family History  Problem Relation Age of Onset   Arthritis Mother        osteo   Arthritis Father        osteo   Heart disease Father        (pneumonia cause of death 36 yrs)   Prostate cancer Father    Diabetes Sister    Ovarian cancer Sister    Other Brother        celiac sprue    Prostate cancer Brother 52   Kidney failure Cousin        pat cousin   Kidney failure Cousin        pat cousin   Colon cancer Neg Hx    Esophageal cancer Neg Hx    Rectal cancer Neg Hx    Colon polyps Neg Hx    Stomach cancer Neg Hx      Current Outpatient Medications (Cardiovascular):    EPINEPHrine 0.3 mg/0.3 mL IJ SOAJ injection, SMARTSIG:Injection As Directed   rosuvastatin (CRESTOR) 20 MG tablet, TAKE 1 TABLET BY MOUTH DAILY   tadalafil (CIALIS) 5 MG tablet, TAKE ONE TABLET BY MOUTH ONE TIME DAILY  Current Outpatient Medications (Respiratory):    fluticasone (FLONASE) 50 MCG/ACT nasal spray, Place 1 spray into both nostrils 2 (two) times daily.    loratadine (CLARITIN) 10 MG tablet, Take 10 mg by mouth daily.  Current Outpatient Medications (Analgesics):    allopurinol (ZYLOPRIM) 300 MG tablet, TAKE 1 TABLET BY MOUTH DAILY AS DIRECTED   aspirin EC 81 MG tablet, Take 1 tablet (81 mg total) by mouth daily.   Current Outpatient Medications (Other):    pantoprazole (PROTONIX) 20 MG tablet, TAKE 1 TABLET BY MOUTH DAILY BEFORE BREAKFAST   PRESCRIPTION MEDICATION, Allergy shots every other week   Vitamin D, Ergocalciferol, (DRISDOL) 1.25 MG (50000 UNIT) CAPS capsule, Take 1 capsule (50,000 Units total) by mouth every 7 (seven) days.   Reviewed prior external information including notes and imaging from  primary care provider As well as notes that were available from care everywhere and other healthcare systems.  Past medical history, social, surgical and family history all reviewed in electronic medical record.  No pertanent information unless stated regarding to the chief complaint.   Review of Systems:  No headache, visual changes, nausea, vomiting, diarrhea, constipation, dizziness, abdominal pain, skin rash, fevers, chills, night sweats, weight loss, swollen lymph nodes, body aches, joint swelling, chest pain, shortness of breath, mood changes. POSITIVE muscle  aches  Objective  There were no vitals taken for this visit.   General: No apparent distress alert and oriented x3 mood and affect normal, dressed appropriately.  HEENT: Pupils equal, extraocular movements intact  Respiratory: Patient's speak in full sentences and does not appear short of breath  Cardiovascular: No lower extremity edema, non tender, no erythema      Impression  and Recommendations:

## 2023-03-01 ENCOUNTER — Ambulatory Visit: Payer: PPO | Admitting: Family Medicine

## 2023-03-01 DIAGNOSIS — J3081 Allergic rhinitis due to animal (cat) (dog) hair and dander: Secondary | ICD-10-CM | POA: Diagnosis not present

## 2023-03-01 DIAGNOSIS — J301 Allergic rhinitis due to pollen: Secondary | ICD-10-CM | POA: Diagnosis not present

## 2023-03-01 DIAGNOSIS — J3089 Other allergic rhinitis: Secondary | ICD-10-CM | POA: Diagnosis not present

## 2023-03-02 ENCOUNTER — Ambulatory Visit: Payer: PPO | Admitting: Family Medicine

## 2023-03-02 VITALS — BP 110/70 | HR 52 | Ht 69.0 in | Wt 172.0 lb

## 2023-03-02 DIAGNOSIS — M533 Sacrococcygeal disorders, not elsewhere classified: Secondary | ICD-10-CM | POA: Insufficient documentation

## 2023-03-02 NOTE — Progress Notes (Signed)
[facility] [address] [place] [address] Phone: [phone] Subjective:    I'm seeing this patient by the request  of:  Philip Aspen, Stanley Patricia, MD  CC: Bilateral SI joint pain  [id number]  02/06/2023 Right lateral epicondylitis PRP given today. Post PRP handout given. Tolerated the procedure well. Follow-up again in 6 to 8 weeks   Updated 03/02/2023 Stanley Bennett is a 67 y.o. male coming in with complaint of B SI joint pain.  Hasn't changed. Where PRP went feel better. Elbow is about the same. Minimal improvement.       Past Medical History:  Diagnosis Date   Allergy    seasonal allergies   Arthritis    generalized   Basal cell carcinoma    scalp   GERD (gastroesophageal reflux disease)    on meds   Gout    on gout   Hyperlipidemia    on meds   Past Surgical History:  Procedure Laterality Date   APPENDECTOMY     BASAL CELL CARCINOMA EXCISION     CERVICAL DISC SURGERY     COLONOSCOPY  05/2017   MAC-suprep(exc)-polyps   TONSILLECTOMY     UPPER GASTROINTESTINAL ENDOSCOPY  05/2017   normal   VASECTOMY     WISDOM TOOTH EXTRACTION     Social History   Socioeconomic History   Marital status: Married    Spouse name: Stanley [facility]   Number of children: 3   Years of education: Not on file   Highest education level: Bachelor's degree (e.g., BA, AB, BS)  Occupational History   Occupation: Brewing technologist    Comment: retired  Tobacco Use   Smoking status: Never   Smokeless tobacco: Never  Vaping Use   Vaping status: Never Used  Substance and Sexual Activity   Alcohol use: Yes    Alcohol/week: 5.0 standard drinks of alcohol    Types: 5 Standard drinks or equivalent per week    Comment: 5   Drug use: No   Sexual activity: Yes    Partners: Female  Other Topics Concern   Not on file  Social History Narrative   Work or School: Advertising account planner - Administrator, 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      Home Situation: lives  with wife      Spiritual Beliefs: Catholic      Lifestyle: regular exercise and healthy diet   Social Determinants of Health   Financial Resource Strain: Low Risk  (07/04/2022)   Overall Financial Resource Strain (CARDIA)    Difficulty of Paying Living Expenses: Not hard at all  Food Insecurity: No Food Insecurity (07/04/2022)   Hunger Vital Sign    Worried About Running Out of Food in the Last Year: Never true    Ran Out of Food in the Last Year: Never true  Transportation Needs: No Transportation Needs (07/04/2022)   PRAPARE - Administrator, Civil Service (Medical): No    Lack of Transportation (Non-Medical): No  Physical Activity: Sufficiently Active (07/04/2022)   Exercise Vital Sign    Days of Exercise per Week: 6 days    Minutes of Exercise per Session: 60 min  Stress: No Stress Concern Present (07/04/2022)   [name]    Feeling of Stress : Not at all  Social Connections: Socially Integrated (07/04/2022)   Social Connection and Isolation Panel [NHANES]    Frequency of Communication with Friends and Family: More than three  times a week    Frequency of Social Gatherings with Friends and Family: More than three times a week    Attends Religious Services: More than 4 times per year    Active Member of [employer] or Organizations: Yes    Attends Engineer, structural: More than 4 times per year    Marital Status: Married   Allergies  Allergen Reactions   Teriflunomide Other (See Comments)   Tamiflu [Oseltamivir] Rash    Mild rash 2018   Family History  Problem Relation Age of Onset   Arthritis Mother        osteo   Arthritis Father        osteo   Heart disease Father        (pneumonia cause of death 75 yrs)   Prostate cancer Father    Diabetes Sister    Ovarian cancer Sister    Other Brother        celiac sprue   Prostate cancer Brother 33   Kidney failure Cousin        pat  cousin   Kidney failure Cousin        pat cousin   Colon cancer Neg Hx    Esophageal cancer Neg Hx    Rectal cancer Neg Hx    Colon polyps Neg Hx    Stomach cancer Neg Hx      Current Outpatient Medications (Cardiovascular):    EPINEPHrine 0.3 mg/0.3 mL IJ SOAJ injection, SMARTSIG:Injection As Directed   rosuvastatin (CRESTOR) 20 MG tablet, TAKE 1 TABLET BY MOUTH DAILY   tadalafil (CIALIS) 5 MG tablet, TAKE ONE TABLET BY MOUTH ONE TIME DAILY  Current Outpatient Medications (Respiratory):    fluticasone (FLONASE) 50 MCG/ACT nasal spray, Place 1 spray into both nostrils 2 (two) times daily.    loratadine (CLARITIN) 10 MG tablet, Take 10 mg by mouth daily.  Current Outpatient Medications (Analgesics):    allopurinol (ZYLOPRIM) 300 MG tablet, TAKE 1 TABLET BY MOUTH DAILY AS DIRECTED   aspirin EC 81 MG tablet, Take 1 tablet (81 mg total) by mouth daily.   Current Outpatient Medications (Other):    pantoprazole (PROTONIX) 20 MG tablet, TAKE 1 TABLET BY MOUTH DAILY BEFORE BREAKFAST   PRESCRIPTION MEDICATION, Allergy shots every other week   Vitamin D, Ergocalciferol, (DRISDOL) 1.25 MG (50000 UNIT) CAPS capsule, Take 1 capsule (50,000 Units total) by mouth every 7 (seven) days.   Reviewed prior external information including notes and imaging from  primary care provider As well as notes that were available from care everywhere and other healthcare systems.  Past medical history, social, surgical and family history all reviewed in electronic medical record.  No pertanent information unless stated regarding to the chief complaint.   Review of Systems:  No headache, visual changes, nausea, vomiting, diarrhea, constipation, dizziness, abdominal pain, skin rash, fevers, chills, night sweats, weight loss, swollen lymph nodes, body aches, joint swelling, chest pain, shortness of breath, mood changes. POSITIVE muscle aches  Objective  Blood pressure 110/70, pulse (!) 52, height 5\' 9"  (1.753  m), weight 172 lb (78 kg), SpO2 96%.   General: No apparent distress alert and oriented x3 mood and affect normal, dressed appropriately.  HEENT: Pupils equal, extraocular movements intact  Respiratory: Patient's speak in full sentences and does not appear short of breath  Cardiovascular: No lower extremity edema, non tender, no erythema  Neck exam does have tenderness to palpation over the sacroiliac joint.  Patient does have positive FABER  test bilaterally.  Worsening pain with extension of the back  After verbal consent patient was prepped with alcohol swab and with a 21-gauge 2 inch needle injected into the right sacroiliac joint with a total of 0.5 cc of 0.5% Marcaine and 1 cc of Kenalog 40 mg/mL.  No blood loss.  Band-Aid placed.  Postinjection instructions given.   After verbal consent patient was prepped with alcohol swab and with a 21-gauge 2 inch needle injected with left sacroiliac joint with a total of 0.5 cc of 0.5% Marcaine and 1 cc of Kenalog 40 mg/mL.  No blood loss.  Band-Aid placed.  Postinjection instructions given    Impression and Recommendations:    The above documentation has been reviewed and is accurate and complete Judi Saa, DO

## 2023-03-02 NOTE — Patient Instructions (Addendum)
See you again in 5-6 weeks Okay to start walking 50% and increase 25% per week Injection in SI joints today

## 2023-03-02 NOTE — Assessment & Plan Note (Signed)
Patient has responded well to the PRP for the gluteal tendon but continues to have the axial pain over the sacroiliac joints and some mild swelling feels today.  Bilateral injections given today and tolerated the procedure well.  Discussed with patient to start increasing and titrating up on his walking again.  Continue the home exercises.  We will see him again in 4 to 5 weeks to follow-up on this as well as his lateral epicondylitis status post PRP.

## 2023-03-08 DIAGNOSIS — J3089 Other allergic rhinitis: Secondary | ICD-10-CM | POA: Diagnosis not present

## 2023-03-08 DIAGNOSIS — J301 Allergic rhinitis due to pollen: Secondary | ICD-10-CM | POA: Diagnosis not present

## 2023-03-08 DIAGNOSIS — J3081 Allergic rhinitis due to animal (cat) (dog) hair and dander: Secondary | ICD-10-CM | POA: Diagnosis not present

## 2023-03-15 DIAGNOSIS — J3081 Allergic rhinitis due to animal (cat) (dog) hair and dander: Secondary | ICD-10-CM | POA: Diagnosis not present

## 2023-03-15 DIAGNOSIS — J301 Allergic rhinitis due to pollen: Secondary | ICD-10-CM | POA: Diagnosis not present

## 2023-03-15 DIAGNOSIS — J3089 Other allergic rhinitis: Secondary | ICD-10-CM | POA: Diagnosis not present

## 2023-03-17 ENCOUNTER — Encounter (HOSPITAL_BASED_OUTPATIENT_CLINIC_OR_DEPARTMENT_OTHER): Payer: Self-pay | Admitting: Cardiology

## 2023-03-17 ENCOUNTER — Ambulatory Visit (HOSPITAL_BASED_OUTPATIENT_CLINIC_OR_DEPARTMENT_OTHER): Payer: PPO | Admitting: Cardiology

## 2023-03-17 VITALS — BP 124/76 | HR 52 | Ht 69.0 in | Wt 169.3 lb

## 2023-03-17 DIAGNOSIS — E7800 Pure hypercholesterolemia, unspecified: Secondary | ICD-10-CM

## 2023-03-17 DIAGNOSIS — I6522 Occlusion and stenosis of left carotid artery: Secondary | ICD-10-CM

## 2023-03-17 DIAGNOSIS — I493 Ventricular premature depolarization: Secondary | ICD-10-CM | POA: Diagnosis not present

## 2023-03-17 DIAGNOSIS — I2510 Atherosclerotic heart disease of native coronary artery without angina pectoris: Secondary | ICD-10-CM

## 2023-03-17 DIAGNOSIS — Z7189 Other specified counseling: Secondary | ICD-10-CM | POA: Diagnosis not present

## 2023-03-17 NOTE — Progress Notes (Signed)
Cardiology Office Note:  .    Date:  03/17/2023  ID:  Jashad, Depaula 1956-07-02, MRN [id number] PCP: Philip Aspen, Limmie Patricia, MD  [facility] Providers Cardiologist:  Jodelle Red, MD     History of Present Illness: .    Stanley Bennett is a 67 y.o. male with a hx of PVCs, hyperlipidemia, family history of heart disease who is seen for follow up. I initially met him 10/29/19 as a new consult at the request of No ref. provider found for the evaluation and management of chest discomfort and variable heart rate.   In 08/2021 he had an acute decreased heart rate to the 30's. He was sitting in a chair when his heart rate dropped. It elevated back to normal after about 15 minutes. He reported he was sick that week. He had a syncopal episode the previous Saturday that he associated with dehydration. He was standing up when he suddenly felt dizzy and woke up on the ground. He felt better after drinking water. He had occasional episodes of positional dizziness starting several weeks prior.   At his visit 06/2022, he denied recurring palpitations since mid-September. He had not been able to determine any clear triggers or correlations, and he didn't start the diltiazem. Didn't need his nitroglycerin for 2 years. Felt better off of metoprolol.   Today, he states he is feeling well from a cardiovascular perspective. May have had 1-2 episodes of brief palpitations but nothing significant. No chest discomfort.  Since 6/24 he has not been able to exercise much given issues with tendinosis and inflammation of SI joints. Only recently he has been able to start working on increasing his exercise and activity.  He denies any chest pain, shortness of breath, peripheral edema, lightheadedness, headaches, syncope, orthopnea, or PND.  ROS:  Please see the history of present illness. ROS otherwise negative except as noted.   Studies Reviewed: Marland Kitchen    EKG  Interpretation Date/Time:  Friday March 17 2023 10:12:44 EDT Ventricular Rate:  52 PR Interval:  174 QRS Duration:  74 QT Interval:  432 QTC Calculation: 401 R Axis:   -19  Text Interpretation: Sinus bradycardia Confirmed by Jodelle Red [id number]) on 03/17/2023 10:50:25 AM    Physical Exam:    VS:  BP 124/76 (BP Location: Left Arm, Patient Position: Sitting, Cuff Size: Normal)   Pulse (!) 52   Ht 5\' 9"  (1.753 m)   Wt 169 lb 4.8 oz (76.8 kg)   BMI 25.00 kg/m    Wt Readings from Last 3 Encounters:  03/17/23 169 lb 4.8 oz (76.8 kg)  03/02/23 172 lb (78 kg)  01/04/23 160 lb (72.6 kg)    GEN: Well nourished, well developed in no acute distress HEENT: Normal, moist mucous membranes NECK: No JVD CARDIAC: regular rhythm, normal S1 and S2, no rubs or gallops. No murmur. VASCULAR: Radial and DP pulses 2+ bilaterally. No carotid bruits RESPIRATORY:  Clear to auscultation without rales, wheezing or rhonchi  ABDOMEN: Soft, non-tender, non-distended MUSCULOSKELETAL:  Ambulates independently SKIN: Warm and dry, no edema NEUROLOGIC:  Alert and oriented x 3. No focal neuro deficits noted. PSYCHIATRIC:  Normal affect   ASSESSMENT AND PLAN: .    Palpitations PVCs, frequent History of Syncope -PVC burden >10%. -feels much better off of metoprolol. Never started diltiazem -as he is asymptomatic and not limited in his activity currently, would hold on nodal agents. If he develops worsening symptoms would need to re-evaluate management of his PVCs -  echo with atrial enlargement, otherwise unremarkable -has Kardiamobile -overall symptoms very minimal to absent currently   Nonobstructive CAD with prior chest pain:  -no further symptoms -tolerating aspirin 81 mg daily -tolerating rosuvastatin 20 mg daily -counseled on red flag warning signs that need immediate medical attention. Has never used nitro, declines refill but will contact me if he develops symptoms    Hypercholesterolemia: -tolerating rosuvastatin 20 mg daily -lipids 02/19/20: Tchol 157, HDL 68, LDL 75, TG 65 -lipids 03/01/21: Tchol 176, HDL 85, LDL 81, TG 47 -lipids 04/13/22: Tchol 157, HDL 66, LDL 78, TG 62 -Goal LDL <70. Tolerating current meds, feels well, no changes today   Family history of heart disease: secondary prevention as below   Cardiac risk counseling and prevention recommendations: -recommend heart healthy/Mediterranean diet, with whole grains, fruits, vegetable, fish, lean meats, nuts, and olive oil. Limit salt. -recommend moderate walking, 3-5 times/week for 30-50 minutes each session. Aim for at least 150 minutes.week. Goal should be pace of 3 miles/hours, or walking 1.5 miles in 30 minutes -recommend avoidance of tobacco products. Avoid excess alcohol.  Dispo: Follow-up in 6 months, or sooner as needed.  I,Mathew Stumpf,acting as a Neurosurgeon for [employer], MD.,have documented all relevant documentation on the behalf of Jodelle Red, MD,as directed by  Jodelle Red, MD while in the presence of Jodelle Red, MD.  I, Jodelle Red, MD, have reviewed all documentation for this visit. The documentation on 03/17/23 for the exam, diagnosis, procedures, and orders are all accurate and complete.   Signed, Jodelle Red, MD

## 2023-03-17 NOTE — Patient Instructions (Signed)
Medication Instructions:  The current medical regimen is effective;  continue present plan and medications.   *If you need a refill on your cardiac medications before your next appointment, please call your pharmacy*   Lab Work: None  Testing/Procedures: None   Follow-Up: At [facility], you and your health needs are our priority.  As part of our continuing mission to provide you with exceptional heart care, we have created designated Provider Care Teams.  These Care Teams include your primary Cardiologist (physician) and Advanced Practice Providers (APPs -  Physician Assistants and Nurse Practitioners) who all work together to provide you with the care you need, when you need it.  We recommend signing up for the patient portal called "MyChart".  Sign up information is provided on this After Visit Summary.  MyChart is used to connect with patients for Virtual Visits (Telemedicine).  Patients are able to view lab/test results, encounter notes, upcoming appointments, etc.  Non-urgent messages can be sent to your provider as well.   To learn more about what you can do with MyChart, go to [email or web address].    Your next appointment:   6 month(s)  Provider:   Bridgette Christopher, MD    Other Instructions None  

## 2023-03-20 ENCOUNTER — Other Ambulatory Visit: Payer: Self-pay | Admitting: Internal Medicine

## 2023-03-20 DIAGNOSIS — N401 Enlarged prostate with lower urinary tract symptoms: Secondary | ICD-10-CM

## 2023-03-28 DIAGNOSIS — J3081 Allergic rhinitis due to animal (cat) (dog) hair and dander: Secondary | ICD-10-CM | POA: Diagnosis not present

## 2023-03-28 DIAGNOSIS — J3089 Other allergic rhinitis: Secondary | ICD-10-CM | POA: Diagnosis not present

## 2023-03-28 DIAGNOSIS — J301 Allergic rhinitis due to pollen: Secondary | ICD-10-CM | POA: Diagnosis not present

## 2023-03-30 ENCOUNTER — Encounter (INDEPENDENT_AMBULATORY_CARE_PROVIDER_SITE_OTHER): Payer: Self-pay

## 2023-03-31 ENCOUNTER — Other Ambulatory Visit: Payer: Self-pay | Admitting: Oncology

## 2023-03-31 DIAGNOSIS — Z006 Encounter for examination for normal comparison and control in clinical research program: Secondary | ICD-10-CM

## 2023-04-05 DIAGNOSIS — J3081 Allergic rhinitis due to animal (cat) (dog) hair and dander: Secondary | ICD-10-CM | POA: Diagnosis not present

## 2023-04-05 DIAGNOSIS — J3089 Other allergic rhinitis: Secondary | ICD-10-CM | POA: Diagnosis not present

## 2023-04-05 DIAGNOSIS — J301 Allergic rhinitis due to pollen: Secondary | ICD-10-CM | POA: Diagnosis not present

## 2023-04-06 ENCOUNTER — Other Ambulatory Visit: Payer: Self-pay | Admitting: Internal Medicine

## 2023-04-06 ENCOUNTER — Other Ambulatory Visit (HOSPITAL_BASED_OUTPATIENT_CLINIC_OR_DEPARTMENT_OTHER): Payer: Self-pay | Admitting: Cardiology

## 2023-04-06 DIAGNOSIS — E7800 Pure hypercholesterolemia, unspecified: Secondary | ICD-10-CM

## 2023-04-06 NOTE — Telephone Encounter (Signed)
Rx request sent to pharmacy.  

## 2023-04-10 NOTE — Progress Notes (Unsigned)
[name] [address] [place] [address] Phone: [phone] Subjective:   [name], am serving as a scribe for Dr. Antoine Primas.  I'm seeing this patient by the request  of:  Philip Aspen, Limmie Patricia, MD  CC: Elbow and left leg pain  [id number]  02/06/2023 Seen on MRI.  PRP given, post PRP instructions given.  Follow-up again 6 to 8 weeks   Right lateral epicondylitis PRP given today.  Post PRP handout given.  Tolerated the procedure well.  Follow-up again in 6 to 8 weeks      Update 04/11/2023 Stanley Bennett is a 67 y.o. male coming in with complaint of R elbow and L glute pain. Patient states elbow no pain. Back is still troublesome. PRP in hip feels like it might have help. Pain is different. Pain is a little lower goes low back to groin to hip. Intensity has decreased.      Past Medical History:  Diagnosis Date   Allergy    seasonal allergies   Arthritis    generalized   Basal cell carcinoma    scalp   GERD (gastroesophageal reflux disease)    on meds   Gout    on gout   Hyperlipidemia    on meds   Past Surgical History:  Procedure Laterality Date   APPENDECTOMY     BASAL CELL CARCINOMA EXCISION     CERVICAL DISC SURGERY     COLONOSCOPY  05/2017   MAC-suprep(exc)-polyps   TONSILLECTOMY     UPPER GASTROINTESTINAL ENDOSCOPY  05/2017   normal   VASECTOMY     WISDOM TOOTH EXTRACTION     Social History   Socioeconomic History   Marital status: Married    Spouse name: Larita Fife   Number of children: 3   Years of education: Not on file   Highest education level: Bachelor's degree (e.g., BA, AB, BS)  Occupational History   Occupation: Brewing technologist    Comment: retired  Tobacco Use   Smoking status: Never   Smokeless tobacco: Never  Vaping Use   Vaping status: Never Used  Substance and Sexual Activity   Alcohol use: Yes    Alcohol/week: 5.0 standard drinks of alcohol    Types: 5  Standard drinks or equivalent per week    Comment: 5   Drug use: No   Sexual activity: Yes    Partners: Female  Other Topics Concern   Not on file  Social History Narrative   Work or School: Advertising account planner - Administrator, 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      Home Situation: lives with wife      Spiritual Beliefs: Catholic      Lifestyle: regular exercise and healthy diet   Social Determinants of Health   Financial Resource Strain: Low Risk  (07/04/2022)   Overall Financial Resource Strain (CARDIA)    Difficulty of Paying Living Expenses: Not hard at all  Food Insecurity: No Food Insecurity (07/04/2022)   Hunger Vital Sign    Worried About Running Out of Food in the Last Year: Never true    Ran Out of Food in the Last Year: Never true  Transportation Needs: No Transportation Needs (07/04/2022)   PRAPARE - Administrator, Civil Service (Medical): No    Lack of Transportation (Non-Medical): No  Physical Activity: Sufficiently Active (07/04/2022)   Exercise Vital Sign    Days of Exercise per Week: 6 days    Minutes of  Exercise per Session: 60 min  Stress: No Stress Concern Present (07/04/2022)   [name]    Feeling of Stress : Not at all  Social Connections: Socially Integrated (07/04/2022)   Social Connection and Isolation Panel [NHANES]    Frequency of Communication with Friends and Family: More than three times a week    Frequency of Social Gatherings with Friends and Family: More than three times a week    Attends Religious Services: More than 4 times per year    Active Member of [employer] or Organizations: Yes    Attends Engineer, structural: More than 4 times per year    Marital Status: Married   Allergies  Allergen Reactions   Teriflunomide Other (See Comments)   Tamiflu [Oseltamivir] Rash    Mild rash 2018   Family History  Problem Relation Age of Onset   Arthritis Mother        osteo    Arthritis Father        osteo   Heart disease Father        (pneumonia cause of death 46 yrs)   Prostate cancer Father    Diabetes Sister    Ovarian cancer Sister    Other Brother        celiac sprue   Prostate cancer Brother 32   Kidney failure Cousin        pat cousin   Kidney failure Cousin        pat cousin   Colon cancer Neg Hx    Esophageal cancer Neg Hx    Rectal cancer Neg Hx    Colon polyps Neg Hx    Stomach cancer Neg Hx      Current Outpatient Medications (Cardiovascular):    EPINEPHrine 0.3 mg/0.3 mL IJ SOAJ injection, SMARTSIG:Injection As Directed   rosuvastatin (CRESTOR) 20 MG tablet, Take 1 tablet (20 mg total) by mouth daily. TAKE 1 TABLET BY MOUTH DAILY   tadalafil (CIALIS) 5 MG tablet, TAKE ONE TABLET BY MOUTH ONCE A DAY  Current Outpatient Medications (Respiratory):    fluticasone (FLONASE) 50 MCG/ACT nasal spray, Place 1 spray into both nostrils 2 (two) times daily.    loratadine (CLARITIN) 10 MG tablet, Take 10 mg by mouth daily.  Current Outpatient Medications (Analgesics):    allopurinol (ZYLOPRIM) 300 MG tablet, TAKE 1 TABLET BY MOUTH DAILY AS DIRECTED   aspirin EC 81 MG tablet, Take 1 tablet (81 mg total) by mouth daily.   Current Outpatient Medications (Other):    pantoprazole (PROTONIX) 20 MG tablet, TAKE 1 TABLET BY MOUTH DAILY BEFORE BREAKFAST   PRESCRIPTION MEDICATION, Allergy shots every other week   Vitamin D, Ergocalciferol, (DRISDOL) 1.25 MG (50000 UNIT) CAPS capsule, Take 1 capsule (50,000 Units total) by mouth every 7 (seven) days.   Reviewed prior external information including notes and imaging from  primary care provider As well as notes that were available from care everywhere and other healthcare systems.  Past medical history, social, surgical and family history all reviewed in electronic medical record.  No pertanent information unless stated regarding to the chief complaint.   Review of Systems:  No headache, visual  changes, nausea, vomiting, diarrhea, constipation, dizziness, abdominal pain, skin rash, fevers, chills, night sweats, weight loss, swollen lymph nodes, body aches, joint swelling, chest pain, shortness of breath, mood changes. POSITIVE muscle aches  Objective  Blood pressure 116/72, pulse (!) 55, height 5\' 9"  (1.753 m), weight 168 lb (  76.2 kg), SpO2 98%.   General: No apparent distress alert and oriented x3 mood and affect normal, dressed appropriately.  HEENT: Pupils equal, extraocular movements intact  Respiratory: Patient's speak in full sentences and does not appear short of breath  Cardiovascular: No lower extremity edema, non tender, no erythema  Right elbow exam shows the patient has improvement noted.  The patient does have no tenderness.  Good grip strength noted.  Left gluteal tendon still has significant discomfort noted.  No significant weakness though noted.  Tightness of the piriformis noted.  Procedure: Real-time Ultrasound Guided Injection of left gluteal tendon sheath Device: GE Logiq Q7 Ultrasound guided injection is preferred based studies that show increased duration, increased effect, greater accuracy, decreased procedural pain, increased response rate, and decreased cost with ultrasound guided versus blind injection.  Verbal informed consent obtained.  Time-out conducted.  Noted no overlying erythema, induration, or other signs of local infection.  Skin prepped in a sterile fashion.  Local anesthesia: Topical Ethyl chloride.  With sterile technique and under real time ultrasound guidance: With a 22-gauge 2-1/2 half inch needle injected with 0.5 cc of 0.5% Marcaine and then injected with 1 cc of Kenalog 40 mg/mL Completed without difficulty  Pain immediately resolved suggesting accurate placement of the medication.  Advised to call if fevers/chills, erythema, induration, drainage, or persistent bleeding.  Impression: Technically successful ultrasound guided  injection.    Impression and Recommendations:

## 2023-04-11 ENCOUNTER — Ambulatory Visit: Payer: PPO | Admitting: Family Medicine

## 2023-04-11 ENCOUNTER — Other Ambulatory Visit: Payer: Self-pay

## 2023-04-11 ENCOUNTER — Encounter: Payer: Self-pay | Admitting: Family Medicine

## 2023-04-11 VITALS — BP 116/72 | HR 55 | Ht 69.0 in | Wt 168.0 lb

## 2023-04-11 DIAGNOSIS — M25521 Pain in right elbow: Secondary | ICD-10-CM | POA: Diagnosis not present

## 2023-04-11 DIAGNOSIS — S76012A Strain of muscle, fascia and tendon of left hip, initial encounter: Secondary | ICD-10-CM

## 2023-04-11 DIAGNOSIS — M7711 Lateral epicondylitis, right elbow: Secondary | ICD-10-CM | POA: Diagnosis not present

## 2023-04-11 NOTE — Patient Instructions (Addendum)
Injection in hip today Good to see you! Keep working on strength today See you again in 2-3 months

## 2023-04-11 NOTE — Assessment & Plan Note (Signed)
Patient is fully healed which is beyond what I would anticipate at this time.  Patient should do great with conservative therapy and can follow-up with me as needed

## 2023-04-11 NOTE — Assessment & Plan Note (Signed)
Seem to be some reactive tendinitis still noted.  Did seem healthier when he looks at the tendon on ultrasound.  Hopeful that this will make significant improvement.  Follow-up with me again in 6 to 8 weeks.

## 2023-04-18 ENCOUNTER — Other Ambulatory Visit (INDEPENDENT_AMBULATORY_CARE_PROVIDER_SITE_OTHER): Payer: PPO

## 2023-04-18 DIAGNOSIS — Z006 Encounter for examination for normal comparison and control in clinical research program: Secondary | ICD-10-CM

## 2023-04-18 DIAGNOSIS — E538 Deficiency of other specified B group vitamins: Secondary | ICD-10-CM

## 2023-04-18 DIAGNOSIS — E7800 Pure hypercholesterolemia, unspecified: Secondary | ICD-10-CM | POA: Diagnosis not present

## 2023-04-18 DIAGNOSIS — Z0000 Encounter for general adult medical examination without abnormal findings: Secondary | ICD-10-CM

## 2023-04-18 DIAGNOSIS — R7989 Other specified abnormal findings of blood chemistry: Secondary | ICD-10-CM

## 2023-04-18 DIAGNOSIS — D509 Iron deficiency anemia, unspecified: Secondary | ICD-10-CM

## 2023-04-18 LAB — CBC WITH DIFFERENTIAL/PLATELET
Basophils Absolute: 0 10*3/uL (ref 0.0–0.1)
Basophils Relative: 0.5 % (ref 0.0–3.0)
Eosinophils Absolute: 0 10*3/uL (ref 0.0–0.7)
Eosinophils Relative: 0.5 % (ref 0.0–5.0)
HCT: 47.4 % (ref 39.0–52.0)
Hemoglobin: 15.3 g/dL (ref 13.0–17.0)
Lymphocytes Relative: 19.1 % (ref 12.0–46.0)
Lymphs Abs: 1.4 10*3/uL (ref 0.7–4.0)
MCHC: 32.2 g/dL (ref 30.0–36.0)
MCV: 94.6 fl (ref 78.0–100.0)
Monocytes Absolute: 0.6 10*3/uL (ref 0.1–1.0)
Monocytes Relative: 7.9 % (ref 3.0–12.0)
Neutro Abs: 5.3 10*3/uL (ref 1.4–7.7)
Neutrophils Relative %: 72 % (ref 43.0–77.0)
Platelets: 279 10*3/uL (ref 150.0–400.0)
RBC: 5.01 Mil/uL (ref 4.22–5.81)
RDW: 14.5 % (ref 11.5–15.5)
WBC: 7.3 10*3/uL (ref 4.0–10.5)

## 2023-04-18 LAB — COMPREHENSIVE METABOLIC PANEL
ALT: 30 U/L (ref 0–53)
AST: 25 U/L (ref 0–37)
Albumin: 4 g/dL (ref 3.5–5.2)
Alkaline Phosphatase: 56 U/L (ref 39–117)
BUN: 14 mg/dL (ref 6–23)
CO2: 28 meq/L (ref 19–32)
Calcium: 9.2 mg/dL (ref 8.4–10.5)
Chloride: 101 meq/L (ref 96–112)
Creatinine, Ser: 0.82 mg/dL (ref 0.40–1.50)
GFR: 90.02 mL/min (ref >60.00)
Glucose, Bld: 80 mg/dL (ref 70–99)
Potassium: 4.3 meq/L (ref 3.5–5.1)
Sodium: 138 meq/L (ref 135–145)
Total Bilirubin: 1.2 mg/dL (ref 0.2–1.2)
Total Protein: 6.7 g/dL (ref 6.0–8.3)

## 2023-04-18 LAB — LIPID PANEL
Cholesterol: 163 mg/dL (ref 0–200)
HDL: 72.5 mg/dL (ref >39.00)
LDL Cholesterol: 77 mg/dL (ref 0–99)
NonHDL: 90.3
Total CHOL/HDL Ratio: 2
Triglycerides: 69 mg/dL (ref 0.0–149.0)
VLDL: 13.8 mg/dL (ref 0.0–40.0)

## 2023-04-18 NOTE — Addendum Note (Signed)
Addended by: Donald Pore A on: 04/18/2023 08:18 AM   Modules accepted: Orders

## 2023-04-18 NOTE — Addendum Note (Signed)
Addended by: Donald Pore A on: 04/18/2023 08:14 AM   Modules accepted: Orders

## 2023-04-18 NOTE — Addendum Note (Signed)
Addended by: Donald Pore A on: 04/18/2023 08:16 AM   Modules accepted: Orders

## 2023-04-19 LAB — VITAMIN B12: Vitamin B-12: 268 pg/mL (ref 211–911)

## 2023-04-19 LAB — PSA: PSA: 1.5 ng/mL (ref 0.10–4.00)

## 2023-04-19 LAB — VITAMIN D 25 HYDROXY (VIT D DEFICIENCY, FRACTURES): VITD: 26.74 ng/mL — ABNORMAL LOW (ref 30.00–100.00)

## 2023-04-19 LAB — TSH: TSH: 3.04 u[IU]/mL (ref 0.35–5.50)

## 2023-04-20 DIAGNOSIS — J3089 Other allergic rhinitis: Secondary | ICD-10-CM | POA: Diagnosis not present

## 2023-04-20 DIAGNOSIS — J3081 Allergic rhinitis due to animal (cat) (dog) hair and dander: Secondary | ICD-10-CM | POA: Diagnosis not present

## 2023-04-20 DIAGNOSIS — J301 Allergic rhinitis due to pollen: Secondary | ICD-10-CM | POA: Diagnosis not present

## 2023-04-24 ENCOUNTER — Ambulatory Visit (INDEPENDENT_AMBULATORY_CARE_PROVIDER_SITE_OTHER): Payer: PPO | Admitting: Internal Medicine

## 2023-04-24 VITALS — BP 124/78 | HR 58 | Temp 98.3°F | Ht 70.0 in | Wt 167.1 lb

## 2023-04-24 DIAGNOSIS — Z0000 Encounter for general adult medical examination without abnormal findings: Secondary | ICD-10-CM | POA: Diagnosis not present

## 2023-04-24 DIAGNOSIS — R3912 Poor urinary stream: Secondary | ICD-10-CM | POA: Diagnosis not present

## 2023-04-24 DIAGNOSIS — H9190 Unspecified hearing loss, unspecified ear: Secondary | ICD-10-CM | POA: Diagnosis not present

## 2023-04-24 DIAGNOSIS — R7989 Other specified abnormal findings of blood chemistry: Secondary | ICD-10-CM | POA: Diagnosis not present

## 2023-04-24 DIAGNOSIS — E538 Deficiency of other specified B group vitamins: Secondary | ICD-10-CM | POA: Diagnosis not present

## 2023-04-24 DIAGNOSIS — N401 Enlarged prostate with lower urinary tract symptoms: Secondary | ICD-10-CM

## 2023-04-24 DIAGNOSIS — J301 Allergic rhinitis due to pollen: Secondary | ICD-10-CM | POA: Diagnosis not present

## 2023-04-24 DIAGNOSIS — D509 Iron deficiency anemia, unspecified: Secondary | ICD-10-CM

## 2023-04-24 DIAGNOSIS — E7800 Pure hypercholesterolemia, unspecified: Secondary | ICD-10-CM | POA: Diagnosis not present

## 2023-04-24 DIAGNOSIS — J3089 Other allergic rhinitis: Secondary | ICD-10-CM | POA: Diagnosis not present

## 2023-04-24 DIAGNOSIS — J3081 Allergic rhinitis due to animal (cat) (dog) hair and dander: Secondary | ICD-10-CM | POA: Diagnosis not present

## 2023-04-24 MED ORDER — VITAMIN D (ERGOCALCIFEROL) 1.25 MG (50000 UNIT) PO CAPS
50000.0000 [IU] | ORAL_CAPSULE | ORAL | 0 refills | Status: DC
Start: 2023-04-24 — End: 2023-12-05

## 2023-04-24 MED ORDER — TADALAFIL 5 MG PO TABS
5.0000 mg | ORAL_TABLET | Freq: Every day | ORAL | 0 refills | Status: AC
Start: 2023-04-24 — End: ?

## 2023-04-24 NOTE — Progress Notes (Signed)
Established Patient Office Visit     CC/Reason for Visit: Annual preventive exam and subsequent Medicare wellness visit  HPI: Stanley Bennett is a 67 y.o. male who is coming in today for the above mentioned reasons. Past Medical History is significant for: Iron deficiency anemia, hyperlipidemia, vitamin D deficiency, aortic atherosclerosis on a statin.  Feeling well without concerns or complaints.  Recently received COVID-vaccine.  Will be getting flu vaccine at a later date, had a colonoscopy in 2022.   Past Medical/Surgical History: Past Medical History:  Diagnosis Date   Allergy    seasonal allergies   Arthritis    generalized   Basal cell carcinoma    scalp   GERD (gastroesophageal reflux disease)    on meds   Gout    on gout   Hyperlipidemia    on meds    Past Surgical History:  Procedure Laterality Date   APPENDECTOMY     BASAL CELL CARCINOMA EXCISION     CERVICAL DISC SURGERY     COLONOSCOPY  05/2017   MAC-suprep(exc)-polyps   TONSILLECTOMY     UPPER GASTROINTESTINAL ENDOSCOPY  05/2017   normal   VASECTOMY     WISDOM TOOTH EXTRACTION      Social History:  reports that he has never smoked. He has never used smokeless tobacco. He reports current alcohol use of about 5.0 standard drinks of alcohol per week. He reports that he does not use drugs.  Allergies: Allergies  Allergen Reactions   Teriflunomide Other (See Comments)   Tamiflu [Oseltamivir] Rash    Mild rash 2018    Family History:  Family History  Problem Relation Age of Onset   Arthritis Mother        osteo   Arthritis Father        osteo   Heart disease Father        (pneumonia cause of death 21 yrs)   Prostate cancer Father    Diabetes Sister    Ovarian cancer Sister    Other Brother        celiac sprue   Prostate cancer Brother 23   Kidney failure Cousin        pat cousin   Kidney failure Cousin        pat cousin   Colon cancer Neg Hx    Esophageal cancer Neg Hx     Rectal cancer Neg Hx    Colon polyps Neg Hx    Stomach cancer Neg Hx      Current Outpatient Medications:    allopurinol (ZYLOPRIM) 300 MG tablet, TAKE 1 TABLET BY MOUTH DAILY AS DIRECTED, Disp: 90 tablet, Rfl: 1   aspirin EC 81 MG tablet, Take 1 tablet (81 mg total) by mouth daily., Disp: 90 tablet, Rfl: 3   EPINEPHrine 0.3 mg/0.3 mL IJ SOAJ injection, SMARTSIG:Injection As Directed, Disp: , Rfl:    fluticasone (FLONASE) 50 MCG/ACT nasal spray, Place 1 spray into both nostrils 2 (two) times daily. , Disp: , Rfl:    loratadine (CLARITIN) 10 MG tablet, Take 10 mg by mouth daily., Disp: , Rfl:    pantoprazole (PROTONIX) 20 MG tablet, TAKE 1 TABLET BY MOUTH DAILY BEFORE BREAKFAST, Disp: 90 tablet, Rfl: 1   PRESCRIPTION MEDICATION, Allergy shots every other week, Disp: , Rfl:    rosuvastatin (CRESTOR) 20 MG tablet, Take 1 tablet (20 mg total) by mouth daily. TAKE 1 TABLET BY MOUTH DAILY, Disp: 90 tablet, Rfl: 1   Vitamin  D, Ergocalciferol, (DRISDOL) 1.25 MG (50000 UNIT) CAPS capsule, Take 1 capsule (50,000 Units total) by mouth every 7 (seven) days for 12 doses., Disp: 12 capsule, Rfl: 0   tadalafil (CIALIS) 5 MG tablet, Take 1 tablet (5 mg total) by mouth daily., Disp: 30 tablet, Rfl: 0  Review of Systems:  Negative unless indicated in HPI.   Physical Exam: Vitals:   04/24/23 1100  BP: 124/78  Pulse: (!) 58  Temp: 98.3 F (36.8 C)  TempSrc: Oral  SpO2: 98%  Weight: 167 lb 1.6 oz (75.8 kg)  Height: 5\' 10"  (1.778 m)    Body mass index is 23.98 kg/m.   Physical Exam Vitals reviewed.  Constitutional:      General: He is not in acute distress.    Appearance: Normal appearance. He is not ill-appearing, toxic-appearing or diaphoretic.  HENT:     Head: Normocephalic.     Right Ear: Tympanic membrane, ear canal and external ear normal. There is no impacted cerumen.     Left Ear: Tympanic membrane, ear canal and external ear normal. There is no impacted cerumen.     Nose: Nose  normal.     Mouth/Throat:     Mouth: Mucous membranes are moist.     Pharynx: Oropharynx is clear. No oropharyngeal exudate or posterior oropharyngeal erythema.  Eyes:     General: No scleral icterus.       Right eye: No discharge.        Left eye: No discharge.     Conjunctiva/sclera: Conjunctivae normal.     Pupils: Pupils are equal, round, and reactive to light.  Neck:     Vascular: No carotid bruit.  Cardiovascular:     Rate and Rhythm: Normal rate and regular rhythm.     Pulses: Normal pulses.     Heart sounds: Normal heart sounds.  Pulmonary:     Effort: Pulmonary effort is normal. No respiratory distress.     Breath sounds: Normal breath sounds.  Abdominal:     General: Abdomen is flat. Bowel sounds are normal.     Palpations: Abdomen is soft.  Musculoskeletal:        General: Normal range of motion.     Cervical back: Normal range of motion.  Skin:    General: Skin is warm and dry.  Neurological:     General: No focal deficit present.     Mental Status: He is alert and oriented to person, place, and time. Mental status is at baseline.  Psychiatric:        Mood and Affect: Mood normal.        Behavior: Behavior normal.        Thought Content: Thought content normal.        Judgment: Judgment normal.    Subsequent Medicare wellness visit   1. Risk factors, based on past  M,S,F - Cardiac Risk Factors include: advanced age (>8men, >15 women)   2.  Physical activities: Dietary issues and exercise activities discussed:      3.  Depression/mood:  Flowsheet Row Office Visit from 04/24/2023 in [facility] Total Score 1        4.  ADL's:    04/24/2023   10:59 AM 07/04/2022    2:41 PM  In your present state of health, do you have any difficulty performing the following activities:  Hearing? 1 0  Comment some hearing loss   Vision? 0 0  Difficulty concentrating or  making decisions? 0 0  Walking or climbing stairs? 0 0   Dressing or bathing? 0 0  Doing errands, shopping? 0 0  Preparing Food and eating ? N N  Using the Toilet? N N  In the past six months, have you accidently leaked urine? N N  Do you have problems with loss of bowel control? N N  Managing your Medications? N N  Managing your Finances? N N  Housekeeping or managing your Housekeeping? N N     5.  Fall risk:     01/18/2022    1:29 PM 04/19/2022   10:35 AM 07/04/2022    2:41 PM 10/18/2022   10:56 AM 04/24/2023   11:02 AM  Fall Risk  Falls in the past year? 0 0 0 0 0  Was there an injury with Fall? 0 0 0 0 0  Fall Risk Category Calculator 0 0 0 0 0  Fall Risk Category (Retired) Low Low Low    (RETIRED) Patient Fall Risk Level Low fall risk Low fall risk Low fall risk    Patient at Risk for Falls Due to No Fall Risks No Fall Risks No Fall Risks    Fall risk Follow up Falls evaluation completed Falls evaluation completed Falls prevention discussed Falls evaluation completed Falls evaluation completed     6.  Home safety: No problems identified   7.  Height weight, and visual acuity: height and weight as above, vision/hearing: Vision Screening   Right eye Left eye Both eyes  Without correction     With correction 20/40 20/40 20/40      8.  Counseling: Counseling given: Not Answered    9. Lab orders based on risk factors: Laboratory update will be reviewed   10. Cognitive assessment:        04/24/2023   11:03 AM 07/04/2022    2:42 PM 06/28/2021    1:48 PM 06/17/2020    9:47 AM  6CIT Screen  What Year? 0 points 0 points 0 points 0 points  What month? 0 points 0 points 0 points 0 points  What time? 0 points 0 points 0 points   Count back from 20 0 points 0 points 0 points 0 points  Months in reverse 0 points 0 points 0 points 0 points  Repeat phrase 0 points 0 points 0 points 0 points  Total Score 0 points 0 points 0 points      11. Screening: Patient provided with a written and personalized 5-10 year screening schedule in  the AVS. Health Maintenance  Topic Date Due   Flu Shot  11/13/2023*   COVID-19 Vaccine (7 - 2023-24 season) 06/06/2023   Medicare Annual Wellness Visit  04/23/2024   Colon Cancer Screening  08/25/2025   DTaP/Tdap/Td vaccine (3 - Td or Tdap) 11/23/2026   Pneumonia Vaccine  Completed   Hepatitis C Screening  Completed   Zoster (Shingles) Vaccine  Completed   HPV Vaccine  Aged Out  *Topic was postponed. The date shown is not the original due date.    12. Provider List Update: Patient Care Team    Relationship Specialty Notifications Start End  Philip Aspen, Limmie Patricia, MD PCP - General Internal Medicine  01/18/22   Jodelle Red, MD PCP - Cardiology Cardiology  07/11/22   [facility]    12/28/18    Comment: kristin snyder, derm     13. Advance Directives: Does Patient Have a Medical Advance Directive?: Yes Type of Advance Directive: Out of  facility DNR (pink MOST or yellow form), Living will, Healthcare Power of Attorney Does patient want to make changes to medical advance directive?: No - Patient declined Copy of Healthcare Power of Attorney in Chart?: No - copy requested  14. Opioids: Patient is not on any opioid prescriptions and has no risk factors for a substance use disorder.   15.   Goals       Become More Active      Timeframe:  Long-Range Goal Priority:  Medium Start Date:                             Expected End Date:                       Follow Up Date 04/23/2024    - use fitness equipment at home    Why is this important?   It is easy to come up with reasons not to exercise.  These steps will help you get started and have fun doing it.    Notes:       Patient Stated (pt-stated)      Maintain weight.      Patient Stated      06/28/2021, remain active         I have personally reviewed and noted the following in the patient's chart:   Medical and social history Use of alcohol, tobacco or illicit drugs  Current medications  and supplements Functional ability and status Nutritional status Physical activity Advanced directives List of other physicians Hospitalizations, surgeries, and ER visits in previous 12 months Vitals Screenings to include cognitive, depression, and falls Referrals and appointments  In addition, I have reviewed and discussed with patient certain preventive protocols, quality metrics, and best practice recommendations. A written personalized care plan for preventive services as well as general preventive health recommendations were provided to patient.   Impression and Plan:  Encounter for Medicare annual wellness exam  Pure hypercholesterolemia  Iron deficiency anemia, unspecified iron deficiency anemia type  Low serum vitamin B12  Low serum vitamin D -     Vitamin D (Ergocalciferol); Take 1 capsule (50,000 Units total) by mouth every 7 (seven) days for 12 doses.  Dispense: 12 capsule; Refill: 0  Benign prostatic hyperplasia with weak urinary stream -     Tadalafil; Take 1 tablet (5 mg total) by mouth daily.  Dispense: 30 tablet; Refill: 0  Hearing loss, unspecified hearing loss type, unspecified laterality -     Ambulatory referral to Audiology  -Recommend routine eye and dental care. -Healthy lifestyle discussed in detail. -Labs to be updated today. -Prostate cancer screening: PSA today Health Maintenance  Topic Date Due   Flu Shot  11/13/2023*   COVID-19 Vaccine (7 - 2023-24 season) 06/06/2023   Medicare Annual Wellness Visit  04/23/2024   Colon Cancer Screening  08/25/2025   DTaP/Tdap/Td vaccine (3 - Td or Tdap) 11/23/2026   Pneumonia Vaccine  Completed   Hepatitis C Screening  Completed   Zoster (Shingles) Vaccine  Completed   HPV Vaccine  Aged Out  *Topic was postponed. The date shown is not the original due date.      -Will update flu vaccine at a later date. -Referral to audiology placed.     Chaya Jan, MD [facility] at  [facility]

## 2023-05-01 LAB — GENECONNECT MOLECULAR SCREEN: Genetic Analysis Overall Interpretation: NEGATIVE

## 2023-05-11 DIAGNOSIS — J3089 Other allergic rhinitis: Secondary | ICD-10-CM | POA: Diagnosis not present

## 2023-05-11 DIAGNOSIS — J301 Allergic rhinitis due to pollen: Secondary | ICD-10-CM | POA: Diagnosis not present

## 2023-05-11 DIAGNOSIS — J3081 Allergic rhinitis due to animal (cat) (dog) hair and dander: Secondary | ICD-10-CM | POA: Diagnosis not present

## 2023-05-12 ENCOUNTER — Other Ambulatory Visit: Payer: Self-pay | Admitting: Internal Medicine

## 2023-05-12 DIAGNOSIS — N401 Enlarged prostate with lower urinary tract symptoms: Secondary | ICD-10-CM

## 2023-05-29 ENCOUNTER — Ambulatory Visit: Payer: PPO | Admitting: Audiologist

## 2023-05-29 DIAGNOSIS — J3081 Allergic rhinitis due to animal (cat) (dog) hair and dander: Secondary | ICD-10-CM | POA: Diagnosis not present

## 2023-05-29 DIAGNOSIS — J3089 Other allergic rhinitis: Secondary | ICD-10-CM | POA: Diagnosis not present

## 2023-05-29 DIAGNOSIS — J301 Allergic rhinitis due to pollen: Secondary | ICD-10-CM | POA: Diagnosis not present

## 2023-06-06 DIAGNOSIS — J301 Allergic rhinitis due to pollen: Secondary | ICD-10-CM | POA: Diagnosis not present

## 2023-06-06 DIAGNOSIS — J3081 Allergic rhinitis due to animal (cat) (dog) hair and dander: Secondary | ICD-10-CM | POA: Diagnosis not present

## 2023-06-06 DIAGNOSIS — J3089 Other allergic rhinitis: Secondary | ICD-10-CM | POA: Diagnosis not present

## 2023-06-08 NOTE — Progress Notes (Signed)
[name] [address] [place] [address] Phone: [phone] Subjective:   Stanley Bennett, am serving as a scribe for Dr. Antoine Primas.  I'm seeing this patient by the request  of:  Stanley Bennett, Stanley Patricia, MD  CC: bilateral joint si Joint   [id number]  04/11/2023 Patient is fully healed which is beyond what I would anticipate at this time.  Patient should do great with conservative therapy and can follow-up with me as needed     Seem to be some reactive tendinitis still noted.  Did seem healthier when he looks at the tendon on ultrasound.  Hopeful that this will make significant improvement.  Follow-up with me again in 6 to 8 weeks.     Update 06/13/2023 Stanley Bennett is a 67 y.o. male coming in with complaint of R elbow and L hip pain. Patient states that his elbow is doing much better.  Pain in B SI joints. Injections were effective initially. Pain seems to be occurring more often than not. Radiates down both legs, L>R. Pain felt in hamstring and quad especially at night and when he walks for exercise. Elliptical does not bother his leg.       Past Medical History:  Diagnosis Date   Allergy    seasonal allergies   Arthritis    generalized   Basal cell carcinoma    scalp   GERD (gastroesophageal reflux disease)    on meds   Gout    on gout   Hyperlipidemia    on meds   Past Surgical History:  Procedure Laterality Date   APPENDECTOMY     BASAL CELL CARCINOMA EXCISION     CERVICAL DISC SURGERY     COLONOSCOPY  05/2017   MAC-suprep(exc)-polyps   TONSILLECTOMY     UPPER GASTROINTESTINAL ENDOSCOPY  05/2017   normal   VASECTOMY     WISDOM TOOTH EXTRACTION     Social History   Socioeconomic History   Marital status: Married    Spouse name: Larita Fife   Number of children: 3   Years of education: Not on file   Highest education level: Bachelor's degree (e.g., BA, AB, BS)  Occupational History   Occupation:  Brewing technologist    Comment: retired  Tobacco Use   Smoking status: Never   Smokeless tobacco: Never  Vaping Use   Vaping status: Never Used  Substance and Sexual Activity   Alcohol use: Yes    Alcohol/week: 5.0 standard drinks of alcohol    Types: 5 Standard drinks or equivalent per week    Comment: 5   Drug use: No   Sexual activity: Yes    Partners: Female  Other Topics Concern   Not on file  Social History Narrative   Work or School: Advertising account planner - Administrator, 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      Home Situation: lives with wife      Spiritual Beliefs: Catholic      Lifestyle: regular exercise and healthy diet   Social Determinants of Health   Financial Resource Strain: Low Risk  (04/24/2023)   Overall Financial Resource Strain (CARDIA)    Difficulty of Paying Living Expenses: Not hard at all  Food Insecurity: No Food Insecurity (04/24/2023)   Hunger Vital Sign    Worried About Running Out of Food in the Last Year: Never true    Ran Out of Food in the Last Year: Never true  Transportation Needs: No Transportation Needs (04/24/2023)  PRAPARE - Administrator, Civil Service (Medical): No    Lack of Transportation (Non-Medical): No  Physical Activity: Sufficiently Active (04/24/2023)   Exercise Vital Sign    Days of Exercise per Week: 6 days    Minutes of Exercise per Session: 60 min  Stress: No Stress Concern Present (04/24/2023)   [name]    Feeling of Stress : Not at all  Social Connections: Socially Integrated (04/24/2023)   Social Connection and Isolation Panel [NHANES]    Frequency of Communication with Friends and Family: More than three times a week    Frequency of Social Gatherings with Friends and Family: More than three times a week    Attends Religious Services: More than 4 times per year    Active Member of [employer] or Organizations: Yes    Attends Engineer, structural: More  than 4 times per year    Marital Status: Married   Allergies  Allergen Reactions   Teriflunomide Other (See Comments)   Tamiflu [Oseltamivir] Rash    Mild rash 2018   Family History  Problem Relation Age of Onset   Arthritis Mother        osteo   Arthritis Father        osteo   Heart disease Father        (pneumonia cause of death 75 yrs)   Prostate cancer Father    Diabetes Sister    Ovarian cancer Sister    Other Brother        celiac sprue   Prostate cancer Brother 66   Kidney failure Cousin        pat cousin   Kidney failure Cousin        pat cousin   Colon cancer Neg Hx    Esophageal cancer Neg Hx    Rectal cancer Neg Hx    Colon polyps Neg Hx    Stomach cancer Neg Hx      Current Outpatient Medications (Cardiovascular):    EPINEPHrine 0.3 mg/0.3 mL IJ SOAJ injection, SMARTSIG:Injection As Directed   rosuvastatin (CRESTOR) 20 MG tablet, Take 1 tablet (20 mg total) by mouth daily. TAKE 1 TABLET BY MOUTH DAILY   tadalafil (CIALIS) 5 MG tablet, TAKE ONE TABLET BY MOUTH ONCE A DAY  Current Outpatient Medications (Respiratory):    fluticasone (FLONASE) 50 MCG/ACT nasal spray, Place 1 spray into both nostrils 2 (two) times daily.    loratadine (CLARITIN) 10 MG tablet, Take 10 mg by mouth daily.  Current Outpatient Medications (Analgesics):    allopurinol (ZYLOPRIM) 300 MG tablet, TAKE 1 TABLET BY MOUTH DAILY AS DIRECTED   aspirin EC 81 MG tablet, Take 1 tablet (81 mg total) by mouth daily.   Current Outpatient Medications (Other):    pantoprazole (PROTONIX) 20 MG tablet, TAKE 1 TABLET BY MOUTH DAILY BEFORE BREAKFAST   PRESCRIPTION MEDICATION, Allergy shots every other week   Vitamin D, Ergocalciferol, (DRISDOL) 1.25 MG (50000 UNIT) CAPS capsule, Take 1 capsule (50,000 Units total) by mouth every 7 (seven) days for 12 doses.   Reviewed prior external information including notes and imaging from  primary care provider As well as notes that were available from  care everywhere and other healthcare systems.  Past medical history, social, surgical and family history all reviewed in electronic medical record.  No pertanent information unless stated regarding to the chief complaint.   Review of Systems:  No headache, visual changes, nausea,  vomiting, diarrhea, constipation, dizziness, abdominal pain, skin rash, fevers, chills, night sweats, weight loss, swollen lymph nodes, body aches, joint swelling, chest pain, shortness of breath, mood changes. POSITIVE muscle aches  Objective  Blood pressure 120/82, pulse 74, height 5\' 10"  (1.778 m), weight 171 lb (77.6 kg), SpO2 98%.   General: No apparent distress alert and oriented x3 mood and affect normal, dressed appropriately.  HEENT: Pupils equal, extraocular movements intact  Respiratory: Patient's speak in full sentences and does not appear short of breath  Legs do have swelling, pitting edema noted of the lower extremities.  1+ DTR noted.  Distal pulses seem to be symmetric but may be down in the dorsalis pedis Right lateral epicondylar area is nontender on exam.  Good strength of the wrist with resisted extension.  Limited muscular skeletal ultrasound was performed and interpreted by Antoine Primas, M  Limited ultrasound shows no significant hypoechoic changes.  No increasing in Doppler flow.  Seems to be relatively normal. Impression: Interval improvement     Impression and Recommendations:     The above documentation has been reviewed and is accurate and complete Judi Saa, DO

## 2023-06-13 ENCOUNTER — Encounter: Payer: Self-pay | Admitting: Family Medicine

## 2023-06-13 ENCOUNTER — Other Ambulatory Visit: Payer: Self-pay

## 2023-06-13 ENCOUNTER — Ambulatory Visit: Payer: PPO | Admitting: Family Medicine

## 2023-06-13 VITALS — BP 120/82 | HR 74 | Ht 70.0 in | Wt 171.0 lb

## 2023-06-13 DIAGNOSIS — R29898 Other symptoms and signs involving the musculoskeletal system: Secondary | ICD-10-CM

## 2023-06-13 DIAGNOSIS — I6522 Occlusion and stenosis of left carotid artery: Secondary | ICD-10-CM

## 2023-06-13 DIAGNOSIS — M7711 Lateral epicondylitis, right elbow: Secondary | ICD-10-CM | POA: Diagnosis not present

## 2023-06-13 DIAGNOSIS — M25521 Pain in right elbow: Secondary | ICD-10-CM | POA: Diagnosis not present

## 2023-06-13 NOTE — Patient Instructions (Addendum)
[facility] will call you [facility]  [facility]  ([facility] [employer] in [facility]) [address] [place], [place] [address] [phone] We will be in touch

## 2023-06-13 NOTE — Assessment & Plan Note (Signed)
Has had difficulty with carotid calcifications previously.  Will see if there is any other difficulties of the lower extremities with a nonobstructive atherosclerosis of the coronary arteries also in his history.  Concerned that this could be potentially contributing.  Will also get nerve conduction studies of the lower extremities to see if anything else is potentially contributing.  Continuing to have cramping noted as well.  Follow-up again in 6 to 8 weeks

## 2023-06-13 NOTE — Assessment & Plan Note (Signed)
Completely resolved at this time.  Patient is now 5 months out from the PRP.  Very hopeful that patient will continue to make improvement.  Follow-up with me again in 6 to 8 weeks otherwise

## 2023-06-16 ENCOUNTER — Ambulatory Visit (HOSPITAL_COMMUNITY)
Admission: RE | Admit: 2023-06-16 | Discharge: 2023-06-16 | Disposition: A | Discharge status: Home / Self Care | Payer: PPO | Source: Ambulatory Visit | Attending: Family Medicine | Admitting: Family Medicine

## 2023-06-16 DIAGNOSIS — M79652 Pain in left thigh: Secondary | ICD-10-CM | POA: Diagnosis not present

## 2023-06-16 DIAGNOSIS — M79651 Pain in right thigh: Secondary | ICD-10-CM

## 2023-06-16 DIAGNOSIS — R29898 Other symptoms and signs involving the musculoskeletal system: Secondary | ICD-10-CM

## 2023-06-17 LAB — VAS US LOWER EXT ART SEG MULTI (SEGMENTALS & LE RAYNAUDS)
Left ABI: 1.19
Right ABI: 1.28

## 2023-06-19 DIAGNOSIS — J3089 Other allergic rhinitis: Secondary | ICD-10-CM | POA: Diagnosis not present

## 2023-06-19 DIAGNOSIS — J301 Allergic rhinitis due to pollen: Secondary | ICD-10-CM | POA: Diagnosis not present

## 2023-06-19 DIAGNOSIS — J3081 Allergic rhinitis due to animal (cat) (dog) hair and dander: Secondary | ICD-10-CM | POA: Diagnosis not present

## 2023-06-20 ENCOUNTER — Encounter: Payer: Self-pay | Admitting: Neurology

## 2023-06-27 ENCOUNTER — Ambulatory Visit: Payer: PPO | Attending: Internal Medicine | Admitting: Audiologist

## 2023-06-27 DIAGNOSIS — J3081 Allergic rhinitis due to animal (cat) (dog) hair and dander: Secondary | ICD-10-CM | POA: Diagnosis not present

## 2023-06-27 DIAGNOSIS — J3089 Other allergic rhinitis: Secondary | ICD-10-CM | POA: Diagnosis not present

## 2023-06-27 DIAGNOSIS — J301 Allergic rhinitis due to pollen: Secondary | ICD-10-CM | POA: Diagnosis not present

## 2023-06-27 DIAGNOSIS — H903 Sensorineural hearing loss, bilateral: Secondary | ICD-10-CM | POA: Diagnosis not present

## 2023-06-27 NOTE — Procedures (Signed)
  Outpatient Audiology and [facility] [address] [place], [place]  [address] [phone]  AUDIOLOGICAL  EVALUATION  NAME: Stanley Bennett     DOB:   20-Jan-1956      MRN: [id number]                                                                                     DATE: 06/27/2023     REFERENT: Philip Aspen, Limmie Patricia, MD STATUS: Outpatient DIAGNOSIS: Sensorineural Hearing Loss   History: [name] was seen for an audiological evaluation due to his wife's concerns for his hearing. He also states its difficult to hear people in background noise. If he sees people face, he can hear them better. [name] has no pain or pressure today. He has occasional pain and tinnitus bilaterally. He has previously had hearing tests with [facility], no hearing aids recommended.    Evaluation:  Otoscopy showed a clear view of the tympanic membranes, bilaterally Tympanometry results were consistent with normal middle ear function, bilaterally   Audiometric testing was completed using Conventional Audiometry techniques with insert earphones and TDH headphones. Test results are consistent with normal hearing sloping to a mild sensorineural  hearing loss bilaterally  3-8kHz.Marland Kitchen Speech Recognition Thresholds were obtained at  200dB HL in the right ear and at 15dB HL in the left ear. Word Recognition Testing was completed at  40dB SL and [name] scored 100% in the right ear and 96% in the left.  Quick Speech in Noise Test (QuickSIN):  list of six sentences with five key words per sentence is presented in four-talker babble noise. The sentences are presented at pre- recorded signal-to-noise ratios which decrease in 5-dB steps from 25 (very easy) to 0 (extremely difficult). The SNRs used are: 25, 20, 15, 10, 5 and 0, encompassing normal to severely impaired performance in noise. [name] scored in normal range. Hearing still adequate in the presence of noise.   Results:  The test results were  reviewed with [name]. He has a mild high frequency hearing loss. He needs to see faces to hear clearly since he is lipreading a few higher pitched sounds like /f/ and /s/. He does not need hearing aids yet but recommend annual hearing testing. He understood over 90% of words at conversational level. He did great on the speech in noise test.  Recommendations: 1.   Annual hearing tests to monitor loss for progression. No hearing aids necessary.    32 minutes spent testing and counseling on results.   If you have any questions please feel free to contact me at [phone]. Ammie Ferrier Au.D.  Audiologist   06/27/2023  2:27 PM  Cc: Philip Aspen, Limmie Patricia, MD

## 2023-07-06 ENCOUNTER — Other Ambulatory Visit: Payer: Self-pay

## 2023-07-06 DIAGNOSIS — R202 Paresthesia of skin: Secondary | ICD-10-CM

## 2023-07-07 ENCOUNTER — Ambulatory Visit (INDEPENDENT_AMBULATORY_CARE_PROVIDER_SITE_OTHER): Payer: PPO

## 2023-07-07 VITALS — Ht 70.0 in | Wt 171.0 lb

## 2023-07-07 DIAGNOSIS — Z0000 Encounter for general adult medical examination without abnormal findings: Secondary | ICD-10-CM

## 2023-07-07 NOTE — Progress Notes (Signed)
Subjective:   Stanley Bennett is a 67 y.o. male who presents for Medicare Annual/Subsequent preventive examination.  Visit Complete: Virtual I connected with  Stanley Bennett on 07/07/23 by a audio enabled telemedicine application and verified that I am speaking with the correct person using two identifiers.  Patient Location: Home  Provider Location: Home Office  I discussed the limitations of evaluation and management by telemedicine. The patient expressed understanding and agreed to proceed.  Vital Signs: Because this visit was a virtual/telehealth visit, some criteria may be missing or patient reported. Any vitals not documented were not able to be obtained and vitals that have been documented are patient reported.    Cardiac Risk Factors include: advanced age (>46men, >68 women);male gender     Objective:    Today's Vitals   07/07/23 1431  Weight: 171 lb (77.6 kg)  Height: 5\' 10"  (1.778 m)   Body mass index is 24.54 kg/m.     07/07/2023    2:37 PM 04/24/2023   11:02 AM 07/04/2022    2:42 PM 07/25/2021   12:48 AM 06/28/2021    1:47 PM 06/09/2021   11:11 AM 06/17/2020    9:43 AM  Advanced Directives  Does Patient Have a Medical Advance Directive? Yes Yes Yes No Yes Yes Yes  Type of Estate agent of [place];Living will Out of facility DNR (pink MOST or yellow form);Living will;Healthcare Power of [employer] of [place];Living will  Healthcare Power of [place];Living will Healthcare Power of [place];Living will Healthcare Power of [place];Living will  Does patient want to make changes to medical advance directive?  No - Patient declined    No - Patient declined   Copy of Healthcare Power of Attorney in Chart? No - copy requested No - copy requested No - copy requested  No - copy requested  No - copy requested    Current Medications (verified) Outpatient Encounter Medications as of 07/07/2023  Medication Sig   allopurinol  (ZYLOPRIM) 300 MG tablet TAKE 1 TABLET BY MOUTH DAILY AS DIRECTED   aspirin EC 81 MG tablet Take 1 tablet (81 mg total) by mouth daily.   EPINEPHrine 0.3 mg/0.3 mL IJ SOAJ injection SMARTSIG:Injection As Directed   fluticasone (FLONASE) 50 MCG/ACT nasal spray Place 1 spray into both nostrils 2 (two) times daily.    loratadine (CLARITIN) 10 MG tablet Take 10 mg by mouth daily.   pantoprazole (PROTONIX) 20 MG tablet TAKE 1 TABLET BY MOUTH DAILY BEFORE BREAKFAST   PRESCRIPTION MEDICATION Allergy shots every other week   rosuvastatin (CRESTOR) 20 MG tablet Take 1 tablet (20 mg total) by mouth daily. TAKE 1 TABLET BY MOUTH DAILY   tadalafil (CIALIS) 5 MG tablet TAKE ONE TABLET BY MOUTH ONCE A DAY   Vitamin D, Ergocalciferol, (DRISDOL) 1.25 MG (50000 UNIT) CAPS capsule Take 1 capsule (50,000 Units total) by mouth every 7 (seven) days for 12 doses.   No facility-administered encounter medications on file as of 07/07/2023.    Allergies (verified) Teriflunomide and Tamiflu [oseltamivir]   History: Past Medical History:  Diagnosis Date   Allergy    seasonal allergies   Arthritis    generalized   Basal cell carcinoma    scalp   GERD (gastroesophageal reflux disease)    on meds   Gout    on gout   Hyperlipidemia    on meds   Past Surgical History:  Procedure Laterality Date   APPENDECTOMY     BASAL CELL  CARCINOMA EXCISION     CERVICAL DISC SURGERY     COLONOSCOPY  05/2017   MAC-suprep(exc)-polyps   TONSILLECTOMY     UPPER GASTROINTESTINAL ENDOSCOPY  05/2017   normal   VASECTOMY     WISDOM TOOTH EXTRACTION     Family History  Problem Relation Age of Onset   Arthritis Mother        osteo   Arthritis Father        osteo   Heart disease Father        (pneumonia cause of death 67 yrs)   Prostate cancer Father    Diabetes Sister    Ovarian cancer Sister    Other Brother        celiac sprue   Prostate cancer Brother 70   Kidney failure Cousin        pat cousin   Kidney  failure Cousin        pat cousin   Colon cancer Neg Hx    Esophageal cancer Neg Hx    Rectal cancer Neg Hx    Colon polyps Neg Hx    Stomach cancer Neg Hx    Social History   Socioeconomic History   Marital status: Married    Spouse name: Stanley Bennett   Number of children: 3   Years of education: Not on file   Highest education level: Bachelor's degree (e.g., BA, AB, BS)  Occupational History   Occupation: Brewing technologist    Comment: retired  Tobacco Use   Smoking status: Never   Smokeless tobacco: Never  Vaping Use   Vaping status: Never Used  Substance and Sexual Activity   Alcohol use: Yes    Alcohol/week: 5.0 standard drinks of alcohol    Types: 5 Standard drinks or equivalent per week    Comment: 5   Drug use: No   Sexual activity: Yes    Partners: Female  Other Topics Concern   Not on file  Social History Narrative   Work or School: Advertising account planner - Administrator, 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      Home Situation: lives with wife      Spiritual Beliefs: Catholic      Lifestyle: regular exercise and healthy diet   Social Determinants of Health   Financial Resource Strain: Low Risk  (07/07/2023)   Overall Financial Resource Strain (CARDIA)    Difficulty of Paying Living Expenses: Not hard at all  Food Insecurity: No Food Insecurity (07/07/2023)   Hunger Vital Sign    Worried About Running Out of Food in the Last Year: Never true    Ran Out of Food in the Last Year: Never true  Transportation Needs: No Transportation Needs (07/07/2023)   PRAPARE - Administrator, Civil Service (Medical): No    Lack of Transportation (Non-Medical): No  Physical Activity: Sufficiently Active (07/07/2023)   Exercise Vital Sign    Days of Exercise per Week: 6 days    Minutes of Exercise per Session: 60 min  Stress: No Stress Concern Present (07/07/2023)   [name]    Feeling of Stress : Not at all   Social Connections: Socially Integrated (07/07/2023)   Social Connection and Isolation Panel [NHANES]    Frequency of Communication with Friends and Family: More than three times a week    Frequency of Social Gatherings with Friends and Family: More than three times a week    Attends Religious Services: More than 4 times  per year    Active Member of Clubs or Organizations: Yes    Attends Banker Meetings: More than 4 times per year    Marital Status: Married    Tobacco Counseling Counseling given: Not Answered   Clinical Intake:  Pre-visit preparation completed: Yes  Pain : No/denies pain     BMI - recorded: 24.54 Nutritional Status: BMI of 19-24  Normal Nutritional Risks: None Diabetes: No  How often do you need to have someone help you when you read instructions, pamphlets, or other written materials from your doctor or pharmacy?: 1 - Never  Interpreter Needed?: No  Information entered by :: Theresa Mulligan LPN   Activities of Daily Living    07/07/2023    2:36 PM 04/24/2023   10:59 AM  In your present state of health, do you have any difficulty performing the following activities:  Hearing? 0 1  Comment  some hearing loss  Vision? 0 0  Difficulty concentrating or making decisions? 0 0  Walking or climbing stairs? 0 0  Dressing or bathing? 0 0  Doing errands, shopping? 0 0  Preparing Food and eating ? N N  Using the Toilet? N N  In the past six months, have you accidently leaked urine? N N  Do you have problems with loss of bowel control? N N  Managing your Medications? N N  Managing your Finances? N N  Housekeeping or managing your Housekeeping? N N    Patient Care Team: Philip Aspen, Limmie Patricia, MD as PCP - General (Internal Medicine) Jodelle Red, MD as PCP - Cardiology (Cardiology) Center, [facility]  Indicate any recent Medical Services you may have received from other than [facility] providers in the past year (date may be  approximate).     Assessment:   This is a routine wellness examination for Mart.  Hearing/Vision screen Hearing Screening - Comments:: Denies hearing difficulties   Vision Screening - Comments:: Wears rx glasses - up to date with routine eye exams with  [facility]   Goals Addressed               This Visit's Progress     Patient Stated (pt-stated)        Maintain weight.       Depression Screen    07/07/2023    2:35 PM 04/24/2023   11:14 AM 04/24/2023   11:02 AM 10/18/2022   10:56 AM 07/04/2022    2:40 PM 04/19/2022   10:35 AM 01/18/2022    1:27 PM  PHQ 2/9 Scores  PHQ - 2 Score 0 0 0 0 0 0 0  PHQ- 9 Score  1  2  2 7     Fall Risk    07/07/2023    2:36 PM 04/24/2023   11:02 AM 10/18/2022   10:56 AM 07/04/2022    2:41 PM 04/19/2022   10:35 AM  Fall Risk   Falls in the past year? 0 0 0 0 0  Number falls in past yr: 0 0 0 0 0  Injury with Fall? 0 0 0 0 0  Risk for fall due to : No Fall Risks   No Fall Risks No Fall Risks  Follow up Falls prevention discussed Falls evaluation completed Falls evaluation completed Falls prevention discussed Falls evaluation completed    MEDICARE RISK AT HOME: Medicare Risk at Home Any stairs in or around the home?: Yes If so, are there any without handrails?: No Home free of loose throw  rugs in walkways, pet beds, electrical cords, etc?: Yes Adequate lighting in your home to reduce risk of falls?: Yes Life alert?: No Use of a cane, walker or w/c?: No Grab bars in the bathroom?: No Shower chair or bench in shower?: No Elevated toilet seat or a handicapped toilet?: No  TIMED UP AND GO:  Was the test performed?  No    Cognitive Function:        07/07/2023    2:37 PM 04/24/2023   11:03 AM 07/04/2022    2:42 PM 06/28/2021    1:48 PM 06/17/2020    9:47 AM  6CIT Screen  What Year? 0 points 0 points 0 points 0 points 0 points  What month? 0 points 0 points 0 points 0 points 0 points  What time? 0 points 0 points 0 points 0 points    Count back from 20 0 points 0 points 0 points 0 points 0 points  Months in reverse 0 points 0 points 0 points 0 points 0 points  Repeat phrase 0 points 0 points 0 points 0 points 0 points  Total Score 0 points 0 points 0 points 0 points     Immunizations Immunization History  Administered Date(s) Administered   Influenza, High Dose Seasonal PF 10/07/2016, 11/20/2019   Influenza-Unspecified 05/27/2010, 05/14/2013, 05/26/2015, 05/15/2017, 06/03/2017, 05/17/2018, 05/19/2019, 05/05/2020, 05/17/2021, 05/16/2023   Moderna SARS-COV2 Booster Vaccination 04/11/2023   PFIZER(Purple Top)SARS-COV-2 Vaccination 09/04/2019, 09/22/2019, 11/20/2019, 12/07/2020, 04/11/2023   PNEUMOCOCCAL CONJUGATE-20 04/19/2022   Pfizer Covid-19 Vaccine Bivalent Booster 59yrs & up 05/17/2021   Pneumococcal Polysaccharide-23 02/19/2020, 11/19/2020   Tdap 06/29/2006, 11/22/2016   Typhoid Live 11/22/2016   Yellow Fever 11/22/2016   Zoster Recombinant(Shingrix) 03/24/2020, 05/24/2020   Zoster, Live 06/23/2015    TDAP status: Due, Education has been provided regarding the importance of this vaccine. Advised may receive this vaccine at local pharmacy or Health Dept. Aware to provide a copy of the vaccination record if obtained from local pharmacy or Health Dept. Verbalized acceptance and understanding.  Flu Vaccine status: Up to date  Pneumococcal vaccine status: Up to date  Covid-19 vaccine status: Declined, Education has been provided regarding the importance of this vaccine but patient still declined. Advised may receive this vaccine at local pharmacy or Health Dept.or vaccine clinic. Aware to provide a copy of the vaccination record if obtained from local pharmacy or Health Dept. Verbalized acceptance and understanding.  Qualifies for Shingles Vaccine? Yes   Zostavax completed Yes   Shingrix Completed?: Yes  Screening Tests Health Maintenance  Topic Date Due   COVID-19 Vaccine (7 - 2023-24 season) 06/06/2023    Medicare Annual Wellness (AWV)  07/06/2024   Colonoscopy  08/25/2025   DTaP/Tdap/Td (3 - Td or Tdap) 11/23/2026   Pneumonia Vaccine 29+ Years old  Completed   INFLUENZA VACCINE  Completed   Hepatitis C Screening  Completed   Zoster Vaccines- Shingrix  Completed   HPV VACCINES  Aged Out    Health Maintenance  Health Maintenance Due  Topic Date Due   COVID-19 Vaccine (7 - 2023-24 season) 06/06/2023    Colorectal cancer screening: Type of screening: Colonoscopy. Completed 08/25/20. Repeat every 5 years    Additional Screening:  Hepatitis C Screening: does qualify; Completed 12/06/16  Vision Screening: Recommended annual ophthalmology exams for early detection of glaucoma and other disorders of the eye. Is the patient up to date with their annual eye exam?  Yes  Who is the provider or what is the name of the  office in which the patient attends annual eye exams? [facility] If pt is not established with a provider, would they like to be referred to a provider to establish care? No .   Dental Screening: Recommended annual dental exams for proper oral hygiene    Community Resource Referral / Chronic Care Management:  CRR required this visit?  No   CCM required this visit?  No     Plan:     I have personally reviewed and noted the following in the patient's chart:   Medical and social history Use of alcohol, tobacco or illicit drugs  Current medications and supplements including opioid prescriptions. Patient is not currently taking opioid prescriptions. Functional ability and status Nutritional status Physical activity Advanced directives List of other physicians Hospitalizations, surgeries, and ER visits in previous 12 months Vitals Screenings to include cognitive, depression, and falls Referrals and appointments  In addition, I have reviewed and discussed with patient certain preventive protocols, quality metrics, and best practice recommendations. A written  personalized care plan for preventive services as well as general preventive health recommendations were provided to patient.     Tillie Rung, LPN   [id number]   After Visit Summary: (MyChart) Due to this being a telephonic visit, the after visit summary with patients personalized plan was offered to patient via MyChart   Nurse Notes: None

## 2023-07-07 NOTE — Patient Instructions (Addendum)
[name] , Thank you for taking time to come for your Medicare Wellness Visit. I appreciate your ongoing commitment to your health goals. Please review the following plan we discussed and let me know if I can assist you in the future.   Referrals/Orders/Follow-Ups/Clinician Recommendations:   This is a list of the screening recommended for you and due dates:  Health Maintenance  Topic Date Due   COVID-19 Vaccine (7 - 2023-24 season) 06/06/2023   Medicare Annual Wellness Visit  07/06/2024   Colon Cancer Screening  08/25/2025   DTaP/Tdap/Td vaccine (3 - Td or Tdap) 11/23/2026   Pneumonia Vaccine  Completed   Flu Shot  Completed   Hepatitis C Screening  Completed   Zoster (Shingles) Vaccine  Completed   HPV Vaccine  Aged Out    Advanced directives: (Copy Requested) Please bring a copy of your health care power of attorney and living will to the office to be added to your chart at your convenience.  Next Medicare Annual Wellness Visit scheduled for next year: Yes

## 2023-07-12 DIAGNOSIS — J3081 Allergic rhinitis due to animal (cat) (dog) hair and dander: Secondary | ICD-10-CM | POA: Diagnosis not present

## 2023-07-12 DIAGNOSIS — J3089 Other allergic rhinitis: Secondary | ICD-10-CM | POA: Diagnosis not present

## 2023-07-12 DIAGNOSIS — J301 Allergic rhinitis due to pollen: Secondary | ICD-10-CM | POA: Diagnosis not present

## 2023-07-20 DIAGNOSIS — J3081 Allergic rhinitis due to animal (cat) (dog) hair and dander: Secondary | ICD-10-CM | POA: Diagnosis not present

## 2023-07-20 DIAGNOSIS — J3089 Other allergic rhinitis: Secondary | ICD-10-CM | POA: Diagnosis not present

## 2023-07-20 DIAGNOSIS — J301 Allergic rhinitis due to pollen: Secondary | ICD-10-CM | POA: Diagnosis not present

## 2023-07-21 ENCOUNTER — Ambulatory Visit: Payer: PPO | Admitting: Neurology

## 2023-07-21 DIAGNOSIS — R202 Paresthesia of skin: Secondary | ICD-10-CM | POA: Diagnosis not present

## 2023-07-21 DIAGNOSIS — M5416 Radiculopathy, lumbar region: Secondary | ICD-10-CM

## 2023-07-21 NOTE — Procedures (Signed)
[facility]  [address] [place], [address]  [place], [place] [address] Tel: [phone] Fax: [phone] Test Date:  07/21/2023  Patient: Stanley Bennett DOB: [birth year] Physician: Nita Sickle, DO  Sex: Male Height: 5\' 10"  Ref Phys: Antoine Primas, DO  ID#: [id number]   Technician:    History: This is a 67 year old man referred for evaluation of bilateral leg pain.  NCV & EMG Findings: Extensive electrodiagnostic testing of the right lower extremity and additional studies of the left shows:  Bilateral sural and superficial peroneal sensory responses are within normal limits. Bilateral peroneal and tibial motor responses are within normal limits. Bilateral tibial H reflex studies are within normal limits.   Chronic motor axonal loss changes are seen affecting the L2-3 myotome bilaterally, without accompanying active denervation.  Impression: Chronic L2-3 radiculopathy affecting bilateral lower extremities, mild. There is no evidence of a large fiber sensorimotor polyneuropathy affecting the lower extremities.   ___________________________ Nita Sickle, DO    Nerve Conduction Studies   Stim Site NR Peak (ms) Norm Peak (ms) O-P Amp (V) Norm O-P Amp  Left Sup Peroneal Anti Sensory (Ant Lat Mall)  32 C  12 cm    2.4 <4.6 14.0 >3  Right Sup Peroneal Anti Sensory (Ant Lat Mall)  32 C  12 cm    2.6 <4.6 12.3 >3  Left Sural Anti Sensory (Lat Mall)  32 C  Calf    3.2 <4.6 9.0 >3  Right Sural Anti Sensory (Lat Mall)  32 C  Calf    3.0 <4.6 10.2 >3     Stim Site NR Onset (ms) Norm Onset (ms) O-P Amp (mV) Norm O-P Amp Site1 Site2 Delta-0 (ms) Dist (cm) Vel (m/s) Norm Vel (m/s)  Left Peroneal Motor (Ext Dig Brev)  32 C  Ankle    4.7 <6.0 2.9 >2.5 B Fib Ankle 6.6 35.0 53 >40  B Fib    11.3  2.9  Poplt B Fib 1.3 8.0 62 >40  Poplt    12.6  2.8         Right Peroneal Motor (Ext Dig Brev)  32 C  Ankle    3.7 <6.0 2.9 >2.5 B Fib Ankle 6.1 36.0 59 >40  B Fib    9.8   2.9  Poplt B Fib 1.3 8.0 62 >40  Poplt    11.1  2.9         Left Tibial Motor (Abd Hall Brev)  32 C  Ankle    4.0 <6.0 13.2 >4 Knee Ankle 7.8 42.0 54 >40  Knee    11.8  10.1         Right Tibial Motor (Abd Hall Brev)  32 C  Ankle    4.2 <6.0 12.3 >4 Knee Ankle 7.5 42.0 56 >40  Knee    11.7  7.2          Electromyography   Side Muscle Ins.Act Fibs Fasc Recrt Amp Dur Poly Activation Comment  Right AntTibialis Nml Nml Nml Nml Nml Nml Nml Nml N/A  Right Gastroc Nml Nml Nml Nml Nml Nml Nml Nml N/A  Right Flex Dig Long Nml Nml Nml Nml Nml Nml Nml Nml N/A  Right RectFemoris Nml Nml Nml *1- *1+ *1+ *1+ Nml N/A  Right GluteusMed Nml Nml Nml Nml Nml Nml Nml Nml N/A  Right Iliopsoas Nml Nml Nml *1- *1+ *1+ *1+ Nml N/A  Right AdductorLong Nml Nml Nml *1- *1+ *1+ *1+ Nml N/A  Left AntTibialis  Nml Nml Nml Nml Nml Nml Nml Nml N/A  Left Gastroc Nml Nml Nml Nml Nml Nml Nml Nml N/A  Left Flex Dig Long Nml Nml Nml Nml Nml Nml Nml Nml N/A  Left RectFemoris Nml Nml Nml *1- *1+ *1+ *1+ Nml N/A  Left GluteusMed Nml Nml Nml Nml Nml Nml Nml Nml N/A  Left AdductorLong Nml Nml Nml *1- *1+ *1+ *1+ Nml N/A      Waveforms:

## 2023-07-27 ENCOUNTER — Encounter: Payer: Self-pay | Admitting: Family Medicine

## 2023-08-02 DIAGNOSIS — J301 Allergic rhinitis due to pollen: Secondary | ICD-10-CM | POA: Diagnosis not present

## 2023-08-02 DIAGNOSIS — J3089 Other allergic rhinitis: Secondary | ICD-10-CM | POA: Diagnosis not present

## 2023-08-02 DIAGNOSIS — J3081 Allergic rhinitis due to animal (cat) (dog) hair and dander: Secondary | ICD-10-CM | POA: Diagnosis not present

## 2023-08-05 ENCOUNTER — Other Ambulatory Visit: Payer: Self-pay | Admitting: Internal Medicine

## 2023-08-05 DIAGNOSIS — N401 Enlarged prostate with lower urinary tract symptoms: Secondary | ICD-10-CM

## 2023-08-14 DIAGNOSIS — J3081 Allergic rhinitis due to animal (cat) (dog) hair and dander: Secondary | ICD-10-CM | POA: Diagnosis not present

## 2023-08-14 DIAGNOSIS — J3089 Other allergic rhinitis: Secondary | ICD-10-CM | POA: Diagnosis not present

## 2023-08-14 DIAGNOSIS — J301 Allergic rhinitis due to pollen: Secondary | ICD-10-CM | POA: Diagnosis not present

## 2023-08-15 DIAGNOSIS — J3081 Allergic rhinitis due to animal (cat) (dog) hair and dander: Secondary | ICD-10-CM | POA: Diagnosis not present

## 2023-08-15 DIAGNOSIS — J3089 Other allergic rhinitis: Secondary | ICD-10-CM | POA: Diagnosis not present

## 2023-08-15 DIAGNOSIS — J301 Allergic rhinitis due to pollen: Secondary | ICD-10-CM | POA: Diagnosis not present

## 2023-08-23 DIAGNOSIS — J301 Allergic rhinitis due to pollen: Secondary | ICD-10-CM | POA: Diagnosis not present

## 2023-08-23 DIAGNOSIS — J3081 Allergic rhinitis due to animal (cat) (dog) hair and dander: Secondary | ICD-10-CM | POA: Diagnosis not present

## 2023-08-23 DIAGNOSIS — J3089 Other allergic rhinitis: Secondary | ICD-10-CM | POA: Diagnosis not present

## 2023-08-28 DIAGNOSIS — J3081 Allergic rhinitis due to animal (cat) (dog) hair and dander: Secondary | ICD-10-CM | POA: Diagnosis not present

## 2023-08-28 DIAGNOSIS — J301 Allergic rhinitis due to pollen: Secondary | ICD-10-CM | POA: Diagnosis not present

## 2023-08-28 DIAGNOSIS — J3089 Other allergic rhinitis: Secondary | ICD-10-CM | POA: Diagnosis not present

## 2023-09-06 DIAGNOSIS — D225 Melanocytic nevi of trunk: Secondary | ICD-10-CM | POA: Diagnosis not present

## 2023-09-06 DIAGNOSIS — D1801 Hemangioma of skin and subcutaneous tissue: Secondary | ICD-10-CM | POA: Diagnosis not present

## 2023-09-06 DIAGNOSIS — L814 Other melanin hyperpigmentation: Secondary | ICD-10-CM | POA: Diagnosis not present

## 2023-09-06 DIAGNOSIS — D2239 Melanocytic nevi of other parts of face: Secondary | ICD-10-CM | POA: Diagnosis not present

## 2023-09-06 DIAGNOSIS — L570 Actinic keratosis: Secondary | ICD-10-CM | POA: Diagnosis not present

## 2023-09-06 DIAGNOSIS — L821 Other seborrheic keratosis: Secondary | ICD-10-CM | POA: Diagnosis not present

## 2023-09-06 DIAGNOSIS — Z85828 Personal history of other malignant neoplasm of skin: Secondary | ICD-10-CM | POA: Diagnosis not present

## 2023-09-11 DIAGNOSIS — J301 Allergic rhinitis due to pollen: Secondary | ICD-10-CM | POA: Diagnosis not present

## 2023-09-11 DIAGNOSIS — J3089 Other allergic rhinitis: Secondary | ICD-10-CM | POA: Diagnosis not present

## 2023-09-11 DIAGNOSIS — J3081 Allergic rhinitis due to animal (cat) (dog) hair and dander: Secondary | ICD-10-CM | POA: Diagnosis not present

## 2023-09-18 DIAGNOSIS — J3089 Other allergic rhinitis: Secondary | ICD-10-CM | POA: Diagnosis not present

## 2023-09-18 DIAGNOSIS — J301 Allergic rhinitis due to pollen: Secondary | ICD-10-CM | POA: Diagnosis not present

## 2023-09-18 DIAGNOSIS — J3081 Allergic rhinitis due to animal (cat) (dog) hair and dander: Secondary | ICD-10-CM | POA: Diagnosis not present

## 2023-09-25 ENCOUNTER — Other Ambulatory Visit: Payer: Self-pay

## 2023-09-25 ENCOUNTER — Encounter: Payer: Self-pay | Admitting: Family Medicine

## 2023-09-25 DIAGNOSIS — M5416 Radiculopathy, lumbar region: Secondary | ICD-10-CM

## 2023-09-27 DIAGNOSIS — J3089 Other allergic rhinitis: Secondary | ICD-10-CM | POA: Diagnosis not present

## 2023-09-27 DIAGNOSIS — J301 Allergic rhinitis due to pollen: Secondary | ICD-10-CM | POA: Diagnosis not present

## 2023-09-27 DIAGNOSIS — J3081 Allergic rhinitis due to animal (cat) (dog) hair and dander: Secondary | ICD-10-CM | POA: Diagnosis not present

## 2023-10-02 NOTE — Discharge Instructions (Signed)

## 2023-10-03 ENCOUNTER — Other Ambulatory Visit: Payer: PPO

## 2023-10-03 ENCOUNTER — Ambulatory Visit
Admission: RE | Admit: 2023-10-03 | Discharge: 2023-10-03 | Disposition: A | Discharge status: Home / Self Care | Payer: PPO | Source: Ambulatory Visit | Attending: Family Medicine | Admitting: Family Medicine

## 2023-10-03 DIAGNOSIS — M4727 Other spondylosis with radiculopathy, lumbosacral region: Secondary | ICD-10-CM | POA: Diagnosis not present

## 2023-10-03 DIAGNOSIS — M5416 Radiculopathy, lumbar region: Secondary | ICD-10-CM

## 2023-10-03 MED ORDER — IOPAMIDOL (ISOVUE-M 200) INJECTION 41%
1.0000 mL | Freq: Once | INTRAMUSCULAR | Status: AC
  Administered 2023-10-03: 1 mL via EPIDURAL

## 2023-10-03 MED ORDER — METHYLPREDNISOLONE ACETATE 40 MG/ML INJ SUSP (RADIOLOG
80.0000 mg | Freq: Once | INTRAMUSCULAR | Status: AC
  Administered 2023-10-03: 80 mg via EPIDURAL

## 2023-10-04 DIAGNOSIS — J301 Allergic rhinitis due to pollen: Secondary | ICD-10-CM | POA: Diagnosis not present

## 2023-10-04 DIAGNOSIS — J3089 Other allergic rhinitis: Secondary | ICD-10-CM | POA: Diagnosis not present

## 2023-10-04 DIAGNOSIS — J3081 Allergic rhinitis due to animal (cat) (dog) hair and dander: Secondary | ICD-10-CM | POA: Diagnosis not present

## 2023-10-10 ENCOUNTER — Other Ambulatory Visit (HOSPITAL_BASED_OUTPATIENT_CLINIC_OR_DEPARTMENT_OTHER): Payer: Self-pay | Admitting: Cardiology

## 2023-10-10 ENCOUNTER — Other Ambulatory Visit: Payer: Self-pay | Admitting: Internal Medicine

## 2023-10-10 DIAGNOSIS — E7800 Pure hypercholesterolemia, unspecified: Secondary | ICD-10-CM

## 2023-10-11 DIAGNOSIS — J3089 Other allergic rhinitis: Secondary | ICD-10-CM | POA: Diagnosis not present

## 2023-10-11 DIAGNOSIS — J301 Allergic rhinitis due to pollen: Secondary | ICD-10-CM | POA: Diagnosis not present

## 2023-10-11 DIAGNOSIS — J3081 Allergic rhinitis due to animal (cat) (dog) hair and dander: Secondary | ICD-10-CM | POA: Diagnosis not present

## 2023-10-16 DIAGNOSIS — J3081 Allergic rhinitis due to animal (cat) (dog) hair and dander: Secondary | ICD-10-CM | POA: Diagnosis not present

## 2023-10-16 DIAGNOSIS — J301 Allergic rhinitis due to pollen: Secondary | ICD-10-CM | POA: Diagnosis not present

## 2023-10-16 DIAGNOSIS — J3089 Other allergic rhinitis: Secondary | ICD-10-CM | POA: Diagnosis not present

## 2023-10-25 DIAGNOSIS — J3089 Other allergic rhinitis: Secondary | ICD-10-CM | POA: Diagnosis not present

## 2023-10-25 DIAGNOSIS — J301 Allergic rhinitis due to pollen: Secondary | ICD-10-CM | POA: Diagnosis not present

## 2023-10-25 DIAGNOSIS — J3081 Allergic rhinitis due to animal (cat) (dog) hair and dander: Secondary | ICD-10-CM | POA: Diagnosis not present

## 2023-11-01 DIAGNOSIS — J3089 Other allergic rhinitis: Secondary | ICD-10-CM | POA: Diagnosis not present

## 2023-11-01 DIAGNOSIS — J3081 Allergic rhinitis due to animal (cat) (dog) hair and dander: Secondary | ICD-10-CM | POA: Diagnosis not present

## 2023-11-01 DIAGNOSIS — J301 Allergic rhinitis due to pollen: Secondary | ICD-10-CM | POA: Diagnosis not present

## 2023-11-03 ENCOUNTER — Other Ambulatory Visit: Payer: Self-pay | Admitting: Internal Medicine

## 2023-11-03 DIAGNOSIS — N401 Enlarged prostate with lower urinary tract symptoms: Secondary | ICD-10-CM

## 2023-11-09 DIAGNOSIS — J301 Allergic rhinitis due to pollen: Secondary | ICD-10-CM | POA: Diagnosis not present

## 2023-11-09 DIAGNOSIS — J3081 Allergic rhinitis due to animal (cat) (dog) hair and dander: Secondary | ICD-10-CM | POA: Diagnosis not present

## 2023-11-09 DIAGNOSIS — J3089 Other allergic rhinitis: Secondary | ICD-10-CM | POA: Diagnosis not present

## 2023-11-09 NOTE — Progress Notes (Signed)
 [name] [address] [place] [address] Phone: [phone] Subjective:   Stanley Bennett, am serving as a scribe for Dr. Antoine Primas.  I'm seeing this patient by the request  of:  Philip Aspen, Limmie Patricia, MD  CC: lumbar and right elbow pain f/u   [id number]  06/13/2023 Completely resolved at this time.  Patient is now 5 months out from the PRP.  Very hopeful that patient will continue to make improvement.  Follow-up with me again in 6 to 8 weeks otherwise   Has had difficulty with carotid calcifications previously.  Will see if there is any other difficulties of the lower extremities with a nonobstructive atherosclerosis of the coronary arteries also in his history.  Concerned that this could be potentially contributing.  Will also get nerve conduction studies of the lower extremities to see if anything else is potentially contributing.  Continuing to have cramping noted as well.  Follow-up again in 6 to 8 weeks      Update 11/14/2023 Stanley Bennett is a 68 y.o. male coming in with complaint of lumbar spine and R elbow pain. Epidural 10/03/2023. Patient states that epidural was only effective for 2 weeks. Pain use to radiate down the legs but is now localized to B glutes.   Pain in elbow is still present but manageable. Worse when lifting something heavy.        Past Medical History:  Diagnosis Date   Allergy    seasonal allergies   Arthritis    generalized   Basal cell carcinoma    scalp   GERD (gastroesophageal reflux disease)    on meds   Gout    on gout   Hyperlipidemia    on meds   Past Surgical History:  Procedure Laterality Date   APPENDECTOMY     BASAL CELL CARCINOMA EXCISION     CERVICAL DISC SURGERY     COLONOSCOPY  05/2017   MAC-suprep(exc)-polyps   TONSILLECTOMY     UPPER GASTROINTESTINAL ENDOSCOPY  05/2017   normal   VASECTOMY     WISDOM TOOTH EXTRACTION     Social History   Socioeconomic  History   Marital status: Married    Spouse name: Larita Fife   Number of children: 3   Years of education: Not on file   Highest education level: Bachelor's degree (e.g., BA, AB, BS)  Occupational History   Occupation: Brewing technologist    Comment: retired  Tobacco Use   Smoking status: Never   Smokeless tobacco: Never  Vaping Use   Vaping status: Never Used  Substance and Sexual Activity   Alcohol use: Yes    Alcohol/week: 5.0 standard drinks of alcohol    Types: 5 Standard drinks or equivalent per week    Comment: 5   Drug use: No   Sexual activity: Yes    Partners: Female  Other Topics Concern   Not on file  Social History Narrative   Work or School: Advertising account planner - Administrator, 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      Home Situation: lives with wife      Spiritual Beliefs: Catholic      Lifestyle: regular exercise and healthy diet   Social Drivers of Corporate investment banker Strain: Low Risk  (07/07/2023)   Overall Financial Resource Strain (CARDIA)    Difficulty of Paying Living Expenses: Not hard at all  Food Insecurity: No Food Insecurity (07/07/2023)   Hunger Vital Sign  Worried About Programme researcher, broadcasting/film/video in the Last Year: Never true    Ran Out of Food in the Last Year: Never true  Transportation Needs: No Transportation Needs (07/07/2023)   PRAPARE - Administrator, Civil Service (Medical): No    Lack of Transportation (Non-Medical): No  Physical Activity: Sufficiently Active (07/07/2023)   Exercise Vital Sign    Days of Exercise per Week: 6 days    Minutes of Exercise per Session: 60 min  Stress: No Stress Concern Present (07/07/2023)   [name]    Feeling of Stress : Not at all  Social Connections: Socially Integrated (07/07/2023)   Social Connection and Isolation Panel [NHANES]    Frequency of Communication with Friends and Family: More than three times a week    Frequency of  Social Gatherings with Friends and Family: More than three times a week    Attends Religious Services: More than 4 times per year    Active Member of [employer] or Organizations: Yes    Attends Engineer, structural: More than 4 times per year    Marital Status: Married   Allergies  Allergen Reactions   Teriflunomide Other (See Comments)   Tamiflu [Oseltamivir] Rash    Mild rash 2018   Family History  Problem Relation Age of Onset   Arthritis Mother        osteo   Arthritis Father        osteo   Heart disease Father        (pneumonia cause of death 63 yrs)   Prostate cancer Father    Diabetes Sister    Ovarian cancer Sister    Other Brother        celiac sprue   Prostate cancer Brother 42   Kidney failure Cousin        pat cousin   Kidney failure Cousin        pat cousin   Colon cancer Neg Hx    Esophageal cancer Neg Hx    Rectal cancer Neg Hx    Colon polyps Neg Hx    Stomach cancer Neg Hx      Current Outpatient Medications (Cardiovascular):    EPINEPHrine 0.3 mg/0.3 mL IJ SOAJ injection, SMARTSIG:Injection As Directed   rosuvastatin (CRESTOR) 20 MG tablet, TAKE 1 TABLET BY MOUTH DAILY   tadalafil (CIALIS) 5 MG tablet, TAKE ONE TABLET BY MOUTH ONCE A DAY  Current Outpatient Medications (Respiratory):    fluticasone (FLONASE) 50 MCG/ACT nasal spray, Place 1 spray into both nostrils 2 (two) times daily.    loratadine (CLARITIN) 10 MG tablet, Take 10 mg by mouth daily.  Current Outpatient Medications (Analgesics):    allopurinol (ZYLOPRIM) 300 MG tablet, TAKE 1 TABLET BY MOUTH DAILY AS DIRECTED   aspirin EC 81 MG tablet, Take 1 tablet (81 mg total) by mouth daily.   Current Outpatient Medications (Other):    pantoprazole (PROTONIX) 20 MG tablet, TAKE 1 TABLET BY MOUTH DAILY BEFORE BREAKFAST   PRESCRIPTION MEDICATION, Allergy shots every other week   Reviewed prior external information including notes and imaging from  primary care provider As well as notes  that were available from care everywhere and other healthcare systems.  Past medical history, social, surgical and family history all reviewed in electronic medical record.  No pertanent information unless stated regarding to the chief complaint.   Review of Systems:  No headache, visual changes, nausea, vomiting, diarrhea,  constipation, dizziness, abdominal pain, skin rash, fevers, chills, night sweats, weight loss, swollen lymph nodes, body aches, joint swelling, chest pain, shortness of breath, mood changes. POSITIVE muscle aches  Objective  Blood pressure 122/88, pulse (!) 58, height 5\' 10"  (1.778 m), weight 169 lb (76.7 kg), SpO2 98%.   General: No apparent distress alert and oriented x3 mood and affect normal, dressed appropriately.  HEENT: Pupils equal, extraocular movements intact  Respiratory: Patient's speak in full sentences and does not appear short of breath  Cardiovascular: No lower extremity edema, non tender, no erythema  Back exam does have some tenderness to palpation noted.  Patient does have some severe tenderness over the sacroiliac joint bilaterally.  Patient does have tightness with FABER test bilaterally.  After verbal consent patient was prepped with alcohol swab and with a 21-gauge 2 inch needle injected into the right sacroiliac joint.  Total of 1 cc of 0.5% Marcaine and 1 cc of Kenalog 40 mg/mL used.  Minimal blood loss.  Band-Aid placed.  Postinjection instructions given  After verbal consent patient was prepped with alcohol swab and with a 21-gauge 2 inch needle injected into the left sacroiliac joint.  Total of 1 cc of 0.5% Marcaine and 1 cc of Kenalog 40 mg/mL used.  Minimal blood loss.  Band-Aid placed.  Postinjection instructions given.    Impression and Recommendations:    The above documentation has been reviewed and is accurate and complete Judi Saa, DO

## 2023-11-14 ENCOUNTER — Other Ambulatory Visit: Payer: Self-pay

## 2023-11-14 ENCOUNTER — Encounter: Payer: Self-pay | Admitting: Family Medicine

## 2023-11-14 ENCOUNTER — Ambulatory Visit: Payer: PPO | Admitting: Family Medicine

## 2023-11-14 VITALS — BP 122/88 | HR 58 | Ht 70.0 in | Wt 169.0 lb

## 2023-11-14 DIAGNOSIS — M2550 Pain in unspecified joint: Secondary | ICD-10-CM

## 2023-11-14 DIAGNOSIS — M25521 Pain in right elbow: Secondary | ICD-10-CM

## 2023-11-14 DIAGNOSIS — M533 Sacrococcygeal disorders, not elsewhere classified: Secondary | ICD-10-CM | POA: Diagnosis not present

## 2023-11-14 DIAGNOSIS — D509 Iron deficiency anemia, unspecified: Secondary | ICD-10-CM | POA: Insufficient documentation

## 2023-11-14 DIAGNOSIS — M5416 Radiculopathy, lumbar region: Secondary | ICD-10-CM

## 2023-11-14 LAB — COMPREHENSIVE METABOLIC PANEL WITH GFR
ALT: 17 U/L (ref 0–53)
AST: 24 U/L (ref 0–37)
Albumin: 4.2 g/dL (ref 3.5–5.2)
Alkaline Phosphatase: 61 U/L (ref 39–117)
BUN: 13 mg/dL (ref 6–23)
CO2: 27 meq/L (ref 19–32)
Calcium: 9.3 mg/dL (ref 8.4–10.5)
Chloride: 105 meq/L (ref 96–112)
Creatinine, Ser: 0.89 mg/dL (ref 0.40–1.50)
GFR: 87.46 mL/min (ref >60.00)
Glucose, Bld: 77 mg/dL (ref 70–99)
Potassium: 5 meq/L (ref 3.5–5.1)
Sodium: 140 meq/L (ref 135–145)
Total Bilirubin: 0.8 mg/dL (ref 0.2–1.2)
Total Protein: 7.3 g/dL (ref 6.0–8.3)

## 2023-11-14 LAB — VITAMIN B12: Vitamin B-12: 267 pg/mL (ref 211–911)

## 2023-11-14 LAB — CBC WITH DIFFERENTIAL/PLATELET
Basophils Absolute: 0 10*3/uL (ref 0.0–0.1)
Basophils Relative: 0.8 % (ref 0.0–3.0)
Eosinophils Absolute: 0.1 10*3/uL (ref 0.0–0.7)
Eosinophils Relative: 1.8 % (ref 0.0–5.0)
HCT: 46.6 % (ref 39.0–52.0)
Hemoglobin: 15.3 g/dL (ref 13.0–17.0)
Lymphocytes Relative: 20.4 % (ref 12.0–46.0)
Lymphs Abs: 1.3 10*3/uL (ref 0.7–4.0)
MCHC: 32.9 g/dL (ref 30.0–36.0)
MCV: 94.8 fl (ref 78.0–100.0)
Monocytes Absolute: 0.5 10*3/uL (ref 0.1–1.0)
Monocytes Relative: 8.4 % (ref 3.0–12.0)
Neutro Abs: 4.3 10*3/uL (ref 1.4–7.7)
Neutrophils Relative %: 68.6 % (ref 43.0–77.0)
Platelets: 254 10*3/uL (ref 150.0–400.0)
RBC: 4.91 Mil/uL (ref 4.22–5.81)
RDW: 14.6 % (ref 11.5–15.5)
WBC: 6.3 10*3/uL (ref 4.0–10.5)

## 2023-11-14 LAB — FERRITIN: Ferritin: 40.7 ng/mL (ref 22.0–322.0)

## 2023-11-14 LAB — IBC PANEL
Iron: 104 ug/dL (ref 42–165)
Saturation Ratios: 35.4 % (ref 20.0–50.0)
TIBC: 294 ug/dL (ref 250.0–450.0)
Transferrin: 210 mg/dL — ABNORMAL LOW (ref 212.0–360.0)

## 2023-11-14 LAB — TSH: TSH: 4.4 u[IU]/mL (ref 0.35–5.50)

## 2023-11-14 LAB — VITAMIN D 25 HYDROXY (VIT D DEFICIENCY, FRACTURES): VITD: 28.39 ng/mL — ABNORMAL LOW (ref 30.00–100.00)

## 2023-11-14 LAB — URIC ACID: Uric Acid, Serum: 4.7 mg/dL (ref 4.0–7.8)

## 2023-11-14 NOTE — Patient Instructions (Addendum)
 SI joint injections Epidural [phone] Labs today See me again 6 weeks after epidural

## 2023-11-14 NOTE — Assessment & Plan Note (Signed)
 Recheck iron at this time with patient having fatigue and more muscle aches and pains

## 2023-11-14 NOTE — Assessment & Plan Note (Signed)
 Attempted injection, discussed icing regimen and home exercises, discussed which activities to do and which ones to avoid.  Patient did make some improvement with the epidural ligament hopeful that.  If needed the second 1 would be very helpful.  Will order a longer case these injections do not make the difference we would anticipate.  Follow-up with me again 6 weeks after the epidural otherwise.  Hold on any new medications at this time.

## 2023-11-15 DIAGNOSIS — J3081 Allergic rhinitis due to animal (cat) (dog) hair and dander: Secondary | ICD-10-CM | POA: Diagnosis not present

## 2023-11-15 DIAGNOSIS — J3089 Other allergic rhinitis: Secondary | ICD-10-CM | POA: Diagnosis not present

## 2023-11-15 DIAGNOSIS — J301 Allergic rhinitis due to pollen: Secondary | ICD-10-CM | POA: Diagnosis not present

## 2023-11-20 ENCOUNTER — Ambulatory Visit (INDEPENDENT_AMBULATORY_CARE_PROVIDER_SITE_OTHER)

## 2023-11-20 DIAGNOSIS — E538 Deficiency of other specified B group vitamins: Secondary | ICD-10-CM | POA: Diagnosis not present

## 2023-11-20 MED ORDER — CYANOCOBALAMIN 1000 MCG/ML IJ SOLN
1000.0000 ug | Freq: Once | INTRAMUSCULAR | Status: AC
Start: 2023-11-20 — End: 2023-11-20
  Administered 2023-11-20: 1000 ug via INTRAMUSCULAR

## 2023-11-20 NOTE — Progress Notes (Signed)
 Patient received B12 (cyanocobalamin 1000 mcg/ml) in left deltoid today. Patient tolerated well.

## 2023-11-22 DIAGNOSIS — J301 Allergic rhinitis due to pollen: Secondary | ICD-10-CM | POA: Diagnosis not present

## 2023-11-22 DIAGNOSIS — J3081 Allergic rhinitis due to animal (cat) (dog) hair and dander: Secondary | ICD-10-CM | POA: Diagnosis not present

## 2023-11-22 DIAGNOSIS — J3089 Other allergic rhinitis: Secondary | ICD-10-CM | POA: Diagnosis not present

## 2023-11-27 ENCOUNTER — Ambulatory Visit (INDEPENDENT_AMBULATORY_CARE_PROVIDER_SITE_OTHER)

## 2023-11-27 DIAGNOSIS — E538 Deficiency of other specified B group vitamins: Secondary | ICD-10-CM

## 2023-11-27 MED ORDER — CYANOCOBALAMIN 1000 MCG/ML IJ SOLN
1000.0000 ug | Freq: Once | INTRAMUSCULAR | Status: AC
  Administered 2023-11-27: 1000 ug via INTRAMUSCULAR

## 2023-11-27 NOTE — Progress Notes (Signed)
 Patient given B12 injection in upper right arm per Dr. Felipe Horton. Patient Tolerated injection well

## 2023-11-29 DIAGNOSIS — J3089 Other allergic rhinitis: Secondary | ICD-10-CM | POA: Diagnosis not present

## 2023-11-29 DIAGNOSIS — J301 Allergic rhinitis due to pollen: Secondary | ICD-10-CM | POA: Diagnosis not present

## 2023-11-29 DIAGNOSIS — J3081 Allergic rhinitis due to animal (cat) (dog) hair and dander: Secondary | ICD-10-CM | POA: Diagnosis not present

## 2023-12-04 ENCOUNTER — Ambulatory Visit (INDEPENDENT_AMBULATORY_CARE_PROVIDER_SITE_OTHER)

## 2023-12-04 ENCOUNTER — Other Ambulatory Visit: Payer: Self-pay | Admitting: Internal Medicine

## 2023-12-04 DIAGNOSIS — E538 Deficiency of other specified B group vitamins: Secondary | ICD-10-CM

## 2023-12-04 DIAGNOSIS — R7989 Other specified abnormal findings of blood chemistry: Secondary | ICD-10-CM

## 2023-12-04 MED ORDER — CYANOCOBALAMIN 1000 MCG/ML IJ SOLN
1000.0000 ug | Freq: Once | INTRAMUSCULAR | Status: AC
Start: 2023-12-04 — End: 2023-12-04
  Administered 2023-12-04: 1000 ug via INTRAMUSCULAR

## 2023-12-04 NOTE — Discharge Instructions (Signed)

## 2023-12-04 NOTE — Progress Notes (Signed)
 Patient received B12 injection in his left deltoid today. Patient tolerated well.

## 2023-12-05 ENCOUNTER — Ambulatory Visit
Admission: RE | Admit: 2023-12-05 | Discharge: 2023-12-05 | Disposition: A | Discharge status: Home / Self Care | Source: Ambulatory Visit | Attending: Family Medicine | Admitting: Family Medicine

## 2023-12-05 ENCOUNTER — Encounter: Payer: Self-pay | Admitting: Family Medicine

## 2023-12-05 DIAGNOSIS — M5416 Radiculopathy, lumbar region: Secondary | ICD-10-CM

## 2023-12-05 DIAGNOSIS — M4727 Other spondylosis with radiculopathy, lumbosacral region: Secondary | ICD-10-CM | POA: Diagnosis not present

## 2023-12-05 MED ORDER — IOPAMIDOL (ISOVUE-M 200) INJECTION 41%
1.0000 mL | Freq: Once | INTRAMUSCULAR | Status: AC
  Administered 2023-12-05: 1 mL via EPIDURAL

## 2023-12-05 MED ORDER — METHYLPREDNISOLONE ACETATE 40 MG/ML INJ SUSP (RADIOLOG
80.0000 mg | Freq: Once | INTRAMUSCULAR | Status: AC
  Administered 2023-12-05: 80 mg via EPIDURAL

## 2023-12-05 NOTE — Telephone Encounter (Signed)
  Still low and would increase supplementation if you are doing any at this time.  Written by Isidro Margo, DO on 11/14/2023  4:37 PM EDT

## 2023-12-06 DIAGNOSIS — J3089 Other allergic rhinitis: Secondary | ICD-10-CM | POA: Diagnosis not present

## 2023-12-06 DIAGNOSIS — J301 Allergic rhinitis due to pollen: Secondary | ICD-10-CM | POA: Diagnosis not present

## 2023-12-06 DIAGNOSIS — J3081 Allergic rhinitis due to animal (cat) (dog) hair and dander: Secondary | ICD-10-CM | POA: Diagnosis not present

## 2023-12-11 ENCOUNTER — Ambulatory Visit (INDEPENDENT_AMBULATORY_CARE_PROVIDER_SITE_OTHER)

## 2023-12-11 DIAGNOSIS — E538 Deficiency of other specified B group vitamins: Secondary | ICD-10-CM

## 2023-12-11 MED ORDER — CYANOCOBALAMIN 1000 MCG/ML IJ SOLN
1000.0000 ug | Freq: Once | INTRAMUSCULAR | Status: AC
  Administered 2023-12-11: 1000 ug via INTRAMUSCULAR

## 2023-12-11 NOTE — Progress Notes (Signed)
 Patient received B12 1000 mcg/ml in left deltoid today. Patient tolerated well.

## 2023-12-14 DIAGNOSIS — J3089 Other allergic rhinitis: Secondary | ICD-10-CM | POA: Diagnosis not present

## 2023-12-14 DIAGNOSIS — J3081 Allergic rhinitis due to animal (cat) (dog) hair and dander: Secondary | ICD-10-CM | POA: Diagnosis not present

## 2023-12-14 DIAGNOSIS — J301 Allergic rhinitis due to pollen: Secondary | ICD-10-CM | POA: Diagnosis not present

## 2023-12-22 DIAGNOSIS — J301 Allergic rhinitis due to pollen: Secondary | ICD-10-CM | POA: Diagnosis not present

## 2023-12-22 DIAGNOSIS — J3089 Other allergic rhinitis: Secondary | ICD-10-CM | POA: Diagnosis not present

## 2023-12-22 DIAGNOSIS — J3081 Allergic rhinitis due to animal (cat) (dog) hair and dander: Secondary | ICD-10-CM | POA: Diagnosis not present

## 2023-12-28 DIAGNOSIS — J3089 Other allergic rhinitis: Secondary | ICD-10-CM | POA: Diagnosis not present

## 2023-12-28 DIAGNOSIS — J301 Allergic rhinitis due to pollen: Secondary | ICD-10-CM | POA: Diagnosis not present

## 2023-12-28 DIAGNOSIS — J3081 Allergic rhinitis due to animal (cat) (dog) hair and dander: Secondary | ICD-10-CM | POA: Diagnosis not present

## 2024-01-03 DIAGNOSIS — J3081 Allergic rhinitis due to animal (cat) (dog) hair and dander: Secondary | ICD-10-CM | POA: Diagnosis not present

## 2024-01-03 DIAGNOSIS — J301 Allergic rhinitis due to pollen: Secondary | ICD-10-CM | POA: Diagnosis not present

## 2024-01-03 DIAGNOSIS — J3089 Other allergic rhinitis: Secondary | ICD-10-CM | POA: Diagnosis not present

## 2024-01-10 ENCOUNTER — Ambulatory Visit (INDEPENDENT_AMBULATORY_CARE_PROVIDER_SITE_OTHER)

## 2024-01-10 DIAGNOSIS — J3089 Other allergic rhinitis: Secondary | ICD-10-CM | POA: Diagnosis not present

## 2024-01-10 DIAGNOSIS — E538 Deficiency of other specified B group vitamins: Secondary | ICD-10-CM | POA: Diagnosis not present

## 2024-01-10 DIAGNOSIS — J301 Allergic rhinitis due to pollen: Secondary | ICD-10-CM | POA: Diagnosis not present

## 2024-01-10 DIAGNOSIS — J3081 Allergic rhinitis due to animal (cat) (dog) hair and dander: Secondary | ICD-10-CM | POA: Diagnosis not present

## 2024-01-10 MED ORDER — CYANOCOBALAMIN 1000 MCG/ML IJ SOLN: 1000.0000 ug | Freq: Once | INTRAMUSCULAR | 0 refills | Status: DC

## 2024-01-10 MED ORDER — CYANOCOBALAMIN 1000 MCG/ML IJ SOLN
1000.0000 ug | Freq: Once | INTRAMUSCULAR | Status: AC
  Administered 2024-01-10: 1000 ug via INTRAMUSCULAR

## 2024-01-10 NOTE — Progress Notes (Signed)
 Pt received Cyanocobalamin  (B12) 1000 mcg/mL, 1 mL, IM, right deltoid, tolerated well.

## 2024-01-11 ENCOUNTER — Encounter (HOSPITAL_BASED_OUTPATIENT_CLINIC_OR_DEPARTMENT_OTHER): Payer: Self-pay

## 2024-01-15 DIAGNOSIS — J3089 Other allergic rhinitis: Secondary | ICD-10-CM | POA: Diagnosis not present

## 2024-01-15 DIAGNOSIS — J3081 Allergic rhinitis due to animal (cat) (dog) hair and dander: Secondary | ICD-10-CM | POA: Diagnosis not present

## 2024-01-15 DIAGNOSIS — J301 Allergic rhinitis due to pollen: Secondary | ICD-10-CM | POA: Diagnosis not present

## 2024-01-22 NOTE — Telephone Encounter (Signed)
 Please review and advise.

## 2024-01-24 NOTE — Progress Notes (Signed)
 [facility] [address] [place] [address] Phone: [phone] Subjective:   Stanley Bennett, am serving as a scribe for Dr. Ronnell Coins.  I'm seeing this patient by the request  of:  Zilphia Hilt, Charyl Coppersmith, MD  CC: low back and right elbow pain   [id number]  11/14/2023 Recheck iron at this time with patient having fatigue and more muscle aches and pains   Attempted injection, discussed icing regimen and home exercises, discussed which activities to do and which ones to avoid.  Patient did make some improvement with the epidural ligament hopeful that.  If needed the second 1 would be very helpful.  Will order a longer case these injections do not make the difference we would anticipate.  Follow-up with me again 6 weeks after the epidural otherwise.  Hold on any new medications at this time.     Update 01/25/2024 Stanley Bennett is a 68 y.o. male coming in with complaint of lumbar spine and R elbow pain. Epidural 12/05/2023. Patient states that overall her has some pain in the hips and hamstrings with sitting for prolonged periods. Has felt a big improvement.     Patient did get B12 injection May 28.  Past Medical History:  Diagnosis Date   Allergy    seasonal allergies   Arthritis    generalized   Basal cell carcinoma    scalp   GERD (gastroesophageal reflux disease)    on meds   Gout    on gout   Hyperlipidemia    on meds   Past Surgical History:  Procedure Laterality Date   APPENDECTOMY     BASAL CELL CARCINOMA EXCISION     CERVICAL DISC SURGERY     COLONOSCOPY  05/2017   MAC-suprep(exc)-polyps   TONSILLECTOMY     UPPER GASTROINTESTINAL ENDOSCOPY  05/2017   normal   VASECTOMY     WISDOM TOOTH EXTRACTION     Social History   Socioeconomic History   Marital status: Married    Spouse name: Tanis Fan   Number of children: 3   Years of education: Not on file   Highest education level: Bachelor's degree (e.g., BA, AB,  BS)  Occupational History   Occupation: Brewing technologist    Comment: retired  Tobacco Use   Smoking status: Never   Smokeless tobacco: Never  Vaping Use   Vaping status: Never Used  Substance and Sexual Activity   Alcohol use: Yes    Alcohol/week: 5.0 standard drinks of alcohol    Types: 5 Standard drinks or equivalent per week    Comment: 5   Drug use: No   Sexual activity: Yes    Partners: Female  Other Topics Concern   Not on file  Social History Narrative   Work or School: Advertising account planner - Administrator, 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      Home Situation: lives with wife      Spiritual Beliefs: Catholic      Lifestyle: regular exercise and healthy diet   Social Drivers of Corporate investment banker Strain: Low Risk  (07/07/2023)   Overall Financial Resource Strain (CARDIA)    Difficulty of Paying Living Expenses: Not hard at all  Food Insecurity: No Food Insecurity (07/07/2023)   Hunger Vital Sign    Worried About Running Out of Food in the Last Year: Never true    Ran Out of Food in the Last Year: Never true  Transportation Needs: No Transportation Needs (07/07/2023)  PRAPARE - Administrator, Civil Service (Medical): No    Lack of Transportation (Non-Medical): No  Physical Activity: Sufficiently Active (07/07/2023)   Exercise Vital Sign    Days of Exercise per Week: 6 days    Minutes of Exercise per Session: 60 min  Stress: No Stress Concern Present (07/07/2023)   [name]    Feeling of Stress : Not at all  Social Connections: Socially Integrated (07/07/2023)   Social Connection and Isolation Panel    Frequency of Communication with Friends and Family: More than three times a week    Frequency of Social Gatherings with Friends and Family: More than three times a week    Attends Religious Services: More than 4 times per year    Active Member of [employer] or Organizations: Yes    Attends  Engineer, structural: More than 4 times per year    Marital Status: Married   Allergies  Allergen Reactions   Teriflunomide Other (See Comments)   Tamiflu  [Oseltamivir ] Rash    Mild rash 2018   Family History  Problem Relation Age of Onset   Arthritis Mother        osteo   Arthritis Father        osteo   Heart disease Father        (pneumonia cause of death 59 yrs)   Prostate cancer Father    Diabetes Sister    Ovarian cancer Sister    Other Brother        celiac sprue   Prostate cancer Brother 66   Kidney failure Cousin        pat cousin   Kidney failure Cousin        pat cousin   Colon cancer Neg Hx    Esophageal cancer Neg Hx    Rectal cancer Neg Hx    Colon polyps Neg Hx    Stomach cancer Neg Hx      Current Outpatient Medications (Cardiovascular):    EPINEPHrine 0.3 mg/0.3 mL IJ SOAJ injection, SMARTSIG:Injection As Directed   rosuvastatin  (CRESTOR ) 20 MG tablet, TAKE 1 TABLET BY MOUTH DAILY   tadalafil  (CIALIS ) 5 MG tablet, TAKE ONE TABLET BY MOUTH ONCE A DAY  Current Outpatient Medications (Respiratory):    fluticasone (FLONASE) 50 MCG/ACT nasal spray, Place 1 spray into both nostrils 2 (two) times daily.    loratadine (CLARITIN) 10 MG tablet, Take 10 mg by mouth daily.  Current Outpatient Medications (Analgesics):    allopurinol  (ZYLOPRIM ) 300 MG tablet, TAKE 1 TABLET BY MOUTH DAILY AS DIRECTED   aspirin  EC 81 MG tablet, Take 1 tablet (81 mg total) by mouth daily.   Current Outpatient Medications (Other):    pantoprazole  (PROTONIX ) 20 MG tablet, TAKE 1 TABLET BY MOUTH DAILY BEFORE BREAKFAST   PRESCRIPTION MEDICATION, Allergy shots every other week   Vitamin D , Ergocalciferol , (DRISDOL ) 1.25 MG (50000 UNIT) CAPS capsule, TAKE ONE CAPSULE BY MOUTH EVERY SEVEN DAYS   Reviewed prior external information including notes and imaging from  primary care provider As well as notes that were available from care everywhere and other healthcare  systems.  Past medical history, social, surgical and family history all reviewed in electronic medical record.  No pertanent information unless stated regarding to the chief complaint.   Review of Systems:  No headache, visual changes, nausea, vomiting, diarrhea, constipation, dizziness, abdominal pain, skin rash, fevers, chills, night sweats, weight loss, swollen lymph nodes,  body aches, joint swelling, chest pain, shortness of breath, mood changes. POSITIVE muscle aches  Objective  Blood pressure 104/76, pulse (!) 56, height 5' 10 (1.778 m), weight 164 lb (74.4 kg).   General: No apparent distress alert and oriented x3 mood and affect normal, dressed appropriately.  HEENT: Pupils equal, extraocular movements intact  Respiratory: Patient's speak in full sentences and does not appear short of breath  Cardiovascular: No lower extremity edema, non tender, no erythema  Low back exam shows patient has done very well overall.  The patient is sitting much more comfortable than he did previously.  Able to get up from a sitting position with less discomfort than previous exam.    Impression and Recommendations:     The above documentation has been reviewed and is accurate and complete Takeela Peil M Juanita Streight, DO

## 2024-01-25 ENCOUNTER — Ambulatory Visit: Admitting: Family Medicine

## 2024-01-25 ENCOUNTER — Encounter: Payer: Self-pay | Admitting: Family Medicine

## 2024-01-25 VITALS — BP 104/76 | HR 56 | Ht 70.0 in | Wt 164.0 lb

## 2024-01-25 DIAGNOSIS — M5441 Lumbago with sciatica, right side: Secondary | ICD-10-CM | POA: Diagnosis not present

## 2024-01-25 DIAGNOSIS — G8929 Other chronic pain: Secondary | ICD-10-CM

## 2024-01-25 NOTE — Assessment & Plan Note (Signed)
 Patient has made great strides at this time.  Discussed icing regimen and home exercises, discussed which activities to do and tries to avoid.  Increase activity slowly.  Discussed icing regimen.  Increase activity as tolerated.  Patient has done well with the injection and we discussed we can repeat when necessary and otherwise does well can follow-up again as needed.

## 2024-01-29 DIAGNOSIS — J301 Allergic rhinitis due to pollen: Secondary | ICD-10-CM | POA: Diagnosis not present

## 2024-01-29 DIAGNOSIS — J3081 Allergic rhinitis due to animal (cat) (dog) hair and dander: Secondary | ICD-10-CM | POA: Diagnosis not present

## 2024-01-29 DIAGNOSIS — J3089 Other allergic rhinitis: Secondary | ICD-10-CM | POA: Diagnosis not present

## 2024-01-30 ENCOUNTER — Other Ambulatory Visit: Payer: Self-pay | Admitting: Internal Medicine

## 2024-01-30 DIAGNOSIS — N401 Enlarged prostate with lower urinary tract symptoms: Secondary | ICD-10-CM

## 2024-02-05 DIAGNOSIS — J3081 Allergic rhinitis due to animal (cat) (dog) hair and dander: Secondary | ICD-10-CM | POA: Diagnosis not present

## 2024-02-05 DIAGNOSIS — J301 Allergic rhinitis due to pollen: Secondary | ICD-10-CM | POA: Diagnosis not present

## 2024-02-05 DIAGNOSIS — J3089 Other allergic rhinitis: Secondary | ICD-10-CM | POA: Diagnosis not present

## 2024-02-12 ENCOUNTER — Ambulatory Visit (INDEPENDENT_AMBULATORY_CARE_PROVIDER_SITE_OTHER)

## 2024-02-12 DIAGNOSIS — E538 Deficiency of other specified B group vitamins: Secondary | ICD-10-CM | POA: Diagnosis not present

## 2024-02-12 MED ORDER — CYANOCOBALAMIN 1000 MCG/ML IJ SOLN
1000.0000 ug | Freq: Once | INTRAMUSCULAR | Status: AC
  Administered 2024-02-12: 1000 ug via INTRAMUSCULAR

## 2024-02-12 NOTE — Progress Notes (Signed)
 Patient received B12 1000 mcg/ml in left deltoid. Patient tolerated well.

## 2024-02-19 ENCOUNTER — Other Ambulatory Visit: Payer: Self-pay | Admitting: Internal Medicine

## 2024-02-19 DIAGNOSIS — R7989 Other specified abnormal findings of blood chemistry: Secondary | ICD-10-CM

## 2024-02-21 DIAGNOSIS — J3081 Allergic rhinitis due to animal (cat) (dog) hair and dander: Secondary | ICD-10-CM | POA: Diagnosis not present

## 2024-02-21 DIAGNOSIS — J301 Allergic rhinitis due to pollen: Secondary | ICD-10-CM | POA: Diagnosis not present

## 2024-02-21 DIAGNOSIS — J3089 Other allergic rhinitis: Secondary | ICD-10-CM | POA: Diagnosis not present

## 2024-03-02 ENCOUNTER — Other Ambulatory Visit: Payer: Self-pay | Admitting: Internal Medicine

## 2024-03-02 DIAGNOSIS — N401 Enlarged prostate with lower urinary tract symptoms: Secondary | ICD-10-CM

## 2024-03-04 DIAGNOSIS — J301 Allergic rhinitis due to pollen: Secondary | ICD-10-CM | POA: Diagnosis not present

## 2024-03-04 DIAGNOSIS — J3081 Allergic rhinitis due to animal (cat) (dog) hair and dander: Secondary | ICD-10-CM | POA: Diagnosis not present

## 2024-03-04 DIAGNOSIS — J3089 Other allergic rhinitis: Secondary | ICD-10-CM | POA: Diagnosis not present

## 2024-03-13 ENCOUNTER — Ambulatory Visit (INDEPENDENT_AMBULATORY_CARE_PROVIDER_SITE_OTHER)

## 2024-03-13 DIAGNOSIS — J3089 Other allergic rhinitis: Secondary | ICD-10-CM | POA: Diagnosis not present

## 2024-03-13 DIAGNOSIS — E538 Deficiency of other specified B group vitamins: Secondary | ICD-10-CM

## 2024-03-13 DIAGNOSIS — J301 Allergic rhinitis due to pollen: Secondary | ICD-10-CM | POA: Diagnosis not present

## 2024-03-13 DIAGNOSIS — J3081 Allergic rhinitis due to animal (cat) (dog) hair and dander: Secondary | ICD-10-CM | POA: Diagnosis not present

## 2024-03-13 MED ORDER — CYANOCOBALAMIN 1000 MCG/ML IJ SOLN
1000.0000 ug | Freq: Once | INTRAMUSCULAR | Status: AC
Start: 2024-03-13 — End: 2024-03-13
  Administered 2024-03-13: 1000 ug via INTRAMUSCULAR

## 2024-03-13 NOTE — Progress Notes (Signed)
 Patient received B12 1000 mcg/ml in left deltoid. Patient tolerated well.

## 2024-03-20 DIAGNOSIS — J301 Allergic rhinitis due to pollen: Secondary | ICD-10-CM | POA: Diagnosis not present

## 2024-03-20 DIAGNOSIS — J3081 Allergic rhinitis due to animal (cat) (dog) hair and dander: Secondary | ICD-10-CM | POA: Diagnosis not present

## 2024-03-20 DIAGNOSIS — L2089 Other atopic dermatitis: Secondary | ICD-10-CM | POA: Diagnosis not present

## 2024-03-20 DIAGNOSIS — J3089 Other allergic rhinitis: Secondary | ICD-10-CM | POA: Diagnosis not present

## 2024-04-03 DIAGNOSIS — J3081 Allergic rhinitis due to animal (cat) (dog) hair and dander: Secondary | ICD-10-CM | POA: Diagnosis not present

## 2024-04-03 DIAGNOSIS — J3089 Other allergic rhinitis: Secondary | ICD-10-CM | POA: Diagnosis not present

## 2024-04-03 DIAGNOSIS — J301 Allergic rhinitis due to pollen: Secondary | ICD-10-CM | POA: Diagnosis not present

## 2024-04-08 ENCOUNTER — Encounter: Payer: Self-pay | Admitting: Family Medicine

## 2024-04-09 ENCOUNTER — Other Ambulatory Visit: Payer: Self-pay

## 2024-04-09 DIAGNOSIS — M5416 Radiculopathy, lumbar region: Secondary | ICD-10-CM

## 2024-04-10 DIAGNOSIS — J3089 Other allergic rhinitis: Secondary | ICD-10-CM | POA: Diagnosis not present

## 2024-04-10 DIAGNOSIS — J3081 Allergic rhinitis due to animal (cat) (dog) hair and dander: Secondary | ICD-10-CM | POA: Diagnosis not present

## 2024-04-10 DIAGNOSIS — J301 Allergic rhinitis due to pollen: Secondary | ICD-10-CM | POA: Diagnosis not present

## 2024-04-14 ENCOUNTER — Other Ambulatory Visit (HOSPITAL_BASED_OUTPATIENT_CLINIC_OR_DEPARTMENT_OTHER): Payer: Self-pay | Admitting: Cardiology

## 2024-04-14 ENCOUNTER — Other Ambulatory Visit: Payer: Self-pay | Admitting: Internal Medicine

## 2024-04-14 DIAGNOSIS — E7800 Pure hypercholesterolemia, unspecified: Secondary | ICD-10-CM

## 2024-04-16 ENCOUNTER — Ambulatory Visit (INDEPENDENT_AMBULATORY_CARE_PROVIDER_SITE_OTHER)

## 2024-04-16 DIAGNOSIS — E538 Deficiency of other specified B group vitamins: Secondary | ICD-10-CM

## 2024-04-16 MED ORDER — CYANOCOBALAMIN 1000 MCG/ML IJ SOLN
1000.0000 ug | Freq: Once | INTRAMUSCULAR | Status: AC
  Administered 2024-04-16: 1000 ug via INTRAMUSCULAR

## 2024-04-16 NOTE — Progress Notes (Signed)
 Patient received B12 1000 mg/ml in right arm today. Patient tolerated well.

## 2024-04-17 DIAGNOSIS — J301 Allergic rhinitis due to pollen: Secondary | ICD-10-CM | POA: Diagnosis not present

## 2024-04-17 DIAGNOSIS — J3081 Allergic rhinitis due to animal (cat) (dog) hair and dander: Secondary | ICD-10-CM | POA: Diagnosis not present

## 2024-04-17 DIAGNOSIS — J3089 Other allergic rhinitis: Secondary | ICD-10-CM | POA: Diagnosis not present

## 2024-04-18 NOTE — Discharge Instructions (Signed)

## 2024-04-19 ENCOUNTER — Ambulatory Visit
Admission: RE | Admit: 2024-04-19 | Discharge: 2024-04-19 | Disposition: A | Discharge status: Home / Self Care | Source: Ambulatory Visit | Attending: Family Medicine | Admitting: Family Medicine

## 2024-04-19 DIAGNOSIS — M5416 Radiculopathy, lumbar region: Secondary | ICD-10-CM

## 2024-04-19 DIAGNOSIS — M4727 Other spondylosis with radiculopathy, lumbosacral region: Secondary | ICD-10-CM | POA: Diagnosis not present

## 2024-04-19 MED ORDER — METHYLPREDNISOLONE ACETATE 40 MG/ML INJ SUSP (RADIOLOG
80.0000 mg | Freq: Once | INTRAMUSCULAR | Status: AC
  Administered 2024-04-19: 80 mg via EPIDURAL

## 2024-04-19 MED ORDER — IOPAMIDOL (ISOVUE-M 200) INJECTION 41%
1.0000 mL | Freq: Once | INTRAMUSCULAR | Status: AC
  Administered 2024-04-19: 1 mL via EPIDURAL

## 2024-04-24 DIAGNOSIS — J301 Allergic rhinitis due to pollen: Secondary | ICD-10-CM | POA: Diagnosis not present

## 2024-04-24 DIAGNOSIS — J3081 Allergic rhinitis due to animal (cat) (dog) hair and dander: Secondary | ICD-10-CM | POA: Diagnosis not present

## 2024-04-24 DIAGNOSIS — J3089 Other allergic rhinitis: Secondary | ICD-10-CM | POA: Diagnosis not present

## 2024-05-01 DIAGNOSIS — J3089 Other allergic rhinitis: Secondary | ICD-10-CM | POA: Diagnosis not present

## 2024-05-01 DIAGNOSIS — J301 Allergic rhinitis due to pollen: Secondary | ICD-10-CM | POA: Diagnosis not present

## 2024-05-01 DIAGNOSIS — J3081 Allergic rhinitis due to animal (cat) (dog) hair and dander: Secondary | ICD-10-CM | POA: Diagnosis not present

## 2024-05-03 DIAGNOSIS — J3081 Allergic rhinitis due to animal (cat) (dog) hair and dander: Secondary | ICD-10-CM | POA: Diagnosis not present

## 2024-05-03 DIAGNOSIS — J3089 Other allergic rhinitis: Secondary | ICD-10-CM | POA: Diagnosis not present

## 2024-05-03 DIAGNOSIS — J301 Allergic rhinitis due to pollen: Secondary | ICD-10-CM | POA: Diagnosis not present

## 2024-05-13 DIAGNOSIS — J301 Allergic rhinitis due to pollen: Secondary | ICD-10-CM | POA: Diagnosis not present

## 2024-05-13 DIAGNOSIS — J3081 Allergic rhinitis due to animal (cat) (dog) hair and dander: Secondary | ICD-10-CM | POA: Diagnosis not present

## 2024-05-13 DIAGNOSIS — J3089 Other allergic rhinitis: Secondary | ICD-10-CM | POA: Diagnosis not present

## 2024-05-14 ENCOUNTER — Ambulatory Visit (INDEPENDENT_AMBULATORY_CARE_PROVIDER_SITE_OTHER)

## 2024-05-14 DIAGNOSIS — E538 Deficiency of other specified B group vitamins: Secondary | ICD-10-CM

## 2024-05-14 MED ORDER — CYANOCOBALAMIN 1000 MCG/ML IJ SOLN
1000.0000 ug | Freq: Once | INTRAMUSCULAR | Status: AC
  Administered 2024-05-14: 1000 ug via INTRAMUSCULAR

## 2024-05-14 NOTE — Progress Notes (Signed)
Patient received B12 in left arm. Patient tolerated well

## 2024-05-23 DIAGNOSIS — J301 Allergic rhinitis due to pollen: Secondary | ICD-10-CM | POA: Diagnosis not present

## 2024-05-23 DIAGNOSIS — J3089 Other allergic rhinitis: Secondary | ICD-10-CM | POA: Diagnosis not present

## 2024-05-23 DIAGNOSIS — J3081 Allergic rhinitis due to animal (cat) (dog) hair and dander: Secondary | ICD-10-CM | POA: Diagnosis not present

## 2024-05-30 ENCOUNTER — Encounter: Payer: Self-pay | Admitting: Internal Medicine

## 2024-05-30 ENCOUNTER — Other Ambulatory Visit: Payer: Self-pay | Admitting: Internal Medicine

## 2024-05-30 DIAGNOSIS — N401 Enlarged prostate with lower urinary tract symptoms: Secondary | ICD-10-CM

## 2024-05-31 NOTE — Progress Notes (Unsigned)
 [name] [address] [place] [address] Phone: [phone] Subjective:   Stanley Bennett, am serving as a scribe for Dr. Arthea Claudene.  I'm seeing this patient by the request  of:  Theophilus Andrews, Tully GRADE, MD  CC: Low back pain, hip pain  [name]  01/25/2024 Patient has made great strides at this time.  Discussed icing regimen and home exercises, discussed which activities to do and tries to avoid.  Increase activity slowly.  Discussed icing regimen.  Increase activity as tolerated.  Patient has done well with the injection and we discussed we can repeat when necessary and otherwise does well can follow-up again as needed.    Update 06/03/2024 Stanley Bennett is a 68 y.o. male coming in with complaint of lumbar spine pain. Patient states that he is having B hip pain in the anterior aspect that radiates to the medial aspect of his knee. R>L. Not able to sleep due to pain. Epidural on Sept 5th.   Back pain is manageable.     Past Medical History:  Diagnosis Date   Allergy    seasonal allergies   Arthritis    generalized   Basal cell carcinoma    scalp   GERD (gastroesophageal reflux disease)    on meds   Gout    on gout   Hyperlipidemia    on meds   Past Surgical History:  Procedure Laterality Date   APPENDECTOMY     BASAL CELL CARCINOMA EXCISION     CERVICAL DISC SURGERY     COLONOSCOPY  05/2017   MAC-suprep(exc)-polyps   TONSILLECTOMY     UPPER GASTROINTESTINAL ENDOSCOPY  05/2017   normal   VASECTOMY     WISDOM TOOTH EXTRACTION     Social History   Socioeconomic History   Marital status: Married    Spouse name: Macario   Number of children: 3   Years of education: Not on file   Highest education level: Bachelor's degree (e.g., BA, AB, BS)  Occupational History   Occupation: Brewing technologist    Comment: retired  Tobacco Use   Smoking status: Never   Smokeless tobacco: Never  Vaping Use    Vaping status: Never Used  Substance and Sexual Activity   Alcohol use: Yes    Alcohol/week: 5.0 standard drinks of alcohol    Types: 5 Standard drinks or equivalent per week    Comment: 5   Drug use: No   Sexual activity: Yes    Partners: Female  Other Topics Concern   Not on file  Social History Narrative   Work or School: Advertising account planner - Administrator, 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      Home Situation: lives with wife      Spiritual Beliefs: Catholic      Lifestyle: regular exercise and healthy diet   Social Drivers of Corporate investment banker Strain: Low Risk  (07/07/2023)   Overall Financial Resource Strain (CARDIA)    Difficulty of Paying Living Expenses: Not hard at all  Food Insecurity: No Food Insecurity (07/07/2023)   Hunger Vital Sign    Worried About Running Out of Food in the Last Year: Never true    Ran Out of Food in the Last Year: Never true  Transportation Needs: No Transportation Needs (07/07/2023)   PRAPARE - Administrator, Civil Service (Medical): No    Lack of Transportation (Non-Medical): No  Physical Activity: Sufficiently Active (07/07/2023)   Exercise  Vital Sign    Days of Exercise per Week: 6 days    Minutes of Exercise per Session: 60 min  Stress: No Stress Concern Present (07/07/2023)   [name]    Feeling of Stress : Not at all  Social Connections: Socially Integrated (07/07/2023)   Social Connection and Isolation Panel    Frequency of Communication with Friends and Family: More than three times a week    Frequency of Social Gatherings with Friends and Family: More than three times a week    Attends Religious Services: More than 4 times per year    Active Member of [employer] or Organizations: Yes    Attends Engineer, structural: More than 4 times per year    Marital Status: Married   Allergies  Allergen Reactions   Teriflunomide Other (See Comments)   Tamiflu   [Oseltamivir ] Rash    Mild rash 2018   Family History  Problem Relation Age of Onset   Arthritis Mother        osteo   Arthritis Father        osteo   Heart disease Father        (pneumonia cause of death 78 yrs)   Prostate cancer Father    Diabetes Sister    Ovarian cancer Sister    Other Brother        celiac sprue   Prostate cancer Brother 84   Kidney failure Cousin        pat cousin   Kidney failure Cousin        pat cousin   Colon cancer Neg Hx    Esophageal cancer Neg Hx    Rectal cancer Neg Hx    Colon polyps Neg Hx    Stomach cancer Neg Hx      Current Outpatient Medications (Cardiovascular):    EPINEPHrine 0.3 mg/0.3 mL IJ SOAJ injection, SMARTSIG:Injection As Directed   rosuvastatin  (CRESTOR ) 20 MG tablet, TAKE 1 TABLET BY MOUTH DAILY   tadalafil  (CIALIS ) 5 MG tablet, TAKE ONE TABLET BY MOUTH ONCE A DAY  Current Outpatient Medications (Respiratory):    fluticasone (FLONASE) 50 MCG/ACT nasal spray, Place 1 spray into both nostrils 2 (two) times daily.    loratadine (CLARITIN) 10 MG tablet, Take 10 mg by mouth daily.  Current Outpatient Medications (Analgesics):    allopurinol  (ZYLOPRIM ) 300 MG tablet, TAKE 1 TABLET BY MOUTH DAILY AS DIRECTED   aspirin  EC 81 MG tablet, Take 1 tablet (81 mg total) by mouth daily.   Current Outpatient Medications (Other):    pantoprazole  (PROTONIX ) 20 MG tablet, TAKE 1 TABLET BY MOUTH DAILY BEFORE BREAKFAST   PRESCRIPTION MEDICATION, Allergy shots every other week   Vitamin D , Ergocalciferol , (DRISDOL ) 1.25 MG (50000 UNIT) CAPS capsule, TAKE ONE CAPSULE BY MOUTH EVERY SEVEN DAYS   Reviewed prior external information including notes and imaging from  primary care provider As well as notes that were available from care everywhere and other healthcare systems.  Past medical history, social, surgical and family history all reviewed in electronic medical record.  No pertanent information unless stated regarding to the chief  complaint.   Review of Systems:  No headache, visual changes, nausea, vomiting, diarrhea, constipation, dizziness, abdominal pain, skin rash, fevers, chills, night sweats, weight loss, swollen lymph nodes, body aches, joint swelling, chest pain, shortness of breath, mood changes. POSITIVE muscle aches  Objective  Blood pressure 120/74, pulse (!) 55, height 5' 10 (1.778 m),  weight 175 lb (79.4 kg), SpO2 99%.   General: No apparent distress alert and oriented x3 mood and affect normal, dressed appropriately.  HEENT: Pupils equal, extraocular movements intact  Respiratory: Patient's speak in full sentences and does not appear short of breath  Cardiovascular: No lower extremity edema, non tender, no erythema  Right hip does have still some impingement noted.  Some pain though with resisted exercises against the sartorius with external rotation of the leg and hip flexion.  Knee does have a trace effusion noted but consistent with the contralateral side.  Patient is severely tender to palpation over the greater trochanteric area.  Back exam does have some loss of lordosis noted.    After verbal consent patient was prepped with alcohol swab and with a 21-gauge 2 inch needle injected into the right greater trochanteric area with 2 cc of 0.5% Marcaine and 1 cc of Kenalog 40 mg/mL.  No blood loss.  Band-Aid placed.  Postinjection instructions given Impression and Recommendations:    The above documentation has been reviewed and is accurate and complete Jinx Gilden M Phylis Javed, DO

## 2024-06-03 ENCOUNTER — Encounter: Payer: Self-pay | Admitting: Family Medicine

## 2024-06-03 ENCOUNTER — Other Ambulatory Visit: Payer: Self-pay

## 2024-06-03 ENCOUNTER — Ambulatory Visit: Admitting: Family Medicine

## 2024-06-03 VITALS — BP 120/74 | HR 55 | Ht 70.0 in | Wt 175.0 lb

## 2024-06-03 DIAGNOSIS — M7061 Trochanteric bursitis, right hip: Secondary | ICD-10-CM | POA: Insufficient documentation

## 2024-06-03 DIAGNOSIS — M109 Gout, unspecified: Secondary | ICD-10-CM | POA: Diagnosis not present

## 2024-06-03 DIAGNOSIS — M25851 Other specified joint disorders, right hip: Secondary | ICD-10-CM

## 2024-06-03 MED ORDER — TADALAFIL 5 MG PO TABS: 5.0000 mg | ORAL_TABLET | Freq: Every day | ORAL | 0 refills | Status: DC

## 2024-06-03 MED ORDER — TIZANIDINE HCL 2 MG PO TABS: 2.0000 mg | ORAL_TABLET | Freq: Every day | ORAL | 0 refills | Status: DC

## 2024-06-03 NOTE — Assessment & Plan Note (Signed)
 Patient given injection and tolerated the procedure well, discussed icing regimen and home exercises, discussed which activities to do which ones to avoid.  Increase activity slowly.  Discussed icing regimen.  Follow-up again in 6 to 8 weeks

## 2024-06-03 NOTE — Assessment & Plan Note (Signed)
 Continue supplementation  ?

## 2024-06-03 NOTE — Assessment & Plan Note (Signed)
 More difficulty with the sartorius muscle today than truly the hip joint itself.  Hip x-rays have been fairly unremarkable.  Neck step if patient continues to have difficulty would be a CT of the abdomen and pelvis but I think it is not necessary at this moment.  Increase activity slowly.  Discussed icing regimen.  Follow-up again 6 to 8 weeks

## 2024-06-03 NOTE — Patient Instructions (Addendum)
 GT injection Thigh compression sleeve See me in 8 weeks

## 2024-06-05 DIAGNOSIS — J301 Allergic rhinitis due to pollen: Secondary | ICD-10-CM | POA: Diagnosis not present

## 2024-06-05 DIAGNOSIS — J3089 Other allergic rhinitis: Secondary | ICD-10-CM | POA: Diagnosis not present

## 2024-06-05 DIAGNOSIS — J3081 Allergic rhinitis due to animal (cat) (dog) hair and dander: Secondary | ICD-10-CM | POA: Diagnosis not present

## 2024-06-06 ENCOUNTER — Ambulatory Visit (INDEPENDENT_AMBULATORY_CARE_PROVIDER_SITE_OTHER): Admitting: Internal Medicine

## 2024-06-06 ENCOUNTER — Encounter: Payer: Self-pay | Admitting: Internal Medicine

## 2024-06-06 VITALS — BP 110/70 | HR 55 | Temp 98.0°F | Wt 171.9 lb

## 2024-06-06 DIAGNOSIS — E782 Mixed hyperlipidemia: Secondary | ICD-10-CM | POA: Diagnosis not present

## 2024-06-06 DIAGNOSIS — D508 Other iron deficiency anemias: Secondary | ICD-10-CM | POA: Diagnosis not present

## 2024-06-06 DIAGNOSIS — Z23 Encounter for immunization: Secondary | ICD-10-CM

## 2024-06-06 DIAGNOSIS — N529 Male erectile dysfunction, unspecified: Secondary | ICD-10-CM

## 2024-06-06 LAB — LIPID PANEL
Cholesterol: 176 mg/dL (ref 0–200)
HDL: 82.1 mg/dL (ref >39.00)
LDL Cholesterol: 84 mg/dL (ref 0–99)
NonHDL: 94.1
Total CHOL/HDL Ratio: 2
Triglycerides: 52 mg/dL (ref 0.0–149.0)
VLDL: 10.4 mg/dL (ref 0.0–40.0)

## 2024-06-06 LAB — CBC WITH DIFFERENTIAL/PLATELET
Basophils Absolute: 0 K/uL (ref 0.0–0.1)
Basophils Relative: 0.5 % (ref 0.0–3.0)
Eosinophils Absolute: 0.1 K/uL (ref 0.0–0.7)
Eosinophils Relative: 1.7 % (ref 0.0–5.0)
HCT: 45.9 % (ref 39.0–52.0)
Hemoglobin: 15.1 g/dL (ref 13.0–17.0)
Lymphocytes Relative: 16.4 % (ref 12.0–46.0)
Lymphs Abs: 1.1 K/uL (ref 0.7–4.0)
MCHC: 33 g/dL (ref 30.0–36.0)
MCV: 93.7 fl (ref 78.0–100.0)
Monocytes Absolute: 0.6 K/uL (ref 0.1–1.0)
Monocytes Relative: 9.5 % (ref 3.0–12.0)
Neutro Abs: 4.7 K/uL (ref 1.4–7.7)
Neutrophils Relative %: 71.9 % (ref 43.0–77.0)
Platelets: 231 K/uL (ref 150.0–400.0)
RBC: 4.9 Mil/uL (ref 4.22–5.81)
RDW: 14.8 % (ref 11.5–15.5)
WBC: 6.5 K/uL (ref 4.0–10.5)

## 2024-06-06 LAB — IBC + FERRITIN
Ferritin: 41.9 ng/mL (ref 22.0–322.0)
Iron: 112 ug/dL (ref 42–165)
Saturation Ratios: 35.9 % (ref 20.0–50.0)
TIBC: 312.2 ug/dL (ref 250.0–450.0)
Transferrin: 223 mg/dL (ref 212.0–360.0)

## 2024-06-06 NOTE — Addendum Note (Signed)
 Addended by: KATHRYNE MILLMAN B on: 06/06/2024 02:12 PM   Modules accepted: Orders

## 2024-06-06 NOTE — Progress Notes (Signed)
 Established Patient Office Visit     CC/Reason for Visit: Follow-up chronic conditions, medication refills  HPI: Stanley Bennett is a 68 y.o. male who is coming in today for the above mentioned reasons. Past Medical History is significant for: Hyperlipidemia, vitamin D  deficiency, iron deficiency anemia, aortic atherosclerosis.  Feeling well, no concerns or complaints.  Requesting flu vaccine   Past Medical/Surgical History: Past Medical History:  Diagnosis Date   Allergy    seasonal allergies   Arthritis    generalized   Basal cell carcinoma    scalp   GERD (gastroesophageal reflux disease)    on meds   Gout    on gout   Hyperlipidemia    on meds    Past Surgical History:  Procedure Laterality Date   APPENDECTOMY     BASAL CELL CARCINOMA EXCISION     CERVICAL DISC SURGERY     COLONOSCOPY  05/2017   MAC-suprep(exc)-polyps   TONSILLECTOMY     UPPER GASTROINTESTINAL ENDOSCOPY  05/2017   normal   VASECTOMY     WISDOM TOOTH EXTRACTION      Social History:  reports that he has never smoked. He has never used smokeless tobacco. He reports current alcohol use of about 5.0 standard drinks of alcohol per week. He reports that he does not use drugs.  Allergies: Allergies  Allergen Reactions   Teriflunomide Other (See Comments)   Tamiflu  [Oseltamivir ] Rash    Mild rash 2018    Family History:  Family History  Problem Relation Age of Onset   Arthritis Mother        osteo   Arthritis Father        osteo   Heart disease Father        (pneumonia cause of death 37 yrs)   Prostate cancer Father    Diabetes Sister    Ovarian cancer Sister    Other Brother        celiac sprue   Prostate cancer Brother 24   Kidney failure Cousin        pat cousin   Kidney failure Cousin        pat cousin   Colon cancer Neg Hx    Esophageal cancer Neg Hx    Rectal cancer Neg Hx    Colon polyps Neg Hx    Stomach cancer Neg Hx      Current Outpatient Medications:     allopurinol  (ZYLOPRIM ) 300 MG tablet, TAKE 1 TABLET BY MOUTH DAILY AS DIRECTED, Disp: 90 tablet, Rfl: 0   aspirin  EC 81 MG tablet, Take 1 tablet (81 mg total) by mouth daily., Disp: 90 tablet, Rfl: 3   EPINEPHrine 0.3 mg/0.3 mL IJ SOAJ injection, SMARTSIG:Injection As Directed, Disp: , Rfl:    fluticasone (FLONASE) 50 MCG/ACT nasal spray, Place 1 spray into both nostrils 2 (two) times daily. , Disp: , Rfl:    loratadine (CLARITIN) 10 MG tablet, Take 10 mg by mouth daily., Disp: , Rfl:    pantoprazole  (PROTONIX ) 20 MG tablet, TAKE 1 TABLET BY MOUTH DAILY BEFORE BREAKFAST, Disp: 90 tablet, Rfl: 0   PRESCRIPTION MEDICATION, Allergy shots every other week, Disp: , Rfl:    rosuvastatin  (CRESTOR ) 20 MG tablet, TAKE 1 TABLET BY MOUTH DAILY, Disp: 90 tablet, Rfl: 0   tadalafil  (CIALIS ) 5 MG tablet, Take 1 tablet (5 mg total) by mouth daily., Disp: 30 tablet, Rfl: 0   Vitamin D , Ergocalciferol , (DRISDOL ) 1.25 MG (50000 UNIT) CAPS capsule,  TAKE ONE CAPSULE BY MOUTH EVERY SEVEN DAYS, Disp: 12 capsule, Rfl: 0  Review of Systems:  Negative unless indicated in HPI.   Physical Exam: Vitals:   06/06/24 1102  BP: 110/70  Pulse: (!) 55  Temp: 98 F (36.7 C)  TempSrc: Oral  SpO2: 98%  Weight: 171 lb 14.4 oz (78 kg)    Body mass index is 24.67 kg/m.   Physical Exam Vitals reviewed.  Constitutional:      Appearance: Normal appearance.  HENT:     Head: Normocephalic and atraumatic.  Eyes:     Conjunctiva/sclera: Conjunctivae normal.  Cardiovascular:     Rate and Rhythm: Normal rate and regular rhythm.  Pulmonary:     Effort: Pulmonary effort is normal.     Breath sounds: Normal breath sounds.  Skin:    General: Skin is warm and dry.  Neurological:     General: No focal deficit present.     Mental Status: He is alert and oriented to person, place, and time.  Psychiatric:        Mood and Affect: Mood normal.        Behavior: Behavior normal.        Thought Content: Thought content  normal.        Judgment: Judgment normal.      Impression and Plan:  Other iron deficiency anemia -     CBC with Differential/Platelet; Future -     IBC + Ferritin; Future  Erectile dysfunction, unspecified erectile dysfunction type  Mixed hyperlipidemia -     Lipid panel; Future  Immunization due  - Check lipid panel, CBC and iron studies.  Medications refilled. - Flu vaccine administered today.   Time spent:30 minutes reviewing chart, interviewing and examining patient and formulating plan of care.     Tully Theophilus Andrews, MD [facility]

## 2024-06-12 DIAGNOSIS — J3089 Other allergic rhinitis: Secondary | ICD-10-CM | POA: Diagnosis not present

## 2024-06-12 DIAGNOSIS — J301 Allergic rhinitis due to pollen: Secondary | ICD-10-CM | POA: Diagnosis not present

## 2024-06-12 DIAGNOSIS — J3081 Allergic rhinitis due to animal (cat) (dog) hair and dander: Secondary | ICD-10-CM | POA: Diagnosis not present

## 2024-06-13 ENCOUNTER — Ambulatory Visit

## 2024-06-17 ENCOUNTER — Ambulatory Visit: Payer: Self-pay | Admitting: Internal Medicine

## 2024-06-19 DIAGNOSIS — J301 Allergic rhinitis due to pollen: Secondary | ICD-10-CM | POA: Diagnosis not present

## 2024-06-19 DIAGNOSIS — J3089 Other allergic rhinitis: Secondary | ICD-10-CM | POA: Diagnosis not present

## 2024-06-19 DIAGNOSIS — J3081 Allergic rhinitis due to animal (cat) (dog) hair and dander: Secondary | ICD-10-CM | POA: Diagnosis not present

## 2024-06-26 DIAGNOSIS — J3089 Other allergic rhinitis: Secondary | ICD-10-CM | POA: Diagnosis not present

## 2024-06-26 DIAGNOSIS — J3081 Allergic rhinitis due to animal (cat) (dog) hair and dander: Secondary | ICD-10-CM | POA: Diagnosis not present

## 2024-06-26 DIAGNOSIS — J301 Allergic rhinitis due to pollen: Secondary | ICD-10-CM | POA: Diagnosis not present

## 2024-06-27 ENCOUNTER — Encounter: Payer: Self-pay | Admitting: Internal Medicine

## 2024-06-27 DIAGNOSIS — E782 Mixed hyperlipidemia: Secondary | ICD-10-CM

## 2024-06-27 DIAGNOSIS — N401 Enlarged prostate with lower urinary tract symptoms: Secondary | ICD-10-CM

## 2024-06-27 DIAGNOSIS — E7800 Pure hypercholesterolemia, unspecified: Secondary | ICD-10-CM

## 2024-06-27 DIAGNOSIS — R7989 Other specified abnormal findings of blood chemistry: Secondary | ICD-10-CM

## 2024-06-27 DIAGNOSIS — D508 Other iron deficiency anemias: Secondary | ICD-10-CM

## 2024-06-27 DIAGNOSIS — E538 Deficiency of other specified B group vitamins: Secondary | ICD-10-CM

## 2024-06-30 ENCOUNTER — Other Ambulatory Visit: Payer: Self-pay | Admitting: Family Medicine

## 2024-07-01 ENCOUNTER — Other Ambulatory Visit

## 2024-07-01 ENCOUNTER — Other Ambulatory Visit: Payer: Self-pay | Admitting: Internal Medicine

## 2024-07-01 ENCOUNTER — Other Ambulatory Visit: Payer: Self-pay | Admitting: Family Medicine

## 2024-07-01 DIAGNOSIS — R7989 Other specified abnormal findings of blood chemistry: Secondary | ICD-10-CM | POA: Diagnosis not present

## 2024-07-01 DIAGNOSIS — E538 Deficiency of other specified B group vitamins: Secondary | ICD-10-CM | POA: Diagnosis not present

## 2024-07-01 DIAGNOSIS — J3081 Allergic rhinitis due to animal (cat) (dog) hair and dander: Secondary | ICD-10-CM | POA: Diagnosis not present

## 2024-07-01 DIAGNOSIS — R3912 Poor urinary stream: Secondary | ICD-10-CM | POA: Diagnosis not present

## 2024-07-01 DIAGNOSIS — E782 Mixed hyperlipidemia: Secondary | ICD-10-CM

## 2024-07-01 DIAGNOSIS — J301 Allergic rhinitis due to pollen: Secondary | ICD-10-CM | POA: Diagnosis not present

## 2024-07-01 DIAGNOSIS — N401 Enlarged prostate with lower urinary tract symptoms: Secondary | ICD-10-CM

## 2024-07-01 DIAGNOSIS — D508 Other iron deficiency anemias: Secondary | ICD-10-CM

## 2024-07-01 DIAGNOSIS — J3089 Other allergic rhinitis: Secondary | ICD-10-CM | POA: Diagnosis not present

## 2024-07-01 LAB — LIPID PANEL
Cholesterol: 161 mg/dL (ref 0–200)
HDL: 77.9 mg/dL (ref >39.00)
LDL Cholesterol: 70 mg/dL (ref 0–99)
NonHDL: 82.72
Total CHOL/HDL Ratio: 2
Triglycerides: 63 mg/dL (ref 0.0–149.0)
VLDL: 12.6 mg/dL (ref 0.0–40.0)

## 2024-07-01 LAB — PSA: PSA: 1.68 ng/mL (ref 0.10–4.00)

## 2024-07-01 LAB — CBC WITH DIFFERENTIAL/PLATELET
Basophils Absolute: 0 K/uL (ref 0.0–0.1)
Basophils Relative: 0.4 % (ref 0.0–3.0)
Eosinophils Absolute: 0.1 K/uL (ref 0.0–0.7)
Eosinophils Relative: 1 % (ref 0.0–5.0)
HCT: 45.5 % (ref 39.0–52.0)
Hemoglobin: 15.1 g/dL (ref 13.0–17.0)
Lymphocytes Relative: 17.8 % (ref 12.0–46.0)
Lymphs Abs: 1.1 K/uL (ref 0.7–4.0)
MCHC: 33.2 g/dL (ref 30.0–36.0)
MCV: 93.6 fl (ref 78.0–100.0)
Monocytes Absolute: 0.5 K/uL (ref 0.1–1.0)
Monocytes Relative: 8.1 % (ref 3.0–12.0)
Neutro Abs: 4.4 K/uL (ref 1.4–7.7)
Neutrophils Relative %: 72.7 % (ref 43.0–77.0)
Platelets: 219 K/uL (ref 150.0–400.0)
RBC: 4.86 Mil/uL (ref 4.22–5.81)
RDW: 14.4 % (ref 11.5–15.5)
WBC: 6.1 K/uL (ref 4.0–10.5)

## 2024-07-01 LAB — COMPREHENSIVE METABOLIC PANEL WITH GFR
ALT: 21 U/L (ref 0–53)
AST: 26 U/L (ref 0–37)
Albumin: 4.2 g/dL (ref 3.5–5.2)
Alkaline Phosphatase: 51 U/L (ref 39–117)
BUN: 16 mg/dL (ref 6–23)
CO2: 25 meq/L (ref 19–32)
Calcium: 8.9 mg/dL (ref 8.4–10.5)
Chloride: 99 meq/L (ref 96–112)
Creatinine, Ser: 0.8 mg/dL (ref 0.40–1.50)
GFR: 89.93 mL/min (ref >60.00)
Glucose, Bld: 80 mg/dL (ref 70–99)
Potassium: 4.1 meq/L (ref 3.5–5.1)
Sodium: 136 meq/L (ref 135–145)
Total Bilirubin: 1.2 mg/dL (ref 0.2–1.2)
Total Protein: 6.8 g/dL (ref 6.0–8.3)

## 2024-07-01 LAB — TSH: TSH: 4.69 u[IU]/mL (ref 0.35–5.50)

## 2024-07-01 LAB — VITAMIN D 25 HYDROXY (VIT D DEFICIENCY, FRACTURES): VITD: 43.47 ng/mL (ref 30.00–100.00)

## 2024-07-01 LAB — VITAMIN B12: Vitamin B-12: 575 pg/mL (ref 211–911)

## 2024-07-02 ENCOUNTER — Ambulatory Visit: Payer: Self-pay | Admitting: Internal Medicine

## 2024-07-08 ENCOUNTER — Ambulatory Visit: Admitting: Internal Medicine

## 2024-07-08 ENCOUNTER — Encounter

## 2024-07-08 ENCOUNTER — Encounter: Payer: Self-pay | Admitting: Internal Medicine

## 2024-07-08 VITALS — BP 110/70 | HR 64 | Temp 98.1°F | Ht 70.0 in | Wt 173.0 lb

## 2024-07-08 DIAGNOSIS — Z0000 Encounter for general adult medical examination without abnormal findings: Secondary | ICD-10-CM | POA: Diagnosis not present

## 2024-07-08 DIAGNOSIS — R3912 Poor urinary stream: Secondary | ICD-10-CM

## 2024-07-08 DIAGNOSIS — E538 Deficiency of other specified B group vitamins: Secondary | ICD-10-CM | POA: Diagnosis not present

## 2024-07-08 DIAGNOSIS — N529 Male erectile dysfunction, unspecified: Secondary | ICD-10-CM | POA: Diagnosis not present

## 2024-07-08 DIAGNOSIS — E7800 Pure hypercholesterolemia, unspecified: Secondary | ICD-10-CM | POA: Diagnosis not present

## 2024-07-08 DIAGNOSIS — J3081 Allergic rhinitis due to animal (cat) (dog) hair and dander: Secondary | ICD-10-CM | POA: Diagnosis not present

## 2024-07-08 DIAGNOSIS — R7989 Other specified abnormal findings of blood chemistry: Secondary | ICD-10-CM | POA: Diagnosis not present

## 2024-07-08 DIAGNOSIS — J301 Allergic rhinitis due to pollen: Secondary | ICD-10-CM | POA: Diagnosis not present

## 2024-07-08 DIAGNOSIS — J3089 Other allergic rhinitis: Secondary | ICD-10-CM | POA: Diagnosis not present

## 2024-07-08 DIAGNOSIS — D508 Other iron deficiency anemias: Secondary | ICD-10-CM

## 2024-07-08 DIAGNOSIS — N401 Enlarged prostate with lower urinary tract symptoms: Secondary | ICD-10-CM

## 2024-07-08 DIAGNOSIS — E782 Mixed hyperlipidemia: Secondary | ICD-10-CM

## 2024-07-08 NOTE — Progress Notes (Signed)
 Medicare Wellness question were answered 06/05/24 and 07/08/24.

## 2024-07-08 NOTE — Progress Notes (Signed)
 Established Patient Office Visit     CC/Reason for Visit: Annual preventive exam and subsequent Medicare wellness visit  HPI: Stanley Bennett is a 68 y.o. male who is coming in today for the above mentioned reasons. Past Medical History is significant for: Hyperlipidemia, vitamin D  deficiency, iron deficiency anemia.  Feeling well.  No acute concerns or complaints.  Has routine eye and dental care.  Is due for COVID and RSV vaccines.  Had a colonoscopy in 2022.   Past Medical/Surgical History: Past Medical History:  Diagnosis Date   Allergy    seasonal allergies   Arthritis    generalized   Basal cell carcinoma    scalp   GERD (gastroesophageal reflux disease)    on meds   Gout    on gout   Hyperlipidemia    on meds    Past Surgical History:  Procedure Laterality Date   APPENDECTOMY     BASAL CELL CARCINOMA EXCISION     CERVICAL DISC SURGERY     COLONOSCOPY  05/2017   MAC-suprep(exc)-polyps   TONSILLECTOMY     UPPER GASTROINTESTINAL ENDOSCOPY  05/2017   normal   VASECTOMY     WISDOM TOOTH EXTRACTION      Social History:  reports that he has never smoked. He has never used smokeless tobacco. He reports current alcohol use of about 5.0 standard drinks of alcohol per week. He reports that he does not use drugs.  Allergies: Allergies  Allergen Reactions   Teriflunomide Other (See Comments)   Tamiflu  [Oseltamivir ] Rash    Mild rash 2018    Family History:  Family History  Problem Relation Age of Onset   Arthritis Mother        osteo   Arthritis Father        osteo   Heart disease Father        (pneumonia cause of death 72 yrs)   Prostate cancer Father    Diabetes Sister    Ovarian cancer Sister    Other Brother        celiac sprue   Prostate cancer Brother 14   Kidney failure Cousin        pat cousin   Kidney failure Cousin        pat cousin   Colon cancer Neg Hx    Esophageal cancer Neg Hx    Rectal cancer Neg Hx    Colon polyps Neg Hx     Stomach cancer Neg Hx      Current Outpatient Medications:    allopurinol  (ZYLOPRIM ) 300 MG tablet, TAKE 1 TABLET BY MOUTH DAILY AS DIRECTED, Disp: 90 tablet, Rfl: 0   aspirin  EC 81 MG tablet, Take 1 tablet (81 mg total) by mouth daily., Disp: 90 tablet, Rfl: 3   EPINEPHrine 0.3 mg/0.3 mL IJ SOAJ injection, SMARTSIG:Injection As Directed, Disp: , Rfl:    fluticasone (FLONASE) 50 MCG/ACT nasal spray, Place 1 spray into both nostrils 2 (two) times daily. , Disp: , Rfl:    loratadine (CLARITIN) 10 MG tablet, Take 10 mg by mouth daily., Disp: , Rfl:    pantoprazole  (PROTONIX ) 20 MG tablet, TAKE 1 TABLET BY MOUTH DAILY BEFORE BREAKFAST, Disp: 90 tablet, Rfl: 0   PRESCRIPTION MEDICATION, Allergy shots every other week, Disp: , Rfl:    rosuvastatin  (CRESTOR ) 20 MG tablet, TAKE 1 TABLET BY MOUTH DAILY, Disp: 90 tablet, Rfl: 0   tadalafil  (CIALIS ) 5 MG tablet, TAKE 1 TABLET BY MOUTH DAILY,  Disp: 30 tablet, Rfl: 0   tiZANidine  (ZANAFLEX ) 2 MG tablet, TAKE 1 TABLET BY MOUTH AT BEDTIME, Disp: 30 tablet, Rfl: 0   Vitamin D , Ergocalciferol , (DRISDOL ) 1.25 MG (50000 UNIT) CAPS capsule, TAKE ONE CAPSULE BY MOUTH EVERY SEVEN DAYS, Disp: 12 capsule, Rfl: 0  Review of Systems:  Negative unless indicated in HPI.   Physical Exam: Vitals:   07/08/24 1501  BP: 110/70  Pulse: 64  Temp: 98.1 F (36.7 C)  TempSrc: Oral  SpO2: 98%  Weight: 173 lb (78.5 kg)  Height: 5' 10 (1.778 m)    Body mass index is 24.82 kg/m.   Physical Exam Vitals reviewed.  Constitutional:      General: He is not in acute distress.    Appearance: Normal appearance. He is not ill-appearing, toxic-appearing or diaphoretic.  HENT:     Head: Normocephalic.     Right Ear: Tympanic membrane, ear canal and external ear normal. There is no impacted cerumen.     Left Ear: Tympanic membrane, ear canal and external ear normal. There is no impacted cerumen.     Nose: Nose normal.     Mouth/Throat:     Mouth: Mucous membranes  are moist.     Pharynx: Oropharynx is clear. No oropharyngeal exudate or posterior oropharyngeal erythema.  Eyes:     General: No scleral icterus.       Right eye: No discharge.        Left eye: No discharge.     Conjunctiva/sclera: Conjunctivae normal.     Pupils: Pupils are equal, round, and reactive to light.  Neck:     Vascular: No carotid bruit.  Cardiovascular:     Rate and Rhythm: Normal rate and regular rhythm.     Pulses: Normal pulses.     Heart sounds: Normal heart sounds.  Pulmonary:     Effort: Pulmonary effort is normal. No respiratory distress.     Breath sounds: Normal breath sounds.  Abdominal:     General: Abdomen is flat. Bowel sounds are normal.     Palpations: Abdomen is soft.  Musculoskeletal:        General: Normal range of motion.     Cervical back: Normal range of motion.  Skin:    General: Skin is warm and dry.  Neurological:     General: No focal deficit present.     Mental Status: He is alert and oriented to person, place, and time. Mental status is at baseline.  Psychiatric:        Mood and Affect: Mood normal.        Behavior: Behavior normal.        Thought Content: Thought content normal.        Judgment: Judgment normal.    Subsequent Medicare wellness visit   Visit info / Clinical Intake: Medicare Wellness Visit Type:: Subsequent Annual Wellness Visit Persons participating in visit:: patient Medicare Wellness Visit Mode:: In-person (required for WTM) Information given by:: patient Interpreter Needed?: No Pre-visit prep was completed: yes AWV questionnaire completed by patient prior to visit?: yes Date:: 06/05/24 Living arrangements:: lives with spouse/significant other Patient's Overall Health Status Rating: good Typical amount of pain: some Does pain affect daily life?: no Are you currently prescribed opioids?: no  Dietary Habits and Nutritional Risks How many meals a day?: 3 Eats fruit and vegetables daily?: yes Most meals are  obtained by: preparing own meals In the last 2 weeks, have you had any of the following?: none Diabetic::  no  Functional Status Activities of Daily Living (to include ambulation/medication): (Patient-Rptd) Independent Ambulation: (Patient-Rptd) Independent Medication Administration: Independent Home Management: (Patient-Rptd) Independent Manage your own finances?: yes Primary transportation is: driving Concerns about vision?: no *vision screening is required for WTM* Concerns about hearing?: no  Fall Screening Falls in the past year?: (Patient-Rptd) 0  Fall Risk Patient at Risk for Falls Due to: No Fall Risks Fall risk Follow up: Falls evaluation completed  Home and Transportation Safety: All rugs have non-skid backing?: yes All stairs or steps have railings?: yes Grab bars in the bathtub or shower?: (!) no Have non-skid surface in bathtub or shower?: yes Good home lighting?: yes Regular seat belt use?: yes Hospital stays in the last year:: no  Cognitive Assessment Difficulty concentrating, remembering, or making decisions? : no Will 6CIT or Mini Cog be Completed: yes What year is it?: 0 points What month is it?: 0 points Give patient an address phrase to remember (5 components): the cow jumped over the moon About what time is it?: 0 points Count backwards from 20 to 1: 0 points Say the months of the year in reverse: 0 points Repeat the address phrase from earlier: 0 points 6 CIT Score: 0 points  Advance Directives (For Healthcare) Does Patient Have a Medical Advance Directive?: Yes Does patient want to make changes to medical advance directive?: No - Patient declined Type of Advance Directive: Healthcare Power of [place]; Living will Copy of Healthcare Power of Attorney in Chart?: No - copy requested Copy of Living Will in Chart?: No - copy requested  Reviewed/Updated  Reviewed/Updated: Reviewed All (Medical, Surgical, Family, Medications, Allergies, Care Teams,  Patient Goals)    Vision Screening   Right eye Left eye Both eyes  Without correction     With correction 20/25 20/25 20/25       Depression/mood:  Flowsheet Row Office Visit from 04/24/2023 in [facility]  PHQ-9 Total Score 1        Counseling: Counseling given: Not Answered     Lab orders based on risk factors: Laboratory update will be reviewed     Screening: Patient provided with a written and personalized 5-10 year screening schedule in the AVS. Health Maintenance  Topic Date Due   COVID-19 Vaccine (7 - 2025-26 season) 04/15/2024   Medicare Annual Wellness Visit  07/06/2024   Colon Cancer Screening  08/25/2025   DTaP/Tdap/Td vaccine (3 - Td or Tdap) 11/23/2026   Pneumococcal Vaccine for age over 56  Completed   Flu Shot  Completed   Hepatitis C Screening  Completed   Zoster (Shingles) Vaccine  Completed   Meningitis B Vaccine  Aged Out     Provider List Update: Patient Care Team    Relationship Specialty Notifications Start End  Theophilus Andrews, Tully GRADE, MD PCP - General Internal Medicine  01/18/22   Lonni Slain, MD PCP - Cardiology Cardiology  07/11/22   Center, [facility]    12/28/18    Comment: kristin snyder, derm       I have personally reviewed and noted the following in the patient's chart:   Medical and social history Use of alcohol, tobacco or illicit drugs  Current medications and supplements Functional ability and status Nutritional status Physical activity Advanced directives List of other physicians Hospitalizations, surgeries, and ER visits in previous 12 months Vitals Screenings to include cognitive, depression, and falls Referrals and appointments  In addition, I have reviewed and discussed with patient certain preventive  protocols, quality metrics, and best practice recommendations. A written personalized care plan for preventive services as well as general preventive health  recommendations were provided to patient.   Impression and Plan:  Encounter for Medicare annual wellness exam  Benign prostatic hyperplasia with weak urinary stream  Other iron deficiency anemia  Mixed hyperlipidemia  Low serum vitamin D   Pure hypercholesterolemia  Low serum vitamin B12  Erectile dysfunction, unspecified erectile dysfunction type   -Recommend routine eye and dental care. -Healthy lifestyle discussed in detail. -Labs to be updated today. -Prostate cancer screening: PSA normal Health Maintenance  Topic Date Due   COVID-19 Vaccine (7 - 2025-26 season) 04/15/2024   Medicare Annual Wellness Visit  07/06/2024   Colon Cancer Screening  08/25/2025   DTaP/Tdap/Td vaccine (3 - Td or Tdap) 11/23/2026   Pneumococcal Vaccine for age over 60  Completed   Flu Shot  Completed   Hepatitis C Screening  Completed   Zoster (Shingles) Vaccine  Completed   Meningitis B Vaccine  Aged Out     -Advised to update COVID and RSV vaccines.     Tully Theophilus Andrews, MD [facility]

## 2024-07-14 ENCOUNTER — Other Ambulatory Visit (HOSPITAL_BASED_OUTPATIENT_CLINIC_OR_DEPARTMENT_OTHER): Payer: Self-pay | Admitting: Cardiology

## 2024-07-14 ENCOUNTER — Other Ambulatory Visit: Payer: Self-pay | Admitting: Internal Medicine

## 2024-07-14 DIAGNOSIS — E7800 Pure hypercholesterolemia, unspecified: Secondary | ICD-10-CM

## 2024-07-15 NOTE — Progress Notes (Unsigned)
 [name] [address] [place] [address] Phone: [phone] Subjective:   LILLETTE Berwyn Posey, am serving as a scribe for Dr. Arthea Claudene.  I'm seeing this patient by the request  of:  Theophilus Andrews, Tully GRADE, MD  CC: bilateral hip pains   [name]  06/03/2024 More difficulty with the sartorius muscle today than truly the hip joint itself.  Hip x-rays have been fairly unremarkable.  Neck step if patient continues to have difficulty would be a CT of the abdomen and pelvis but I think it is not necessary at this moment.  Increase activity slowly.  Discussed icing regimen.  Follow-up again 6 to 8 weeks     Patient given injection and tolerated the procedure well, discussed icing regimen and home exercises, discussed which activities to do which ones to avoid.  Increase activity slowly.  Discussed icing regimen.  Follow-up again in 6 to 8 weeks     Continue supplementation.     Updated 07/17/2024 KYNGSTON PICKELSIMER is a 68 y.o. male coming in with complaint of R hip pain. Patient states that he is having good and bad days. Pain is at it's worst at night. Muscle relaxer is somewhat helpful. Pain over lateral aspect of both hips. Radiating pain is lessening in R leg.        Past Medical History:  Diagnosis Date   Allergy    seasonal allergies   Arthritis    generalized   Basal cell carcinoma    scalp   GERD (gastroesophageal reflux disease)    on meds   Gout    on gout   Hyperlipidemia    on meds   Past Surgical History:  Procedure Laterality Date   APPENDECTOMY     BASAL CELL CARCINOMA EXCISION     CERVICAL DISC SURGERY     COLONOSCOPY  05/2017   MAC-suprep(exc)-polyps   TONSILLECTOMY     UPPER GASTROINTESTINAL ENDOSCOPY  05/2017   normal   VASECTOMY     WISDOM TOOTH EXTRACTION     Social History   Socioeconomic History   Marital status: Married    Spouse name: Macario   Number of children: 3   Years of education:  Not on file   Highest education level: Bachelor's degree (e.g., BA, AB, BS)  Occupational History   Occupation: Brewing technologist    Comment: retired  Tobacco Use   Smoking status: Never   Smokeless tobacco: Never  Vaping Use   Vaping status: Never Used  Substance and Sexual Activity   Alcohol use: Yes    Alcohol/week: 5.0 standard drinks of alcohol    Types: 5 Standard drinks or equivalent per week    Comment: 5   Drug use: No   Sexual activity: Yes    Partners: Female  Other Topics Concern   Not on file  Social History Narrative   Work or School: Advertising Account Planner - administrator, 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      Home Situation: lives with wife      Spiritual Beliefs: Catholic      Lifestyle: regular exercise and healthy diet   Social Drivers of Corporate Investment Banker Strain: Low Risk  (07/08/2024)   Overall Financial Resource Strain (CARDIA)    Difficulty of Paying Living Expenses: Not hard at all  Food Insecurity: No Food Insecurity (07/08/2024)   Hunger Vital Sign    Worried About Running Out of Food in the Last Year: Never true  Ran Out of Food in the Last Year: Never true  Transportation Needs: No Transportation Needs (07/08/2024)   PRAPARE - Administrator, Civil Service (Medical): No    Lack of Transportation (Non-Medical): No  Physical Activity: Sufficiently Active (07/08/2024)   Exercise Vital Sign    Days of Exercise per Week: 6 days    Minutes of Exercise per Session: 60 min  Stress: Stress Concern Present (07/08/2024)   [name]    Feeling of Stress: To some extent  Social Connections: Socially Integrated (07/08/2024)   Social Connection and Isolation Panel    Frequency of Communication with Friends and Family: Twice a week    Frequency of Social Gatherings with Friends and Family: More than three times a week    Attends Religious Services: More than 4 times per year     Active Member of [employer] or Organizations: Yes    Attends Engineer, Structural: More than 4 times per year    Marital Status: Married   Allergies  Allergen Reactions   Teriflunomide Other (See Comments)   Tamiflu  [Oseltamivir ] Rash    Mild rash 2018   Family History  Problem Relation Age of Onset   Arthritis Mother        osteo   Arthritis Father        osteo   Heart disease Father        (pneumonia cause of death 7 yrs)   Prostate cancer Father    Diabetes Sister    Ovarian cancer Sister    Other Brother        celiac sprue   Prostate cancer Brother 63   Kidney failure Cousin        pat cousin   Kidney failure Cousin        pat cousin   Colon cancer Neg Hx    Esophageal cancer Neg Hx    Rectal cancer Neg Hx    Colon polyps Neg Hx    Stomach cancer Neg Hx      Current Outpatient Medications (Cardiovascular):    EPINEPHrine 0.3 mg/0.3 mL IJ SOAJ injection, SMARTSIG:Injection As Directed   rosuvastatin  (CRESTOR ) 20 MG tablet, TAKE 1 TABLET BY MOUTH DAILY   tadalafil  (CIALIS ) 5 MG tablet, TAKE 1 TABLET BY MOUTH DAILY  Current Outpatient Medications (Respiratory):    fluticasone (FLONASE) 50 MCG/ACT nasal spray, Place 1 spray into both nostrils 2 (two) times daily.    loratadine (CLARITIN) 10 MG tablet, Take 10 mg by mouth daily.  Current Outpatient Medications (Analgesics):    allopurinol  (ZYLOPRIM ) 300 MG tablet, TAKE 1 TABLET BY MOUTH DAILY AS DIRECTED   aspirin  EC 81 MG tablet, Take 1 tablet (81 mg total) by mouth daily.   Current Outpatient Medications (Other):    pantoprazole  (PROTONIX ) 20 MG tablet, TAKE 1 TABLET BY MOUTH DAILY BEFORE BREAKFAST   PRESCRIPTION MEDICATION, Allergy shots every other week   tiZANidine  (ZANAFLEX ) 4 MG tablet, Take 1 tablet (4 mg total) by mouth Nightly.   Vitamin D , Ergocalciferol , (DRISDOL ) 1.25 MG (50000 UNIT) CAPS capsule, TAKE ONE CAPSULE BY MOUTH EVERY SEVEN DAYS   Reviewed prior external information including notes  and imaging from  primary care provider As well as notes that were available from care everywhere and other healthcare systems.  Past medical history, social, surgical and family history all reviewed in electronic medical record.  No pertanent information unless stated regarding to the chief complaint.  Review of Systems:  No headache, visual changes, nausea, vomiting, diarrhea, constipation, dizziness, abdominal pain, skin rash, fevers, chills, night sweats, weight loss, swollen lymph nodes, body aches, joint swelling, chest pain, shortness of breath, mood changes. POSITIVE muscle aches  Objective  Blood pressure 118/78, pulse (!) 52, height 5' 10 (1.778 m), weight 174 lb (78.9 kg), SpO2 98%.   General: No apparent distress alert and oriented x3 mood and affect normal, dressed appropriately.  HEENT: Pupils equal, extraocular movements intact  Respiratory: Patient's speak in full sentences and does not appear short of breath  Cardiovascular: No lower extremity edema, non tender, no erythema  Bilateral hip severely tender to palpation in the paraspinal greater trochanteric area.  Tightness noted with straight leg test noted.   Procedure: Real-time Ultrasound Guided Injection of right greater trochanteric bursitis secondary to patient's body habitus Device: GE Logiq Q7 Ultrasound guided injection is preferred based studies that show increased duration, increased effect, greater accuracy, decreased procedural pain, increased response rate, and decreased cost with ultrasound guided versus blind injection.  Verbal informed consent obtained.  Time-out conducted.  Noted no overlying erythema, induration, or other signs of local infection.  Skin prepped in a sterile fashion.  Local anesthesia: Topical Ethyl chloride.  With sterile technique and under real time ultrasound guidance:  Greater trochanteric area was visualized and patient's bursa was noted. A 22-gauge 3 inch needle was inserted and 4  cc of 0.5% Marcaine and 1 cc of Kenalog 40 mg/dL was injected. Pictures taken Completed without difficulty  Pain immediately resolved suggesting accurate placement of the medication.  Advised to call if fevers/chills, erythema, induration, drainage, or persistent bleeding.  Images permanently stored  Impression: Technically successful ultrasound guided injection.   Procedure: Real-time Ultrasound Guided Injection of left  greater trochanteric bursitis secondary to patient's body habitus Device: GE Logiq Q7  Ultrasound guided injection is preferred based studies that show increased duration, increased effect, greater accuracy, decreased procedural pain, increased response rate, and decreased cost with ultrasound guided versus blind injection.  Verbal informed consent obtained.  Time-out conducted.  Noted no overlying erythema, induration, or other signs of local infection.  Skin prepped in a sterile fashion.  Local anesthesia: Topical Ethyl chloride.  With sterile technique and under real time ultrasound guidance:  Greater trochanteric area was visualized and patient's bursa was noted. A 22-gauge 3 inch needle was inserted and 4 cc of 0.5% Marcaine and 1 cc of Kenalog 40 mg/dL was injected. Pictures taken Completed without difficulty  Pain immediately resolved suggesting accurate placement of the medication.  Advised to call if fevers/chills, erythema, induration, drainage, or persistent bleeding.  Images permanently stored  Impression: Technically successful ultrasound guided injection.    Impression and Recommendations:     The above documentation has been reviewed and is accurate and complete Leela Vanbrocklin M Jacqueleen Pulver, DO

## 2024-07-17 ENCOUNTER — Other Ambulatory Visit: Payer: Self-pay

## 2024-07-17 ENCOUNTER — Ambulatory Visit: Admitting: Family Medicine

## 2024-07-17 VITALS — BP 118/78 | HR 52 | Ht 70.0 in | Wt 174.0 lb

## 2024-07-17 DIAGNOSIS — M7061 Trochanteric bursitis, right hip: Secondary | ICD-10-CM

## 2024-07-17 DIAGNOSIS — M25551 Pain in right hip: Secondary | ICD-10-CM

## 2024-07-17 DIAGNOSIS — E538 Deficiency of other specified B group vitamins: Secondary | ICD-10-CM

## 2024-07-17 MED ORDER — CYANOCOBALAMIN 1000 MCG/ML IJ SOLN
1000.0000 ug | Freq: Once | INTRAMUSCULAR | Status: AC
  Administered 2024-07-17: 1000 ug via INTRAMUSCULAR

## 2024-07-17 MED ORDER — TIZANIDINE HCL 4 MG PO TABS: 4.0000 mg | ORAL_TABLET | Freq: Every evening | ORAL | 2 refills | Status: AC

## 2024-07-17 NOTE — Assessment & Plan Note (Signed)
 Bilateral injections given, tolerated the procedure well, discussed which activities to do and which ones to avoid.  Increase activity slowly.  Follow-up again in 6 to 8 weeks.

## 2024-07-17 NOTE — Patient Instructions (Addendum)
 Good to see you.  Happy holidays! Injected gluteal tendon today bilaterally Ice 20 minutes 2 times daily. Usually after activity and before bed. Continue the exercises  Try the zanaflex  at 4mg  at night and sent in new medicine  B12 injection today See me again in 10-12 weeks

## 2024-07-18 ENCOUNTER — Encounter: Payer: Self-pay | Admitting: Family Medicine

## 2024-07-18 DIAGNOSIS — J3081 Allergic rhinitis due to animal (cat) (dog) hair and dander: Secondary | ICD-10-CM | POA: Diagnosis not present

## 2024-07-18 DIAGNOSIS — J3089 Other allergic rhinitis: Secondary | ICD-10-CM | POA: Diagnosis not present

## 2024-07-18 DIAGNOSIS — J301 Allergic rhinitis due to pollen: Secondary | ICD-10-CM | POA: Diagnosis not present

## 2024-08-02 ENCOUNTER — Other Ambulatory Visit: Payer: Self-pay | Admitting: Internal Medicine

## 2024-08-02 DIAGNOSIS — N401 Enlarged prostate with lower urinary tract symptoms: Secondary | ICD-10-CM

## 2024-08-13 ENCOUNTER — Ambulatory Visit
Admission: EM | Admit: 2024-08-13 | Discharge: 2024-08-13 | Disposition: A | Discharge status: Home / Self Care | Attending: Family Medicine | Admitting: Family Medicine

## 2024-08-13 DIAGNOSIS — H6122 Impacted cerumen, left ear: Secondary | ICD-10-CM

## 2024-08-13 MED ORDER — CARBAMIDE PEROXIDE 6.5 % OT SOLN: 5.0000 [drp] | Freq: Every day | OTIC | 0 refills | Status: AC | PRN

## 2024-08-13 NOTE — ED Provider Notes (Signed)
 " [facility]  Note:  This document was prepared using Conservation officer, historic buildings and may include unintentional dictation errors.  MRN: [id number] DOB: 1956/06/17  Subjective:   Stanley Bennett is a 68 y.o. male presenting for 1 day history of recurrent left ear fullness.  Has a history of cerumen impaction.  Tried to use peroxide but could not get it cleared.  No fever, bleeding, pain.  Current Outpatient Medications  Medication Instructions   allopurinol  (ZYLOPRIM ) 300 mg, Oral, Daily, as directed   aspirin  EC 81 mg, Oral, Daily   EPINEPHrine 0.3 mg/0.3 mL IJ SOAJ injection SMARTSIG:Injection As Directed   fluticasone (FLONASE) 50 MCG/ACT nasal spray 1 spray, 2 times daily   loratadine (CLARITIN) 10 mg, Daily   pantoprazole  (PROTONIX ) 20 mg, Oral, Daily before breakfast   PRESCRIPTION MEDICATION Allergy shots every other week   rosuvastatin  (CRESTOR ) 20 mg, Oral, Daily   tadalafil  (CIALIS ) 5 mg, Oral, Daily   tiZANidine  (ZANAFLEX ) 4 mg, Oral, (Dosepack) Nightly - one time   Vitamin D , Ergocalciferol , (DRISDOL ) 1.25 MG (50000 UNIT) CAPS capsule TAKE ONE CAPSULE BY MOUTH EVERY SEVEN DAYS    Allergies[1]  Past Medical History:  Diagnosis Date   Allergy    seasonal allergies   Arthritis    generalized   Basal cell carcinoma    scalp   GERD (gastroesophageal reflux disease)    on meds   Gout    on gout   Hyperlipidemia    on meds     Past Surgical History:  Procedure Laterality Date   APPENDECTOMY     BASAL CELL CARCINOMA EXCISION     CERVICAL DISC SURGERY     COLONOSCOPY  05/2017   MAC-suprep(exc)-polyps   TONSILLECTOMY     UPPER GASTROINTESTINAL ENDOSCOPY  05/2017   normal   VASECTOMY     WISDOM TOOTH EXTRACTION      Family History  Problem Relation Age of Onset   Arthritis Mother        osteo   Arthritis Father        osteo   Heart disease Father        (pneumonia cause of death 66 yrs)   Prostate cancer Father     Diabetes Sister    Ovarian cancer Sister    Other Brother        celiac sprue   Prostate cancer Brother 77   Kidney failure Cousin        pat cousin   Kidney failure Cousin        pat cousin   Colon cancer Neg Hx    Esophageal cancer Neg Hx    Rectal cancer Neg Hx    Colon polyps Neg Hx    Stomach cancer Neg Hx     Social History   Occupational History   Occupation: Brewing technologist    Comment: retired  Tobacco Use   Smoking status: Never   Smokeless tobacco: Never  Vaping Use   Vaping status: Never Used  Substance and Sexual Activity   Alcohol use: Yes    Alcohol/week: 5.0 standard drinks of alcohol    Types: 5 Standard drinks or equivalent per week    Comment: 5   Drug use: No   Sexual activity: Yes    Partners: Female     ROS   Objective:   Vitals: BP (!) 143/83 (BP Location: Right Arm)   Pulse (!) 57   Temp 97.8 F (36.6  C) (Oral)   Resp 17   SpO2 98%   Physical Exam Constitutional:      General: He is not in acute distress.    Appearance: Normal appearance. He is well-developed and normal weight. He is not ill-appearing, toxic-appearing or diaphoretic.  HENT:     Head: Normocephalic and atraumatic.     Right Ear: Tympanic membrane, ear canal and external ear normal. There is no impacted cerumen.     Left Ear: Tympanic membrane, ear canal and external ear normal. There is impacted cerumen.     Nose: Nose normal.     Mouth/Throat:     Pharynx: Oropharynx is clear.  Eyes:     General: No scleral icterus.       Right eye: No discharge.        Left eye: No discharge.     Extraocular Movements: Extraocular movements intact.  Cardiovascular:     Rate and Rhythm: Normal rate.  Pulmonary:     Effort: Pulmonary effort is normal.  Musculoskeletal:     Cervical back: Normal range of motion.  Neurological:     Mental Status: He is alert and oriented to person, place, and time.  Psychiatric:        Mood and Affect: Mood normal.         Behavior: Behavior normal.        Thought Content: Thought content normal.        Judgment: Judgment normal.    Ear lavage performed using mixture of peroxide and water.  Pressure irrigation performed using a bottle and a thin ear tube.  Left ear lavage.  No curette was used.   Assessment and Plan :   PDMP not reviewed this encounter.  1. Impacted cerumen of left ear      Successful left ear lavage.  General management of cerumen impaction reviewed with patient.  Anticipatory guidance provided. Counseled patient on potential for adverse effects with medications prescribed/recommended today, ER and return-to-clinic precautions discussed, patient verbalized understanding.     [1]  Allergies Allergen Reactions   Teriflunomide Other (See Comments)   Tamiflu  [Oseltamivir ] Rash    Mild rash 2018     Christopher Savannah, [place] 08/13/24 1722  "

## 2024-08-13 NOTE — ED Triage Notes (Signed)
 Pt c/o left ear fullness  that started yesterday. Pt has used peroxide in ear at home , but no other medications were used.

## 2024-08-17 ENCOUNTER — Other Ambulatory Visit (HOSPITAL_BASED_OUTPATIENT_CLINIC_OR_DEPARTMENT_OTHER): Payer: Self-pay | Admitting: Cardiology

## 2024-08-17 DIAGNOSIS — E7800 Pure hypercholesterolemia, unspecified: Secondary | ICD-10-CM

## 2024-08-19 ENCOUNTER — Ambulatory Visit (INDEPENDENT_AMBULATORY_CARE_PROVIDER_SITE_OTHER)

## 2024-08-19 DIAGNOSIS — E538 Deficiency of other specified B group vitamins: Secondary | ICD-10-CM | POA: Diagnosis not present

## 2024-08-19 MED ORDER — CYANOCOBALAMIN 1000 MCG/ML IJ SOLN
1000.0000 ug | Freq: Once | INTRAMUSCULAR | Status: AC
  Administered 2024-08-19: 1000 ug via INTRAMUSCULAR

## 2024-08-19 NOTE — Progress Notes (Signed)
 Patient received B12 1000 mcg/ml in left arm. Patient tolerated well.

## 2024-08-29 ENCOUNTER — Other Ambulatory Visit: Payer: Self-pay | Admitting: Internal Medicine

## 2024-08-29 DIAGNOSIS — N401 Enlarged prostate with lower urinary tract symptoms: Secondary | ICD-10-CM

## 2024-08-31 ENCOUNTER — Encounter: Payer: Self-pay | Admitting: Family Medicine

## 2024-09-02 ENCOUNTER — Other Ambulatory Visit (HOSPITAL_BASED_OUTPATIENT_CLINIC_OR_DEPARTMENT_OTHER): Payer: Self-pay | Admitting: Cardiology

## 2024-09-02 DIAGNOSIS — E7800 Pure hypercholesterolemia, unspecified: Secondary | ICD-10-CM

## 2024-09-03 NOTE — Progress Notes (Unsigned)
 " [facility] [address] [place] [address] Phone: [phone] Subjective:   Stanley Bennett, am serving as a scribe for Dr. Arthea Bennett.  I'm seeing this patient by the request  of:  Stanley Bennett [name], MD  CC: Left knee pain   [name]  Stanley Bennett is a 69 y.o. male coming in with complaint of L knee pain. Last seen for B hip pain in December 2025. Patient states that last Thursday he noticed some tightness in knee. Friday knee became swollen. Unable to move leg over the weekend without pain. Pain and swelling is improving.     Past Medical History:  Diagnosis Date   Allergy    seasonal allergies   Arthritis    generalized   Basal cell carcinoma    scalp   GERD (gastroesophageal reflux disease)    on meds   Gout    on gout   Hyperlipidemia    on meds   Past Surgical History:  Procedure Laterality Date   APPENDECTOMY     BASAL CELL CARCINOMA EXCISION     CERVICAL DISC SURGERY     COLONOSCOPY  05/2017   MAC-suprep(exc)-polyps   TONSILLECTOMY     UPPER GASTROINTESTINAL ENDOSCOPY  05/2017   normal   VASECTOMY     WISDOM TOOTH EXTRACTION     Social History   Socioeconomic History   Marital status: Married    Spouse name: Stanley Bennett   Number of children: 3   Years of education: Not on file   Highest education level: Bachelor's degree (e.g., BA, AB, BS)  Occupational History   Occupation: Brewing technologist    Comment: retired  Tobacco Use   Smoking status: Never   Smokeless tobacco: Never  Vaping Use   Vaping status: Never Used  Substance and Sexual Activity   Alcohol use: Yes    Alcohol/week: 5.0 standard drinks of alcohol    Types: 5 Standard drinks or equivalent per week    Comment: 5   Drug use: No   Sexual activity: Yes    Partners: Female  Other Topics Concern   Not on file  Social History Narrative   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, 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      Home  Situation: lives with wife      Spiritual Beliefs: Catholic      Lifestyle: regular exercise and healthy diet   Social Drivers of Health   Tobacco Use: Low Risk (08/13/2024)   Patient History    Smoking Tobacco Use: Never    Smokeless Tobacco Use: Never    Passive Exposure: Not on file  Financial Resource Strain: Low Risk (07/08/2024)   Overall Financial Resource Strain (CARDIA)    Difficulty of Paying Living Expenses: Not hard at all  Food Insecurity: No Food Insecurity (07/08/2024)   Epic    Worried About Programme Researcher, Broadcasting/film/video in the Last Year: Never true    Ran Out of Food in the Last Year: Never true  Transportation Needs: No Transportation Needs (07/08/2024)   Epic    Lack of Transportation (Medical): No    Lack of Transportation (Non-Medical): No  Physical Activity: Sufficiently Active (07/08/2024)   Exercise Vital Sign    Days of Exercise per Week: 6 days    Minutes of Exercise per Session: 60 min  Stress: Stress Concern Present (07/08/2024)   [name]    Feeling  of Stress: To some extent  Social Connections: Socially Integrated (07/08/2024)   Social Connection and Isolation Panel    Frequency of Communication with Friends and Family: Twice a week    Frequency of Social Gatherings with Friends and Family: More than three times a week    Attends Religious Services: More than 4 times per year    Active Member of Clubs or Organizations: Yes    Attends Banker Meetings: More than 4 times per year    Marital Status: Married  Depression (PHQ2-9): Low Risk (07/08/2024)   Depression (PHQ2-9)    PHQ-2 Score: 0  Alcohol Screen: Low Risk (06/05/2024)   Alcohol Screen    Last Alcohol Screening Score (AUDIT): 3  Housing: Low Risk (07/08/2024)   Epic    Unable to Pay for Housing in the Last Year: No    Number of Times Moved in the Last Year: 0    Homeless in the Last Year: No  Utilities: Not At  Risk (07/08/2024)   Epic    Threatened with loss of utilities: No  Health Literacy: Adequate Health Literacy (07/08/2024)   B1300 Health Literacy    Frequency of need for help with medical instructions: Never   Allergies[1] Family History  Problem Relation Age of Onset   Arthritis Mother        osteo   Arthritis Father        osteo   Heart disease Father        (pneumonia cause of death 93 yrs)   Prostate cancer Father    Diabetes Sister    Ovarian cancer Sister    Other Brother        celiac sprue   Prostate cancer Brother 60   Kidney failure Cousin        pat cousin   Kidney failure Cousin        pat cousin   Colon cancer Neg Hx    Esophageal cancer Neg Hx    Rectal cancer Neg Hx    Colon polyps Neg Hx    Stomach cancer Neg Hx     Current Outpatient Medications (Cardiovascular):    EPINEPHrine 0.3 mg/0.3 mL IJ SOAJ injection, SMARTSIG:Injection As Directed   rosuvastatin  (CRESTOR ) 20 MG tablet, TAKE 1 TABLET BY MOUTH DAILY   tadalafil  (CIALIS ) 5 MG tablet, TAKE 1 TABLET BY MOUTH DAILY  Current Outpatient Medications (Respiratory):    fluticasone (FLONASE) 50 MCG/ACT nasal spray, Place 1 spray into both nostrils 2 (two) times daily.    loratadine (CLARITIN) 10 MG tablet, Take 10 mg by mouth daily.  Current Outpatient Medications (Analgesics):    allopurinol  (ZYLOPRIM ) 300 MG tablet, TAKE 1 TABLET BY MOUTH DAILY AS DIRECTED   aspirin  EC 81 MG tablet, Take 1 tablet (81 mg total) by mouth daily.  Current Outpatient Medications (Other):    carbamide peroxide (DEBROX) 6.5 % OTIC solution, Place 5 drops into both ears daily as needed.   pantoprazole  (PROTONIX ) 20 MG tablet, TAKE 1 TABLET BY MOUTH DAILY BEFORE BREAKFAST   PRESCRIPTION MEDICATION, Allergy shots every other week   tiZANidine  (ZANAFLEX ) 4 MG tablet, Take 1 tablet (4 mg total) by mouth Nightly.   Vitamin D , Ergocalciferol , (DRISDOL ) 1.25 MG (50000 UNIT) CAPS capsule, TAKE ONE CAPSULE BY MOUTH EVERY SEVEN  DAYS   Reviewed prior external information including notes and imaging from  primary care provider As well as notes that were available from care everywhere and other healthcare systems.  Past medical  history, social, surgical and family history all reviewed in electronic medical record.  No pertanent information unless stated regarding to the chief complaint.   Review of Systems:  No headache, visual changes, nausea, vomiting, diarrhea, constipation, dizziness, abdominal pain, skin rash, fevers, chills, night sweats, weight loss, swollen lymph nodes, body aches, joint swelling, chest pain, shortness of breath, mood changes. POSITIVE muscle aches  Objective  Blood pressure 120/84, pulse (!) 49, height 5' 10 (1.778 m), weight 173 lb (78.5 kg).   General: No apparent distress alert and oriented x3 mood and affect normal, dressed appropriately.  HEENT: Pupils equal, extraocular movements intact  Respiratory: Patient's speak in full sentences and does not appear short of breath  Cardiovascular: No lower extremity edema, non tender, no erythema  Knee exam shows does have swelling noted of the left knee.  Seems to be in the patellofemoral as well as in the popliteal area patient has no instability.  No tenderness only with compression.  Able to ambulate relatively well.   Procedure: Real-time Ultrasound Guided Injection of left knee Device: GE Logiq Q7 Ultrasound guided injection is preferred based studies that show increased duration, increased effect, greater accuracy, decreased procedural pain, increased response rate, and decreased cost with ultrasound guided versus blind injection.  Verbal informed consent obtained.  Time-out conducted.  Noted no overlying erythema, induration, or other signs of local infection.  Skin prepped in a sterile fashion.  Local anesthesia: Topical Ethyl chloride.  With sterile technique and under real time ultrasound guidance: With a 22-gauge 2 inch needle  patient was injected with 4 cc of 0.5% Marcaine and aspirated about 25 cc of straw-colored fluid then injected 1 cc of Kenalog 40 mg/dL. This was from a posterior approach.  Completed without difficulty  Pain immediately resolved suggesting accurate placement of the medication.  Advised to call if fevers/chills, erythema, induration, drainage, or persistent bleeding.  Images permanently stored and available for review in the ultrasound unit.  Impression: Technically successful ultrasound guided injection.   Impression and Recommendations:    The above documentation has been reviewed and is accurate and complete Stanley CHRISTELLA Sharps, DO        [1]  Allergies Allergen Reactions   Teriflunomide Other (See Comments)   Tamiflu  [Oseltamivir ] Rash    Mild rash 2018   "

## 2024-09-03 NOTE — Telephone Encounter (Signed)
Patient placed on the cancellation list

## 2024-09-04 ENCOUNTER — Other Ambulatory Visit: Payer: Self-pay | Admitting: Cardiology

## 2024-09-04 ENCOUNTER — Ambulatory Visit

## 2024-09-04 ENCOUNTER — Other Ambulatory Visit: Payer: Self-pay

## 2024-09-04 ENCOUNTER — Encounter: Payer: Self-pay | Admitting: Family Medicine

## 2024-09-04 ENCOUNTER — Ambulatory Visit: Admitting: Family Medicine

## 2024-09-04 VITALS — BP 120/84 | HR 49 | Ht 70.0 in | Wt 173.0 lb

## 2024-09-04 DIAGNOSIS — M25562 Pain in left knee: Secondary | ICD-10-CM

## 2024-09-04 DIAGNOSIS — M7122 Synovial cyst of popliteal space [Baker], left knee: Secondary | ICD-10-CM

## 2024-09-04 DIAGNOSIS — E7800 Pure hypercholesterolemia, unspecified: Secondary | ICD-10-CM

## 2024-09-04 NOTE — Assessment & Plan Note (Signed)
 Aspiration done today, tolerated the procedure well, discussed icing regimen and home exercises, discussed which activities to do and which ones to avoid.  Increase activity slowly.  Follow-up again in follow-up again in 4 weeks.  Patellofemoral arthritis is noted and we will get repeat x-rays.  History of gout and is continuing on the allopurinol .

## 2024-09-04 NOTE — Patient Instructions (Addendum)
 Xray today Knee compression Ice 20 min 2x a day PF exercises See me again at your next scheduled visit

## 2024-09-06 MED ORDER — ROSUVASTATIN CALCIUM 20 MG PO TABS: 20.0000 mg | ORAL_TABLET | Freq: Every day | ORAL | 0 refills | Status: DC

## 2024-09-09 ENCOUNTER — Ambulatory Visit: Payer: Self-pay | Admitting: Family Medicine

## 2024-09-09 NOTE — Telephone Encounter (Signed)
 Left message for patient to call back. Patient is over due for an office visit with Dr. Lonni.

## 2024-09-12 NOTE — Telephone Encounter (Signed)
 Patient returning call. Scheduled with Dr. Lonni 4/29.

## 2024-09-25 ENCOUNTER — Ambulatory Visit: Admitting: Family Medicine

## 2024-12-11 ENCOUNTER — Ambulatory Visit (HOSPITAL_BASED_OUTPATIENT_CLINIC_OR_DEPARTMENT_OTHER): Admitting: Cardiology
